# Patient Record
Sex: Male | Born: 1935 | Race: Black or African American | Hispanic: No | Marital: Single | Smoking: Former smoker
Health system: Southern US, Community
[De-identification: ages and names within clinical notes are randomized; demographics above are authoritative.]

## PROBLEM LIST (undated history)

## (undated) DIAGNOSIS — E785 Hyperlipidemia, unspecified: Secondary | ICD-10-CM

## (undated) DIAGNOSIS — H9193 Unspecified hearing loss, bilateral: Secondary | ICD-10-CM

## (undated) DIAGNOSIS — N19 Unspecified kidney failure: Secondary | ICD-10-CM

## (undated) DIAGNOSIS — I10 Essential (primary) hypertension: Secondary | ICD-10-CM

## (undated) DIAGNOSIS — I619 Nontraumatic intracerebral hemorrhage, unspecified: Secondary | ICD-10-CM

## (undated) DIAGNOSIS — R41841 Cognitive communication deficit: Secondary | ICD-10-CM

## (undated) DIAGNOSIS — N289 Disorder of kidney and ureter, unspecified: Secondary | ICD-10-CM

## (undated) DIAGNOSIS — E119 Type 2 diabetes mellitus without complications: Secondary | ICD-10-CM

## (undated) DIAGNOSIS — N390 Urinary tract infection, site not specified: Secondary | ICD-10-CM

## (undated) DIAGNOSIS — M6281 Muscle weakness (generalized): Secondary | ICD-10-CM

## (undated) DIAGNOSIS — D4959 Neoplasm of unspecified behavior of other genitourinary organ: Secondary | ICD-10-CM

## (undated) DIAGNOSIS — H409 Unspecified glaucoma: Secondary | ICD-10-CM

## (undated) DIAGNOSIS — G9340 Encephalopathy, unspecified: Secondary | ICD-10-CM

## (undated) DIAGNOSIS — R1310 Dysphagia, unspecified: Secondary | ICD-10-CM

## (undated) DIAGNOSIS — R600 Localized edema: Secondary | ICD-10-CM

## (undated) DIAGNOSIS — H547 Unspecified visual loss: Secondary | ICD-10-CM

## (undated) DIAGNOSIS — R339 Retention of urine, unspecified: Secondary | ICD-10-CM

## (undated) DIAGNOSIS — R609 Edema, unspecified: Secondary | ICD-10-CM

## (undated) DIAGNOSIS — R269 Unspecified abnormalities of gait and mobility: Secondary | ICD-10-CM

## (undated) HISTORY — DX: Hyperlipidemia, unspecified: E78.5

## (undated) HISTORY — DX: Unspecified hearing loss, bilateral: H91.93

## (undated) HISTORY — DX: Unspecified abnormalities of gait and mobility: R26.9

## (undated) HISTORY — DX: Unspecified visual loss: H54.7

## (undated) HISTORY — DX: Localized edema: R60.0

## (undated) HISTORY — PX: EYE SURGERY: SHX253

## (undated) HISTORY — DX: Unspecified glaucoma: H40.9

## (undated) HISTORY — DX: Edema, unspecified: R60.9

---

## 1998-02-06 ENCOUNTER — Ambulatory Visit (HOSPITAL_COMMUNITY): Admission: RE | Admit: 1998-02-06 | Discharge: 1998-02-06 | Payer: Self-pay | Admitting: Family Medicine

## 1998-11-25 ENCOUNTER — Encounter: Admission: RE | Admit: 1998-11-25 | Discharge: 1999-02-23 | Payer: Self-pay | Admitting: *Deleted

## 2000-05-12 ENCOUNTER — Encounter: Payer: Self-pay | Admitting: *Deleted

## 2000-05-12 ENCOUNTER — Encounter: Admission: RE | Admit: 2000-05-12 | Discharge: 2000-05-12 | Payer: Self-pay | Admitting: *Deleted

## 2000-06-04 ENCOUNTER — Ambulatory Visit (HOSPITAL_COMMUNITY): Admission: RE | Admit: 2000-06-04 | Discharge: 2000-06-04 | Payer: Self-pay | Admitting: Gastroenterology

## 2002-05-16 ENCOUNTER — Encounter: Admission: RE | Admit: 2002-05-16 | Discharge: 2002-05-16 | Payer: Self-pay | Admitting: Internal Medicine

## 2002-05-16 ENCOUNTER — Encounter: Payer: Self-pay | Admitting: Internal Medicine

## 2002-06-09 ENCOUNTER — Encounter: Admission: RE | Admit: 2002-06-09 | Discharge: 2002-06-09 | Payer: Self-pay | Admitting: Internal Medicine

## 2002-06-09 ENCOUNTER — Encounter: Payer: Self-pay | Admitting: Internal Medicine

## 2003-02-22 ENCOUNTER — Encounter: Admission: RE | Admit: 2003-02-22 | Discharge: 2003-02-22 | Payer: Self-pay | Admitting: Internal Medicine

## 2003-02-22 ENCOUNTER — Encounter: Payer: Self-pay | Admitting: Internal Medicine

## 2007-09-07 ENCOUNTER — Encounter: Admission: RE | Admit: 2007-09-07 | Discharge: 2007-09-07 | Payer: Self-pay | Admitting: Internal Medicine

## 2010-12-19 NOTE — Procedures (Signed)
[facility]  Patient:    Kent Mayo, Kent Mayo                    MRN: [id number] Proc. Date: 06/04/00 Adm. Date:  [id number] Attending:  Deneen Harts CC:         Georg Ruddle. Viviann Spare, M.D.   Procedure Report  PROCEDURE:  Colonoscopy.  INDICATIONS:  A 77 year old African-American male undergoing colonoscopy for neoplasia surveillance.  Prior history of sigmoidoscopy with findings of diverticulosis.  Patient with progressive constipation.  Undergoing colonoscopy for neoplasia surveillance.  Family history of colon.  Brother recently deceased with this diagnosis.  DESCRIPTION OF PROCEDURE:  After reviewing the nature of the procedure with the patient, including potential risks and complications, including hemorrhage and perforation, informed consent was obtained was signed.  The patient as premedicated, receiving IV sedation administered IV in divided doses prior to and during the course of the procedure, totalling Versed 5 mg and fentanyl 50 mcg IV.  Using an Olympus standard video colonoscope, the rectum was intubated after digital examination revealed decreased anorectal sphincter tone, but without evidence of other abnormality.  The scope was inserted and easily advanced around the entire length of the colon to the cecum, identified by the appendiceal orifice and ileocecal valve.  Preparation was good throughout. The scope was slowly withdrawn with careful inspection of the entire colon in a retrograde manner, including retroflexed view in the rectal vault.  No neoplasia was identified.  Extensive sigmoid diverticulosis was present. Melanosis coli was present limited to the rectum and mild in extent.  The retroflexed view in the rectal vault was normal.  The colon was decompressed. The scope was withdrawn.  The patient tolerated the procedure without difficulty, being maintained on Datascope monitor and low-flow oxygen throughout.  TIME:   One.  TECHNICAL:  One.  PREPARATION:  Two.  TOTAL SCORE:  Four.  ASSESSMENT: 1. Diverticulosis, extensive sigmoid involvement. 2. Melanosis coli 3. No colorectal neoplasia.  RECOMMENDATIONS: 1. Repeat colonoscopy in five years for ongoing neoplasia surveillance. 2. Annual Hemoccult. 3. MiraLax one capsule in 8-10 fluid ounces of water or juice nightly.    Titrate to achieve daily bowel action. DD:  06/04/00 TD:  06/06/00 Job: [id number]

## 2012-07-22 ENCOUNTER — Other Ambulatory Visit (HOSPITAL_COMMUNITY): Payer: Self-pay | Admitting: Cardiovascular Disease

## 2012-07-22 DIAGNOSIS — R609 Edema, unspecified: Secondary | ICD-10-CM

## 2012-07-25 ENCOUNTER — Ambulatory Visit (HOSPITAL_COMMUNITY)
Admission: RE | Admit: 2012-07-25 | Discharge: 2012-07-25 | Disposition: A | Discharge status: Home / Self Care | Payer: Medicare Other | Source: Ambulatory Visit | Attending: Cardiovascular Disease | Admitting: Cardiovascular Disease

## 2012-07-25 DIAGNOSIS — R609 Edema, unspecified: Secondary | ICD-10-CM | POA: Insufficient documentation

## 2012-07-25 NOTE — Progress Notes (Signed)
BLE venous duplex completed. Kent Mayo  

## 2013-02-22 ENCOUNTER — Observation Stay (HOSPITAL_COMMUNITY): Payer: Medicare Other

## 2013-02-22 ENCOUNTER — Inpatient Hospital Stay (HOSPITAL_COMMUNITY)
Admission: EM | Admit: 2013-02-22 | Discharge: 2013-02-28 | DRG: 066 | Disposition: A | Discharge status: Skilled Nursing Facility | Payer: Medicare Other | Attending: Internal Medicine | Admitting: Internal Medicine

## 2013-02-22 ENCOUNTER — Encounter (HOSPITAL_COMMUNITY): Payer: Self-pay | Admitting: *Deleted

## 2013-02-22 DIAGNOSIS — E119 Type 2 diabetes mellitus without complications: Secondary | ICD-10-CM

## 2013-02-22 DIAGNOSIS — I613 Nontraumatic intracerebral hemorrhage in brain stem: Secondary | ICD-10-CM | POA: Diagnosis present

## 2013-02-22 DIAGNOSIS — E785 Hyperlipidemia, unspecified: Secondary | ICD-10-CM

## 2013-02-22 DIAGNOSIS — I619 Nontraumatic intracerebral hemorrhage, unspecified: Principal | ICD-10-CM | POA: Diagnosis present

## 2013-02-22 DIAGNOSIS — R42 Dizziness and giddiness: Secondary | ICD-10-CM

## 2013-02-22 DIAGNOSIS — H548 Legal blindness, as defined in USA: Secondary | ICD-10-CM | POA: Diagnosis present

## 2013-02-22 DIAGNOSIS — E7800 Pure hypercholesterolemia, unspecified: Secondary | ICD-10-CM | POA: Diagnosis present

## 2013-02-22 DIAGNOSIS — Z8673 Personal history of transient ischemic attack (TIA), and cerebral infarction without residual deficits: Secondary | ICD-10-CM

## 2013-02-22 DIAGNOSIS — I10 Essential (primary) hypertension: Secondary | ICD-10-CM

## 2013-02-22 DIAGNOSIS — H9190 Unspecified hearing loss, unspecified ear: Secondary | ICD-10-CM | POA: Diagnosis present

## 2013-02-22 HISTORY — DX: Essential (primary) hypertension: I10

## 2013-02-22 HISTORY — DX: Type 2 diabetes mellitus without complications: E11.9

## 2013-02-22 LAB — CBC
HCT: 42 % (ref 39.0–52.0)
Platelets: 240 10*3/uL (ref 150–400)
RDW: 13.2 % (ref 11.5–15.5)
WBC: 12.6 10*3/uL — ABNORMAL HIGH (ref 4.0–10.5)

## 2013-02-22 LAB — URINALYSIS, ROUTINE W REFLEX MICROSCOPIC
Hgb urine dipstick: NEGATIVE
Nitrite: NEGATIVE
Specific Gravity, Urine: 1.017 (ref 1.005–1.030)
Urobilinogen, UA: 1 mg/dL (ref 0.0–1.0)

## 2013-02-22 LAB — BASIC METABOLIC PANEL
Chloride: 105 mEq/L (ref 96–112)
Creatinine, Ser: 1.05 mg/dL (ref 0.50–1.35)
GFR calc Af Amer: 73 mL/min — ABNORMAL LOW (ref >90)

## 2013-02-22 LAB — POCT I-STAT TROPONIN I: Troponin i, poc: 0.01 ng/mL (ref 0.00–0.08)

## 2013-02-22 LAB — HEPATIC FUNCTION PANEL
Albumin: 3.8 g/dL (ref 3.5–5.2)
Total Protein: 7.6 g/dL (ref 6.0–8.3)

## 2013-02-22 MED ORDER — ONDANSETRON HCL 4 MG/2ML IJ SOLN: 4.0000 mg | Freq: Three times a day (TID) | INTRAMUSCULAR | Status: DC | PRN

## 2013-02-22 MED ORDER — ALBUTEROL SULFATE (5 MG/ML) 0.5% IN NEBU
2.5000 mg | INHALATION_SOLUTION | RESPIRATORY_TRACT | Status: AC
  Administered 2013-02-23: 2.5 mg via RESPIRATORY_TRACT
  Filled 2013-02-22 (×3): qty 0.5

## 2013-02-22 NOTE — ED Provider Notes (Signed)
History    CSN: [id number] Arrival date & time 02/22/13  1652  None    Chief Complaint  Patient presents with  . Dizziness   (Consider location/radiation/quality/duration/timing/severity/associated sxs/prior Treatment) HPI  Kent Mayo is a 79 y.o. male with past medical history significant for non-insulin-dependent diabetes, hypertension and high cholesterol complaining of onset of dizziness (patient denies vertigo, endorses a lightheaded sensation) onset this a.m. awaking. Lightheaded sensation has been constant, he cannot identify any exacerbating or alleviating factors. He has had no prior similar episodes. Patient had single episode of nonbloody, nonbilious, non coffee ground emesis while in the ED. Patient denies chest pain, palpitations, shortness of breath, change in bowel or bladder habits, pain, fever, rash, dysarthria, ataxia, dysphagia.. Patient does endorse a lower extremity leg swelling this typical for his baseline. Patient denies any prior cardiac history.   PCP Rudean Hitt  Past Medical History  Diagnosis Date  . Diabetes mellitus without complication   . Hypertension    History reviewed. No pertinent past surgical history. No family history on file. History  Substance Use Topics  . Smoking status: Never Smoker   . Smokeless tobacco: Not on file  . Alcohol Use: No    Review of Systems  Constitutional:       Negative except as described in HPI  HENT:       Negative except as described in HPI  Respiratory:       Negative except as described in HPI  Cardiovascular:       Negative except as described in HPI  Gastrointestinal:       Negative except as described in HPI  Genitourinary:       Negative except as described in HPI  Musculoskeletal:       Negative except as described in HPI  Skin:       Negative except as described in HPI  Neurological:       Negative except as described in HPI  All other systems reviewed and are negative.    Allergies   Review of patient's allergies indicates no known allergies.  Home Medications   Current Outpatient Rx  Name  Route  Sig  Dispense  Refill  . Aliskiren-Hydrochlorothiazide (TEKTURNA HCT) 300-25 MG TABS   Oral   Take 1 tablet by mouth daily.         Marland Kitchen linagliptin (TRADJENTA) 5 MG TABS tablet   Oral   Take 5 mg by mouth daily.         . nebivolol (BYSTOLIC) 5 MG tablet   Oral   Take 5 mg by mouth daily.         . rosuvastatin (CRESTOR) 10 MG tablet   Oral   Take 10 mg by mouth daily.         . verapamil (CALAN-SR) 240 MG CR tablet   Oral   Take 240 mg by mouth at bedtime.          BP 148/63  Pulse 60  Temp(Src) 97.8 F (36.6 C) (Oral)  Resp 18  SpO2 95% Physical Exam  Nursing note and vitals reviewed. Constitutional: He is oriented to person, place, and time. He appears well-developed and well-nourished. No distress.  HENT:  Head: Normocephalic.  Mouth/Throat: Oropharynx is clear and moist.  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  Right eye has asymmetric pupils, history of cataract surgery.  Eyes: Conjunctivae and EOM are normal. Pupils are equal, round,  and reactive to light.  Neck: Normal range of motion. No JVD present.  Cardiovascular: Normal rate, regular rhythm and intact distal pulses.   Pulmonary/Chest: Effort normal and breath sounds normal. No stridor. No respiratory distress. He has no wheezes. He has no rales. He exhibits no tenderness.  Abdominal: Soft. Bowel sounds are normal. He exhibits no distension and no mass. There is no tenderness. There is no rebound and no guarding.  Musculoskeletal: Normal range of motion. He exhibits edema.  4+ pitting edema to proximal shin. Equal bilaterally, no warmth or induration. There are chronic skin changes.  Neurological: He is alert and oriented to person, place, and time.  Follows commands, Goal oriented speech, Strength is 5 out of 5x4 extremities,  cranial nerves are grossly intact. Sensation is grossly intact.   Psychiatric: He has a normal mood and affect.    ED Course  Procedures (including critical care time) Labs Reviewed  BASIC METABOLIC PANEL - Abnormal; Notable for the following:    Glucose, Bld 184 (*)    GFR calc non Af Amer 63 (*)    GFR calc Af Amer 73 (*)    All other components within normal limits  CBC - Abnormal; Notable for the following:    WBC 12.6 (*)    All other components within normal limits   No results found.   Date: 02/22/2013 20:32  Rate: 56  Rhythm: sinus bradycardia  QRS Axis: left  Intervals: PR prolonged  ST/T Wave abnormalities: nonspecific ST changes  Conduction Disutrbances:first-degree A-V block   Narrative Interpretation: Upsloping ST elevation in the inferior leads, T-wave inversion in aVL  Old EKG Reviewed: none available   Date: 02/22/2013 21:37  Rate: 57  Rhythm: sinus bradycardia  QRS Axis: left  Intervals: PR prolonged  ST/T Wave abnormalities: nonspecific ST changes  Conduction Disutrbances:first-degree A-V block   Narrative Interpretation: Upsloping ST elevation in the inferior leads, T-wave inversion in aVL  Old EKG Reviewed: none available     1. Light headed     MDM   Filed Vitals:   02/22/13 1658 02/22/13 1930  BP: 148/63 141/58  Pulse: 60 51  Temp: 97.8 F (36.6 C)   TempSrc: Oral   Resp: 18   SpO2: 95% 96%     Kent Mayo is a 79 y.o. male with lightheaded sensation acute onset this a.m. This does not appear to be vertiginous. Patient has no cerebellar signs on neuro exam. EKG shows upsloping ST elevation in the inferior leads, there are no is no prior EKG to compare this to. Troponin is negative. Repeat EKG showed similar pattern. EKG reviewed and discussed with Dr. Jeraldine Loots.  Blood work is unremarkable. Patient has a hyperglycemia with no ketosis or anion gap. He has a mild leukocytosis of 12.6.  This is a shared visit with attending Dr.  Jeraldine Loots who agrees that patient is appropriate for observation admission.  Pt will be admitted to Tele bed by Dr. Toniann Fail who requests  CT head and CXR   Wynetta Emery, PA-C 02/23/13 [phone]

## 2013-02-22 NOTE — ED Notes (Signed)
Pt states went to sleep and woke up feeling dizzy about one hour ago and just feels dizzy.  No other neuro deficits

## 2013-02-23 ENCOUNTER — Encounter (HOSPITAL_COMMUNITY): Payer: Self-pay | Admitting: Internal Medicine

## 2013-02-23 ENCOUNTER — Observation Stay (HOSPITAL_COMMUNITY): Payer: Medicare Other

## 2013-02-23 DIAGNOSIS — I369 Nonrheumatic tricuspid valve disorder, unspecified: Secondary | ICD-10-CM

## 2013-02-23 DIAGNOSIS — E119 Type 2 diabetes mellitus without complications: Secondary | ICD-10-CM | POA: Diagnosis present

## 2013-02-23 DIAGNOSIS — E785 Hyperlipidemia, unspecified: Secondary | ICD-10-CM | POA: Diagnosis present

## 2013-02-23 DIAGNOSIS — R42 Dizziness and giddiness: Secondary | ICD-10-CM

## 2013-02-23 DIAGNOSIS — I10 Essential (primary) hypertension: Secondary | ICD-10-CM | POA: Diagnosis present

## 2013-02-23 DIAGNOSIS — R609 Edema, unspecified: Secondary | ICD-10-CM

## 2013-02-23 LAB — GLUCOSE, CAPILLARY: Glucose-Capillary: 107 mg/dL — ABNORMAL HIGH (ref 70–99)

## 2013-02-23 LAB — CBC
MCH: 31.1 pg (ref 26.0–34.0)
MCV: 89.2 fL (ref 78.0–100.0)
Platelets: 243 10*3/uL (ref 150–400)
RDW: 13.2 % (ref 11.5–15.5)
WBC: 11.1 10*3/uL — ABNORMAL HIGH (ref 4.0–10.5)

## 2013-02-23 LAB — D-DIMER, QUANTITATIVE: D-Dimer, Quant: 0.56 ug/mL-FEU — ABNORMAL HIGH (ref 0.00–0.48)

## 2013-02-23 LAB — TROPONIN I
Troponin I: <0.3 ng/mL (ref <0.30)
Troponin I: <0.3 ng/mL (ref <0.30)

## 2013-02-23 LAB — CREATININE, SERUM: Creatinine, Ser: 0.88 mg/dL (ref 0.50–1.35)

## 2013-02-23 MED ORDER — NEBIVOLOL HCL 5 MG PO TABS
5.0000 mg | ORAL_TABLET | Freq: Every day | ORAL | Status: DC
  Administered 2013-02-25 – 2013-02-28 (×4): 5 mg via ORAL
  Filled 2013-02-23 (×6): qty 1

## 2013-02-23 MED ORDER — ONDANSETRON HCL 4 MG/2ML IJ SOLN: 4.0000 mg | Freq: Four times a day (QID) | INTRAMUSCULAR | Status: DC | PRN

## 2013-02-23 MED ORDER — ALISKIREN FUMARATE 150 MG PO TABS
300.0000 mg | ORAL_TABLET | Freq: Every day | ORAL | Status: DC
  Administered 2013-02-23 – 2013-02-28 (×6): 300 mg via ORAL
  Filled 2013-02-23 (×6): qty 2

## 2013-02-23 MED ORDER — HYDROCHLOROTHIAZIDE 25 MG PO TABS
25.0000 mg | ORAL_TABLET | Freq: Every day | ORAL | Status: DC
  Administered 2013-02-23 – 2013-02-28 (×6): 25 mg via ORAL
  Filled 2013-02-23 (×6): qty 1

## 2013-02-23 MED ORDER — SODIUM CHLORIDE 0.9 % IJ SOLN
3.0000 mL | Freq: Two times a day (BID) | INTRAMUSCULAR | Status: DC
  Administered 2013-02-25 – 2013-02-27 (×4): 3 mL via INTRAVENOUS

## 2013-02-23 MED ORDER — INSULIN ASPART 100 UNIT/ML ~~LOC~~ SOLN
0.0000 [IU] | Freq: Three times a day (TID) | SUBCUTANEOUS | Status: DC
  Administered 2013-02-23 – 2013-02-24 (×4): 2 [IU] via SUBCUTANEOUS
  Administered 2013-02-24: 1 [IU] via SUBCUTANEOUS
  Administered 2013-02-25: 3 [IU] via SUBCUTANEOUS
  Administered 2013-02-25 – 2013-02-27 (×6): 2 [IU] via SUBCUTANEOUS
  Administered 2013-02-28 (×2): 1 [IU] via SUBCUTANEOUS

## 2013-02-23 MED ORDER — SODIUM CHLORIDE 0.9 % IJ SOLN
3.0000 mL | Freq: Two times a day (BID) | INTRAMUSCULAR | Status: DC
  Administered 2013-02-23 – 2013-02-28 (×11): 3 mL via INTRAVENOUS

## 2013-02-23 MED ORDER — ATORVASTATIN CALCIUM 20 MG PO TABS
20.0000 mg | ORAL_TABLET | Freq: Every day | ORAL | Status: DC
  Administered 2013-02-23 – 2013-02-27 (×5): 20 mg via ORAL
  Filled 2013-02-23 (×6): qty 1

## 2013-02-23 MED ORDER — ACETAMINOPHEN 325 MG PO TABS: 650.0000 mg | ORAL_TABLET | Freq: Four times a day (QID) | ORAL | Status: DC | PRN

## 2013-02-23 MED ORDER — ALISKIREN-HYDROCHLOROTHIAZIDE 300-25 MG PO TABS: 1.0000 | ORAL_TABLET | Freq: Every day | ORAL | Status: DC

## 2013-02-23 MED ORDER — VERAPAMIL HCL ER 240 MG PO TBCR
240.0000 mg | EXTENDED_RELEASE_TABLET | Freq: Every day | ORAL | Status: DC
  Administered 2013-02-24 – 2013-02-27 (×4): 240 mg via ORAL
  Filled 2013-02-23 (×7): qty 1

## 2013-02-23 MED ORDER — ONDANSETRON HCL 4 MG PO TABS: 4.0000 mg | ORAL_TABLET | Freq: Four times a day (QID) | ORAL | Status: DC | PRN

## 2013-02-23 MED ORDER — ACETAMINOPHEN 650 MG RE SUPP: 650.0000 mg | Freq: Four times a day (QID) | RECTAL | Status: DC | PRN

## 2013-02-23 MED ORDER — ENOXAPARIN SODIUM 60 MG/0.6ML ~~LOC~~ SOLN
60.0000 mg | SUBCUTANEOUS | Status: DC
  Administered 2013-02-23 – 2013-02-24 (×2): 60 mg via SUBCUTANEOUS
  Filled 2013-02-23 (×2): qty 0.6

## 2013-02-23 MED ORDER — LINAGLIPTIN 5 MG PO TABS
5.0000 mg | ORAL_TABLET | Freq: Every day | ORAL | Status: DC
  Administered 2013-02-23 – 2013-02-28 (×6): 5 mg via ORAL
  Filled 2013-02-23 (×6): qty 1

## 2013-02-23 NOTE — ED Provider Notes (Signed)
  This was a shared visit with a mid-level provided (NP or PA).  Throughout the patient's course I was available for consultation/collaboration.  I saw the ECG (if appropriate), relevant labs and studies - I agree with the interpretation.  On my exam the patient was in no distress.  However, with his description of pneumonia or syncope he was admitted for further evaluation and management      Gerhard Munch, MD 02/23/13 1439

## 2013-02-23 NOTE — Progress Notes (Signed)
UR Completed.  Karon Heckendorn Jane [phone] 02/23/2013  

## 2013-02-23 NOTE — Care Management Note (Signed)
    Page 1 of 2   02/28/2013     3:35:47 PM   CARE MANAGEMENT NOTE 02/28/2013  Patient:  Kent Mayo, Kent Mayo   Account Number:  [id number]  Date Initiated:  02/23/2013  Documentation initiated by:  Sedra Morfin  Subjective/Objective Assessment:   PT ADM ON 02/22/13 WITH DIZZINESS, WEAKNESS.  PTA, PT LIVES ALONE, AND HAS BEEN INDEPENDENT.     Action/Plan:   P.T.  CONSULT TODAY; RECOMMENDATION IS FOR SNF PLACEMENT. WILL CONSULT CSW TO FACILITATE POSSIBLE DC TO SNF WHEN MEDICALLY STABLE.   Anticipated DC Date:  02/28/2013   Anticipated DC Plan:  SKILLED NURSING FACILITY  In-house referral  Clinical Social Worker      DC Planning Services  CM consult      Choice offered to / List presented to:             Status of service:  Completed, signed off Medicare Important Message given?   (If response is "NO", the following Medicare IM given date fields will be blank) Date Medicare IM given:   Date Additional Medicare IM given:    Discharge Disposition:  SKILLED NURSING FACILITY  Per UR Regulation:  Reviewed for med. necessity/level of care/duration of stay  If discussed at Long Length of Stay Meetings, dates discussed:    Comments:  02/28/13 Royce Stegman,RN,BSN [phone] PT DISCHARGED TODAY TO [facility], PER CSW ARRANGEMENTS.  02/27/13 Marcelle Bebout,RN,BSN [phone] PT MET IP CRITERIA SINCE 7/25 AND HAS 3 DAY IP STAY.  WILL BE ABLE TO GO TO SNF.  SPOKE WITH BROTHER JULIUS, AND HE STATES HE WILL SPEAK WITH PT ABOUT GOING TO SNF.  HE IS AGREEABLE TO THE PLAN, AND THINKS PT WILL BE AS WELL.  CSW TO FOLLOW UP WITH BROTHER THIS AM.  02/24/13 Ebony Rickel,RN,BSN [phone] SPOKE WITH PT'S BROTHER, JULIUS, AT BEDSIDE TO DISCUSS DC PLANS.  PT STATES HE TOO, IS CONCERNED ABOUT PT FALLING AT HOME ALONE.  HE HAS TRIED IN THE PAST TO GET PT TO AGREE TO ALF, WITHOUT SUCCESS.  HE CHECKS ON PT FREQUENTLY THROUGHOUT THE DAY.  PT HAS MEDICARE, AND AT THIS POINT IS OBSERVATION STATUS, SO IS NOT  ELIGIBLE FOR SNF PLACEMENT. BROTHER GIVEN LIST OF HOME CARE PROVIDERS AND PRIVATE DUTY SITTERS.  NOTED PT BEING WORKED UP FOR POSSIBLE STROKE. WILL FOLLOW UP WITH MED MGMT ON POSS CHANGE TO INPT STATUS DUE TO CHANGE IN MEDICAL CONDITION.

## 2013-02-23 NOTE — Evaluation (Signed)
Physical Therapy Evaluation Patient Details Name: Kent Mayo MRN: [id number] DOB: 1926/09/12 Today's Date: 02/23/2013 Time: 6578-4696 PT Time Calculation (min): 23 min  PT Assessment / Plan / Recommendation History of Present Illness  79 y.o. male admitted with dizziness, chronic LE edema. Orthostatics negative, CT head negative, Dopplers negative.  Clinical Impression  *Pt ambulated 150' with min A to negotiate obstacles 2* very poor vision. Pt reports living alone. May need higher level of care if family unable to assist. SNF vs. HHPT depending on progress and assistance available from family. Pt would benefit from acute PT to maximize safety and independence with mobility.  **    PT Assessment  Patient needs continued PT services    Follow Up Recommendations  SNF;Supervision for mobility/OOB    Does the patient have the potential to tolerate intense rehabilitation      Barriers to Discharge Decreased caregiver support      Equipment Recommendations  Rolling walker with 5" wheels    Recommendations for Other Services OT consult   Frequency Min 3X/week    Precautions / Restrictions Precautions Precautions: Fall Precaution Comments: very poor vision Restrictions Weight Bearing Restrictions: No   Pertinent Vitals/Pain *pt denies pain**      Mobility  Bed Mobility Bed Mobility: Not assessed Transfers Transfers: Sit to Stand;Stand to Sit Sit to Stand: 3: Mod assist;From bed Stand to Sit: 4: Min assist;To chair/3-in-1;With armrests Details for Transfer Assistance: VCs hand placement Ambulation/Gait Ambulation/Gait Assistance: 4: Min assist Ambulation Distance (Feet): 150 Feet Assistive device: Rolling walker Ambulation/Gait Assistance Details: Verbal and visual cues to negotiate obstacles 2* poor vision Gait Pattern: Step-through pattern    Exercises     PT Diagnosis: Generalized weakness;Difficulty walking  PT Problem List: Decreased mobility PT Treatment  Interventions: DME instruction;Gait training;Stair training;Therapeutic activities;Therapeutic exercise;Patient/family education     PT Goals(Current goals can be found in the care plan section) Acute Rehab PT Goals Patient Stated Goal: none stated PT Goal Formulation: With patient Time For Goal Achievement: 03/09/13 Potential to Achieve Goals: Good  Visit Information  Last PT Received On: 02/23/13 Assistance Needed: +1 History of Present Illness: 79 y.o. male admitted with dizziness, chronic LE edema. Orthostatics negative, CT head negative, Dopplers negative.       Prior Functioning  Home Living Family/patient expects to be discharged to:: Private residence Living Arrangements: Alone Type of Home: Apartment Home Access: Stairs to enter Entrance Stairs-Number of Steps: flight Home Equipment: Cane - single point Prior Function Level of Independence: Independent with assistive device(s) Communication Communication: HOH    Cognition  Cognition Arousal/Alertness: Awake/alert Behavior During Therapy: WFL for tasks assessed/performed Overall Cognitive Status: Within Functional Limits for tasks assessed    Extremity/Trunk Assessment Upper Extremity Assessment Upper Extremity Assessment: Overall WFL for tasks assessed Lower Extremity Assessment Lower Extremity Assessment: Overall WFL for tasks assessed Cervical / Trunk Assessment Cervical / Trunk Assessment: Normal   Balance Balance Balance Assessed: Yes Static Sitting Balance Static Sitting - Balance Support: Feet supported;Bilateral upper extremity supported Static Sitting - Level of Assistance: 7: Independent Static Sitting - Comment/# of Minutes: 2  End of Session PT - End of Session Equipment Utilized During Treatment: Gait belt Activity Tolerance: Patient tolerated treatment well Patient left: in chair;with chair alarm set;with call bell/phone within reach Nurse Communication: Mobility status  GP Functional  Assessment Tool Used: clinical judgement Functional Limitation: Mobility: Walking and moving around Mobility: Walking and Moving Around Current Status [phone]): At least 20 percent but less than 40  percent impaired, limited or restricted Mobility: Walking and Moving Around Goal Status [phone]): At least 1 percent but less than 20 percent impaired, limited or restricted   Tamala Ser 02/23/2013, 2:28 PM [phone]

## 2013-02-23 NOTE — Progress Notes (Signed)
[facility] PROGRESS NOTE  Kent Mayo ZOX:[id number] DOB: 1927-04-22 DOA: 02/22/2013 PCP: No primary provider on file.  Assessment/Plan: Dizziness - orthostatic changes negative. MRI brain could not be done because of metal in the eye.  and given the abnormal EKG cycle cardiac markers and check 2-D echo and also d-dimer. Closely observe in telemetry. Abnormal EKG (No old EKG to compare) - denies any chest pain.Cycle cardiac markers and check 2-D echo. Patient does not have any chest pain.  Diabetes mellitus type 2 - continue present medications. Chronic lower extremity edema - check Dopplers to rule out DVT. Hypertension - continue present medications. Hyperlipidemia - continue present medications.   Code Status: full code Family Communication: none at bedside Disposition Plan: pending.    Consultants:  none  Procedures:  MRI brain.  Antibiotics:  none  HPI/Subjective: No dizzines. No chest pain.   Objective: Filed Vitals:   02/23/13 0247 02/23/13 0256 02/23/13 0426 02/23/13 0455  BP: 189/83 214/94  153/68  Pulse: 71   74  Temp:    97.8 F (36.6 C)  TempSrc:    Oral  Resp: 18   18  Height:      Weight:      SpO2: 98% 95% 96% 97%    Intake/Output Summary (Last 24 hours) at 02/23/13 1256 Last data filed at 02/23/13 1100  Gross per 24 hour  Intake    240 ml  Output    900 ml  Net   -660 ml   Filed Weights   02/23/13 0055 02/23/13 0128  Weight: 121.2 kg (267 lb 3.2 oz) 121.2 kg (267 lb 3.2 oz)    Exam:     Data Reviewed: Basic Metabolic Panel:  Recent Labs Lab 02/22/13 1713 02/23/13 0138  NA 138  --   K 3.8  --   CL 105  --   CO2 20  --   GLUCOSE 184*  --   BUN 20  --   CREATININE 1.05 0.88  CALCIUM 9.7  --    Liver Function Tests:  Recent Labs Lab 02/22/13 2040  AST 24  ALT 16  ALKPHOS 66  BILITOT 0.5  PROT 7.6  ALBUMIN 3.8   No results found for this basename: LIPASE, AMYLASE,  in the last 168 hours No results found  for this basename: AMMONIA,  in the last 168 hours CBC:  Recent Labs Lab 02/22/13 1713 02/23/13 0138  WBC 12.6* 11.1*  HGB 14.4 14.7  HCT 42.0 42.2  MCV 89.2 89.2  PLT 240 243   Cardiac Enzymes:  Recent Labs Lab 02/23/13 0138 02/23/13 0827  TROPONINI <0.30 <0.30   BNP (last 3 results)  Recent Labs  02/23/13 0138  PROBNP 212.4   CBG:  Recent Labs Lab 02/23/13 0717  GLUCAP 115*    No results found for this or any previous visit (from the past 240 hour(s)).   Studies: Dg Eye Foreign Body  02/23/2013   *RADIOLOGY REPORT*  Clinical Data: Question foreign body in the region orbits.  ORBITS FOR FOREIGN BODY - 2 VIEW  Comparison: None.  Findings: Prior right orbital surgery with prostatic structure in place.  Radiopaque structure left orbit.  Etiology indeterminate.  The patient is not safe for MR scanning until these objects have been cleared by surgical notes detailing what was placed and then clearing placed object as MR safe.  IMPRESSION: The patient is NOT cleared for MR imaging.  Please see above.   Original Report Authenticated By: Viviann Spare  Constance Goltz, M.D.   Dg Chest 2 View  02/22/2013   *RADIOLOGY REPORT*  Clinical Data: Shortness of breath.  CHEST - 2 VIEW  Comparison: Report of two-view chest x-ray 02/22/2003.  Findings: The heart is mildly enlarged.  The appearance is exaggerated by low lung volumes.  No focal airspace disease is present.  The visualized soft tissues and bony thorax are unremarkable.  IMPRESSION:  1.  Borderline cardiomegaly without failure. 2.  No acute cardiopulmonary disease.   Original Report Authenticated By: Marin Roberts, M.D.   Ct Head Wo Contrast  02/23/2013   *RADIOLOGY REPORT*  Clinical Data: Severe headache.  The patient woke up feeling dizzy 1 hour ago.  CT HEAD WITHOUT CONTRAST  Technique:  Contiguous axial images were obtained from the base of the skull through the vertex without contrast.  Comparison: Report of MRI brain without and  with contrast at [facility] 06/09/2002.  Findings: Moderate generalized atrophy and white matter disease is evident.  No acute cortical infarct, hemorrhage, or mass lesion is present.  The ventricles are proportionate to the degree of atrophy.  No significant extra-axial fluid collection is present.  The paranasal sinuses and mastoid air cells are clear.  The osseous skull is intact.  Scleral banding is present on the right.  A focal density along the left globe is small metallic fragment.  IMPRESSION:  1.  Age advanced atrophy and white matter disease likely reflects the sequelae of chronic microvascular ischemia. 2.  No acute intracranial abnormality.   Original Report Authenticated By: Marin Roberts, M.D.    Scheduled Meds: . aliskiren  300 mg Oral Daily   And  . hydrochlorothiazide  25 mg Oral Daily  . atorvastatin  20 mg Oral q1800  . enoxaparin (LOVENOX) injection  60 mg Subcutaneous Q24H  . insulin aspart  0-9 Units Subcutaneous TID WC  . linagliptin  5 mg Oral Daily  . nebivolol  5 mg Oral Daily  . sodium chloride  3 mL Intravenous Q12H  . sodium chloride  3 mL Intravenous Q12H  . verapamil  240 mg Oral QHS   Continuous Infusions:   Principal Problem:   Dizziness and giddiness Active Problems:   HTN (hypertension)   Diabetes mellitus   Hyperlipidemia        Kent Mayo  [facility] Pager [phone]. If 7PM-7AM, please contact night-coverage at [email or web address], password [facility] 02/23/2013, 12:56 PM  LOS: 1 day

## 2013-02-23 NOTE — H&P (Addendum)
[facility] History and Physical  Kent Mayo JYN:[id number] DOB: 06-Nov-1926 DOA: 02/22/2013  Referring physician: ER physician. PCP: No primary provider on file. Dr. Cindee Lame.  Chief Complaint: Dizziness.  HPI: Kent Mayo is a 79 y.o. male history of diabetes mellitus type 2, hypertension and hyperlipidemia started experiencing dizziness from yesterday morning. Patient states that he has been feeling dizzy on lying and standing. Denies any associated hearing loss or tinnitus or any loss of consciousness or palpitations. Denies any nausea vomiting abdominal pain chest pain or shortness of breath. In the ER patient was found to be not orthostatic. CT head was negative for anything acute. EKG shows sinus rhythm with ST-T changes which were concerning but upsloping and patient does not have any chest pain and troponins were negative. Patient has been admitted for further management. Patient also has chronic lower extremity edema.   Review of Systems: As presented in the history of presenting illness, rest negative.  Past Medical History  Diagnosis Date  . Diabetes mellitus without complication   . Hypertension    Past Surgical History  Procedure Laterality Date  . Eye surgery     Social History:  reports that he has never smoked. He does not have any smokeless tobacco history on file. He reports that he does not drink alcohol or use illicit drugs. home.  where does patient live--  can do ADLs.  Can patient participate in ADLs?  No Known Allergies  Family History  Problem Relation Age of Onset  . Hypertension Mother   . Hypertension Father   . Diabetes Mellitus II Brother       Prior to Admission medications   Medication Sig Start Date End Date Taking? Authorizing Provider  Aliskiren-Hydrochlorothiazide (TEKTURNA HCT) 300-25 MG TABS Take 1 tablet by mouth daily.   Yes Historical Provider, MD  linagliptin (TRADJENTA) 5 MG TABS tablet Take 5 mg by mouth daily.   Yes  Historical Provider, MD  nebivolol (BYSTOLIC) 5 MG tablet Take 5 mg by mouth daily.   Yes Historical Provider, MD  rosuvastatin (CRESTOR) 10 MG tablet Take 10 mg by mouth daily.   Yes Historical Provider, MD  verapamil (CALAN-SR) 240 MG CR tablet Take 240 mg by mouth at bedtime.   Yes Historical Provider, MD   Physical Exam: Filed Vitals:   02/22/13 2130 02/22/13 2200 02/23/13 0001 02/23/13 0055  BP: 148/68 140/82 138/83 189/83  Pulse: 54 55 60 64  Temp:    97.3 F (36.3 C)  TempSrc:      Resp: 18 19 18    Height:    6' (1.829 m)  Weight:    121.2 kg (267 lb 3.2 oz)  SpO2: 99% 97% 97% 98%     General:  Well-developed well-nourished.  Eyes: Anicteric no pallor. Left cornea is opaque.   ENT: No discharge from ears eyes nose mouth.  Neck:  no mass felt.   Cardiovascular:  S1-S2 heard.   Respiratory:  no rhonchi or crepitations.  Abdomen: Soft nontender bowel sounds present.  Skin:  chronic skin changes in the lower extremity.   Musculoskeletal:  bilateral lower extremity edema.   Psychiatric:  appears normal.   Neurologic: are e oriented to time place and person. Moves all extremities.   Labs on Admission:  Basic Metabolic Panel:  Recent Labs Lab 02/22/13 1713  NA 138  K 3.8  CL 105  CO2 20  GLUCOSE 184*  BUN 20  CREATININE 1.05  CALCIUM 9.7   Liver Function  Tests:  Recent Labs Lab 02/22/13 2040  AST 24  ALT 16  ALKPHOS 66  BILITOT 0.5  PROT 7.6  ALBUMIN 3.8   No results found for this basename: LIPASE, AMYLASE,  in the last 168 hours No results found for this basename: AMMONIA,  in the last 168 hours CBC:  Recent Labs Lab 02/22/13 1713  WBC 12.6*  HGB 14.4  HCT 42.0  MCV 89.2  PLT 240   Cardiac Enzymes: No results found for this basename: CKTOTAL, CKMB, CKMBINDEX, TROPONINI,  in the last 168 hours  BNP (last 3 results) No results found for this basename: PROBNP,  in the last 8760 hours CBG: No results found for this basename:  GLUCAP,  in the last 168 hours  Radiological Exams on Admission: Dg Chest 2 View  02/22/2013   *RADIOLOGY REPORT*  Clinical Data: Shortness of breath.  CHEST - 2 VIEW  Comparison: Report of two-view chest x-ray 02/22/2003.  Findings: The heart is mildly enlarged.  The appearance is exaggerated by low lung volumes.  No focal airspace disease is present.  The visualized soft tissues and bony thorax are unremarkable.  IMPRESSION:  1.  Borderline cardiomegaly without failure. 2.  No acute cardiopulmonary disease.   Original Report Authenticated By: Marin Roberts, M.D.   Ct Head Wo Contrast  02/23/2013   *RADIOLOGY REPORT*  Clinical Data: Severe headache.  The patient woke up feeling dizzy 1 hour ago.  CT HEAD WITHOUT CONTRAST  Technique:  Contiguous axial images were obtained from the base of the skull through the vertex without contrast.  Comparison: Report of MRI brain without and with contrast at [facility] 06/09/2002.  Findings: Moderate generalized atrophy and white matter disease is evident.  No acute cortical infarct, hemorrhage, or mass lesion is present.  The ventricles are proportionate to the degree of atrophy.  No significant extra-axial fluid collection is present.  The paranasal sinuses and mastoid air cells are clear.  The osseous skull is intact.  Scleral banding is present on the right.  A focal density along the left globe is small metallic fragment.  IMPRESSION:  1.  Age advanced atrophy and white matter disease likely reflects the sequelae of chronic microvascular ischemia. 2.  No acute intracranial abnormality.   Original Report Authenticated By: Marin Roberts, M.D.    EKG: Independently reviewed.  normal sinus rhythm with upgoing ST elevations in anterior leads. No old EKG to compare.  Assessment/Plan Principal Problem:   Dizziness and giddiness Active Problems:   HTN (hypertension)   Diabetes mellitus   Hyperlipidemia   1. Dizziness  - check orthostatic  changes. Check MRI brain and given the abnormal EKG cycle cardiac markers and check 2-D echo and also d-dimer. Closely observe in telemetry.  2. Abnormal EKG (No old EKG to compare)  - denies any chest pain.Cycle cardiac markers and check 2-D echo. Patient does not have any chest pain.  3. Diabetes mellitus type 2  - continue present medications.  4. Chronic lower extremity edema - check Dopplers to rule out DVT. 5. Hypertension - continue present medications. 6. Hyperlipidemia - continue present medications.    Code Status:  full code.   Family Communication:  none.   Disposition Plan:  admit for observation.     Lacye Mccarn N. [facility] Pager [phone].   If 7PM-7AM, please contact night-coverage [email or web address] Password [email or web address] 02/23/2013, 1:28 AM

## 2013-02-23 NOTE — Progress Notes (Signed)
  Echocardiogram 2D Echocardiogram has been performed.  Cathie Beams 02/23/2013, 9:54 AM

## 2013-02-23 NOTE — Progress Notes (Signed)
PT Cancellation Note  Patient Details Name: Kent Mayo MRN: [id number] DOB: Mar 12, 1927   Cancelled Treatment:    Reason Eval/Treat Not Completed: Medical issues which prohibited therapy (BLE Dopplers pending. Will follow. )   Tamala Ser 02/23/2013, 8:47 AM [phone]

## 2013-02-23 NOTE — Progress Notes (Signed)
Pt for MR of brain. Noted to have radiopaque foreign object in left eye. Per pt, had cataract surgery 5-6 yrs ago at [facility]. States since that surgery, has not had any vision in that eye. He is not sure exactly what was done then and does not appear to be aware there is retained foreign object in that eye since surgery. Denies pain in either eye/orbit at this time.  MRI was discontinued. Hx was communicated to reading Radiologist as well as ordering MD.

## 2013-02-23 NOTE — Progress Notes (Signed)
*  PRELIMINARY RESULTS* Vascular Ultrasound Lower extremity venous duplex has been completed.  Preliminary findings: Technically difficult study. No obvious evidence of DVT bilaterally.    Farrel Demark, RDMS, RVT  02/23/2013, 9:21 AM

## 2013-02-24 ENCOUNTER — Observation Stay (HOSPITAL_COMMUNITY): Payer: Medicare Other

## 2013-02-24 DIAGNOSIS — I613 Nontraumatic intracerebral hemorrhage in brain stem: Secondary | ICD-10-CM | POA: Diagnosis present

## 2013-02-24 DIAGNOSIS — I619 Nontraumatic intracerebral hemorrhage, unspecified: Principal | ICD-10-CM

## 2013-02-24 LAB — GLUCOSE, CAPILLARY
Glucose-Capillary: 154 mg/dL — ABNORMAL HIGH (ref 70–99)
Glucose-Capillary: 131 mg/dL — ABNORMAL HIGH (ref 70–99)
Glucose-Capillary: 179 mg/dL — ABNORMAL HIGH (ref 70–99)
Glucose-Capillary: 157 mg/dL — ABNORMAL HIGH (ref 70–99)

## 2013-02-24 NOTE — Progress Notes (Signed)
[facility] PROGRESS NOTE  Kent CLAUSEN ION:[id number] DOB: Nov 21, 1926 DOA: 02/22/2013 PCP: No primary provider on file.  Assessment/Plan: Dizziness - orthostatic changes negative. MRI brain could not be done because of metal in the eye, repeat CT done and shows small hemorrahge in the pons. Neurology consulted for further recommendations.   given the abnormal EKG, cardiac markers done and were negative.   2 d echo showed severe LVH.  Closely observe in telemetry. denies any chest paiN Diabetes mellitus type 2 - continue present medications.  CBG (last 3)   Recent Labs  02/23/13 2108 02/24/13 0659 02/24/13 1115  GLUCAP 107* 154* 131*     Chronic lower extremity edema -  Dopplers to rule out DVT DONE and negative. .  Hypertension - continue present medications.  Hyperlipidemia - continue present medications.   Code Status: full code Family Communication: none at bedside Disposition Plan: SNF placement.    Consultants:  neurology  Procedures:  CT head. Marland Kitchen  Antibiotics:  none  HPI/Subjective: No dizzines. No chest pain.   Objective: Filed Vitals:   02/23/13 2042 02/24/13 0404 02/24/13 0802 02/24/13 1100  BP: 148/69 168/71 162/61 158/78  Pulse: 56 55 68 61  Temp: 98.4 F (36.9 C) 98 F (36.7 C) 97.8 F (36.6 C) 97.9 F (36.6 C)  TempSrc: Oral Oral Oral Oral  Resp: 18 18 18 18   Height:      Weight:      SpO2: 100% 96% 99% 99%    Intake/Output Summary (Last 24 hours) at 02/24/13 1239 Last data filed at 02/24/13 0300  Gross per 24 hour  Intake    240 ml  Output    400 ml  Net   -160 ml   Filed Weights   02/23/13 0055 02/23/13 0128  Weight: 121.2 kg (267 lb 3.2 oz) 121.2 kg (267 lb 3.2 oz)    Exam: gen alert afebrile comfortable CVS s1s2  Lungs clear Abdomen soft NT ND BS+ Extremities : no pedal edema.     Data Reviewed: Basic Metabolic Panel:  Recent Labs Lab 02/22/13 1713 02/23/13 0138  NA 138  --   K 3.8  --   CL 105  --    CO2 20  --   GLUCOSE 184*  --   BUN 20  --   CREATININE 1.05 0.88  CALCIUM 9.7  --    Liver Function Tests:  Recent Labs Lab 02/22/13 2040  AST 24  ALT 16  ALKPHOS 66  BILITOT 0.5  PROT 7.6  ALBUMIN 3.8   No results found for this basename: LIPASE, AMYLASE,  in the last 168 hours No results found for this basename: AMMONIA,  in the last 168 hours CBC:  Recent Labs Lab 02/22/13 1713 02/23/13 0138  WBC 12.6* 11.1*  HGB 14.4 14.7  HCT 42.0 42.2  MCV 89.2 89.2  PLT 240 243   Cardiac Enzymes:  Recent Labs Lab 02/23/13 0138 02/23/13 0827 02/23/13 1350  TROPONINI <0.30 <0.30 <0.30   BNP (last 3 results)  Recent Labs  02/23/13 0138  PROBNP 212.4   CBG:  Recent Labs Lab 02/23/13 1440 02/23/13 1633 02/23/13 2108 02/24/13 0659 02/24/13 1115  GLUCAP 200* 180* 107* 154* 131*    No results found for this or any previous visit (from the past 240 hour(s)).   Studies: Dg Eye Foreign Body  02/23/2013   *RADIOLOGY REPORT*  Clinical Data: Question foreign body in the region orbits.  ORBITS FOR FOREIGN BODY - 2 VIEW  Comparison: None.  Findings: Prior right orbital surgery with prostatic structure in place.  Radiopaque structure left orbit.  Etiology indeterminate.  The patient is not safe for MR scanning until these objects have been cleared by surgical notes detailing what was placed and then clearing placed object as MR safe.  IMPRESSION: The patient is NOT cleared for MR imaging.  Please see above.   Original Report Authenticated By: [name], M.D.   Dg Chest 2 View  02/22/2013   *RADIOLOGY REPORT*  Clinical Data: Shortness of breath.  CHEST - 2 VIEW  Comparison: Report of two-view chest x-ray 02/22/2003.  Findings: The heart is mildly enlarged.  The appearance is exaggerated by low lung volumes.  No focal airspace disease is present.  The visualized soft tissues and bony thorax are unremarkable.  IMPRESSION:  1.  Borderline cardiomegaly without failure. 2.  No  acute cardiopulmonary disease.   Original Report Authenticated By: Marin Roberts, M.D.   Ct Head Wo Contrast  02/24/2013   *RADIOLOGY REPORT*  Clinical Data: Dizziness when lying down.  CT HEAD WITHOUT CONTRAST  Technique:  Contiguous axial images were obtained from the base of the skull through the vertex without contrast.  Comparison: 02/22/2013 CT.  Findings: Radiopaque structures in the region of the orbits consistent with prior surgical implant on the right and possible metallic structure on the left.  Mild exophthalmos.  Question artifact versus tiny 5mm hemorrhage superior left paracentral pons (series 2 image 11).  No other findings of intracranial hemorrhage or acute thrombotic infarct.  Global atrophy and small vessel disease type changes.  Vascular calcifications.  IMPRESSION: Question artifact versus tiny 5mm hemorrhage superior left paracentral pons (series 2 image 11).  This has been made a PRA call report utilizing dashboard call feature.   Original Report Authenticated By: [name], M.D.   Ct Head Wo Contrast  02/23/2013   *RADIOLOGY REPORT*  Clinical Data: Severe headache.  The patient woke up feeling dizzy 1 hour ago.  CT HEAD WITHOUT CONTRAST  Technique:  Contiguous axial images were obtained from the base of the skull through the vertex without contrast.  Comparison: Report of MRI brain without and with contrast at [facility] 06/09/2002.  Findings: Moderate generalized atrophy and white matter disease is evident.  No acute cortical infarct, hemorrhage, or mass lesion is present.  The ventricles are proportionate to the degree of atrophy.  No significant extra-axial fluid collection is present.  The paranasal sinuses and mastoid air cells are clear.  The osseous skull is intact.  Scleral banding is present on the right.  A focal density along the left globe is small metallic fragment.  IMPRESSION:  1.  Age advanced atrophy and white matter disease likely reflects the  sequelae of chronic microvascular ischemia. 2.  No acute intracranial abnormality.   Original Report Authenticated By: Marin Roberts, M.D.    Scheduled Meds: . aliskiren  300 mg Oral Daily   And  . hydrochlorothiazide  25 mg Oral Daily  . atorvastatin  20 mg Oral q1800  . enoxaparin (LOVENOX) injection  60 mg Subcutaneous Q24H  . insulin aspart  0-9 Units Subcutaneous TID WC  . linagliptin  5 mg Oral Daily  . nebivolol  5 mg Oral Daily  . sodium chloride  3 mL Intravenous Q12H  . sodium chloride  3 mL Intravenous Q12H  . verapamil  240 mg Oral QHS   Continuous Infusions:   Principal Problem:   Dizziness and giddiness Active Problems:  HTN (hypertension)   Diabetes mellitus   Hyperlipidemia        Tysheem Accardo  [facility] Pager [phone]. If 7PM-7AM, please contact night-coverage at [email or web address], password [facility] 02/24/2013, 12:39 PM  LOS: 2 days

## 2013-02-24 NOTE — Consult Note (Signed)
Referring Physician: Dr. Blake Divine    Chief Complaint:  Acute onset of vertigo.  HPI: Kent Mayo is an 79 y.o. male history of hypertension, hyperlipidemia and diabetes mellitus who experienced acute onset of severe vertigo on the afternoon of 02/22/2013. Patient subsequently had nausea and vomiting after arriving in the emergency room. No focal deficits were noted regarding his extremities. He had no changes in speech. CT scan of his head today showed small area of increased density involving the left pons indicative of possible acute hemorrhage. His NIH stroke score is 0. She's been taking aspirin 81 mg per day. He indicates he had a small stroke he thinks maybe 20 years ago.  LSN: 02/22/2013, afternoon. tPA Given: No: Beyond time under for treatment consideration mRankin:  Past Medical History  Diagnosis Date  . Diabetes mellitus without complication   . Hypertension     Family History  Problem Relation Age of Onset  . Hypertension Mother   . Hypertension Father   . Diabetes Mellitus II Brother      Medications:  I have reviewed the patient's current medications. Scheduled: . aliskiren  300 mg Oral Daily   And  . hydrochlorothiazide  25 mg Oral Daily  . atorvastatin  20 mg Oral q1800  . insulin aspart  0-9 Units Subcutaneous TID WC  . linagliptin  5 mg Oral Daily  . nebivolol  5 mg Oral Daily  . sodium chloride  3 mL Intravenous Q12H  . sodium chloride  3 mL Intravenous Q12H  . 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 ENT ROS: negative for - epistaxis, nasal discharge, oral lesions,  sore throat Allergy and Immunology ROS: negative for - hives or itchy/watery eyes Hematological and Lymphatic ROS: Positive for chronic lymphedema involving lower extremities Endocrine ROS: negative for - galactorrhea, hair pattern changes, polydipsia/polyuria or temperature intolerance Respiratory ROS: negative for - cough, hemoptysis, shortness of breath or wheezing Cardiovascular ROS: negative for - chest pain, dyspnea on exertion, edema or irregular heartbeat Gastrointestinal ROS: negative for - abdominal pain, diarrhea, hematemesis, nausea/vomiting or stool incontinence Genito-Urinary ROS: negative for - dysuria, hematuria, incontinence or urinary frequency/urgency Musculoskeletal ROS: negative for - joint swelling or muscular weakness Neurological ROS: as noted in HPI Dermatological ROS: Positive for chronic changes associated with chronic lymphedema  Physical Examination: Blood pressure 158/78, pulse 61, temperature 97.9 F (36.6 C), temperature source Oral, resp. rate 18, height 6' (1.829 m), weight 121.2 kg (267 lb 3.2 oz), SpO2 99.00%.  Neurologic Examination: Mental Status: Alert, oriented, thought content appropriate.  Speech fluent without evidence of aphasia. Able to follow commands without difficulty. Cranial Nerves: II-Visual fields were normal. III/IV/VI-left cornea was opacified; right pupil was slightly enlarged and nonreactive to light. Extraocular movements were full and conjugate.    V/VII-no facial numbness and no facial weakness. VIII-moderately hard of hearing X-normal speech and symmetrical palatal movement. Motor: 5/5 bilaterally with normal tone and bulk Sensory: Reduced laboratory sensation distally in both lower extremities. Deep Tendon Reflexes: Trace to 1+ and symmetric. Plantars: Mute bilaterally Cerebellar: Normal finger-to-nose testing. Carotid auscultation: Normal   Assessment: 79 y.o. 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  Stroke Risk Factors - diabetes mellitus, family history, hyperlipidemia and hypertension  Plan: 1. HgbA1c, fasting lipid panel 2. MRI, MRA  of the brain without contrast 3. PT consult, OT  4. Echocardiogram 5. Carotid dopplers 6. Prophylactic therapy-None 7. Risk factor modification 8. Telemetry monitoring   C.R. Roseanne Reno, MD [facility] [phone]  02/24/2013, 1:47 PM

## 2013-02-25 ENCOUNTER — Inpatient Hospital Stay (HOSPITAL_COMMUNITY): Payer: Medicare Other

## 2013-02-25 LAB — HEMOGLOBIN A1C: Mean Plasma Glucose: 143 mg/dL — ABNORMAL HIGH (ref <117)

## 2013-02-25 LAB — LIPID PANEL: Cholesterol: 147 mg/dL (ref 0–200)

## 2013-02-25 LAB — GLUCOSE, CAPILLARY
Glucose-Capillary: 222 mg/dL — ABNORMAL HIGH (ref 70–99)
Glucose-Capillary: 241 mg/dL — ABNORMAL HIGH (ref 70–99)

## 2013-02-25 MED ORDER — HALOPERIDOL LACTATE 5 MG/ML IJ SOLN
2.0000 mg | Freq: Once | INTRAMUSCULAR | Status: AC
  Administered 2013-02-25: 2 mg via INTRAVENOUS
  Filled 2013-02-25: qty 0.4

## 2013-02-25 NOTE — Progress Notes (Signed)
Stroke Team Progress Note  HISTORY Kent Mayo is an 79 y.o. male history of hypertension, hyperlipidemia and diabetes mellitus who experienced acute onset of severe dizziness on the afternoon of 02/22/2013. Patient subsequently had nausea and vomiting after arriving in the emergency room. No focal deficits were noted regarding his extremities. He had no changes in speech. CT scan of his head on 02/23/13 showed only mild atrophy and MRI could not be done due tometal in left eye. Follow up Ct 02/24/13 showed   small area of increased density involving the left pons indicative of possible acute hemorrhage versus artefact. His NIH stroke score was 0. He's been taking aspirin 81 mg per day. He indicates he had a small stroke he thinks maybe 25 years ago.  LSN: 02/22/2013, afternoon.  tPA Given: No: Beyond time under for treatment consideration   He has improved since admission and is no longer dizzy and wants to go home. He is legally blind and lives alone with help from his brother..  SUBJECTIVE No family is at the bedside.  Overall he feels his condition is completely resolved. He has no complaints today  OBJECTIVE Most recent Vital Signs: Filed Vitals:   02/24/13 1400 02/24/13 2210 02/25/13 0505 02/25/13 0917  BP: 128/69 171/69 183/66 164/67  Pulse: 68 71 70 84  Temp: 97.9 F (36.6 C) 97.9 F (36.6 C) 97.4 F (36.3 C)   TempSrc: Oral Oral Oral   Resp: 18 18 18    Height:      Weight:      SpO2: 98% 100% 93% 98%   CBG (last 3)   Recent Labs  02/24/13 1632 02/24/13 2136 02/25/13 0618  GLUCAP 179* 157* 161*    IV Fluid Intake:     MEDICATIONS  . aliskiren  300 mg Oral Daily   And  . hydrochlorothiazide  25 mg Oral Daily  . atorvastatin  20 mg Oral q1800  . insulin aspart  0-9 Units Subcutaneous TID WC  . linagliptin  5 mg Oral Daily  . nebivolol  5 mg Oral Daily  . sodium chloride  3 mL Intravenous Q12H  . sodium chloride  3 mL Intravenous Q12H  . verapamil  240 mg  Oral QHS   PRN:  acetaminophen, acetaminophen, ondansetron (ZOFRAN) IV, ondansetron  Diet:  Carb Control   Activity:    Bathroom privileges  DVT Prophylaxis:  SCDs CLINICALLY SIGNIFICANT STUDIES Basic Metabolic Panel:  Recent Labs Lab 02/22/13 1713 02/23/13 0138  NA 138  --   K 3.8  --   CL 105  --   CO2 20  --   GLUCOSE 184*  --   BUN 20  --   CREATININE 1.05 0.88  CALCIUM 9.7  --    Liver Function Tests:  Recent Labs Lab 02/22/13 2040  AST 24  ALT 16  ALKPHOS 66  BILITOT 0.5  PROT 7.6  ALBUMIN 3.8   CBC:  Recent Labs Lab 02/22/13 1713 02/23/13 0138  WBC 12.6* 11.1*  HGB 14.4 14.7  HCT 42.0 42.2  MCV 89.2 89.2  PLT 240 243   Coagulation: No results found for this basename: LABPROT, INR,  in the last 168 hours Cardiac Enzymes:  Recent Labs Lab 02/23/13 0138 02/23/13 0827 02/23/13 1350  TROPONINI <0.30 <0.30 <0.30   Urinalysis:  Recent Labs Lab 02/22/13 2025  COLORURINE YELLOW  LABSPEC 1.017  PHURINE 8.0  GLUCOSEU NEGATIVE  HGBUR NEGATIVE  BILIRUBINUR NEGATIVE  KETONESUR NEGATIVE  PROTEINUR NEGATIVE  UROBILINOGEN  1.0  NITRITE NEGATIVE  LEUKOCYTESUR NEGATIVE   Lipid Panel    Component Value Date/Time   CHOL 147 02/25/2013 0530   TRIG 78 02/25/2013 0530   HDL 48 02/25/2013 0530   CHOLHDL 3.1 02/25/2013 0530   VLDL 16 02/25/2013 0530   LDLCALC 83 02/25/2013 0530   HgbA1C  No results found for this basename: HGBA1C    Urine Drug Screen:   No results found for this basename: labopia, cocainscrnur, labbenz, amphetmu, thcu, labbarb    Alcohol Level: No results found for this basename: ETH,  in the last 168 hours  Dg Eye Foreign Body  02/23/2013   *RADIOLOGY REPORT*  Clinical Data: Question foreign body in the region orbits.  ORBITS FOR FOREIGN BODY - 2 VIEW  Comparison: None.  Findings: Prior right orbital surgery with prostatic structure in place.  Radiopaque structure left orbit.  Etiology indeterminate.  The patient is not safe for MR  scanning until these objects have been cleared by surgical notes detailing what was placed and then clearing placed object as MR safe.  IMPRESSION: The patient is NOT cleared for MR imaging.  Please see above.   Original Report Authenticated By: [name], M.D.   Ct Head Wo Contrast  02/24/2013   *RADIOLOGY REPORT*  Clinical Data: Dizziness when lying down.  CT HEAD WITHOUT CONTRAST  Technique:  Contiguous axial images were obtained from the base of the skull through the vertex without contrast.  Comparison: 02/22/2013 CT.  Findings: Radiopaque structures in the region of the orbits consistent with prior surgical implant on the right and possible metallic structure on the left.  Mild exophthalmos.  Question artifact versus tiny 5mm hemorrhage superior left paracentral pons (series 2 image 11).  No other findings of intracranial hemorrhage or acute thrombotic infarct.  Global atrophy and small vessel disease type changes.  Vascular calcifications.  IMPRESSION: Question artifact versus tiny 5mm hemorrhage superior left paracentral pons (series 2 image 11).  This has been made a PRA call report utilizing dashboard call feature.   Original Report Authenticated By: [name], M.D.       MRI of the brain  Cannot do due left eye metallic foreign body    2D Echocardiogram 02/23/13 Normal EF. No clot  Carotid Doppler  ordered LE Venous dopplers  02/23/13 negative for DVT CXR    EKG Sinus rhythm with 1st degree A-V block Left anterior fascicular block Left ventricular hypertrophy  Therapy Recommendations  SNF  Physical Exam   Awake alert. Afebrile. Head is nontraumatic. Neck is supple without bruit. Hearing is normal. Cardiac exam no murmur or gallop. Lungs are clear to auscultation. Distal pulses are well felt.venous stasis bilateral LE Neurological Exam :  ;  Awake  Alert oriented x 3. Normal speech and language.eye movements full without nystagmus.fundi were not visualized. Legally blind. Left  eye corneal opacity and right eye cab barely detect movement.   Hearing is normal. Palatal movements are normal. Face symmetric. Tongue midline. Normal strength, tone, reflexes and coordination. Normal sensation. Gait deferred. ASSESSMENT Mr. Kent Mayo is a 79 y.o. male presenting with  Transient dizziness and gait ataxia. Initial Ct head showed no infarct or hemorrhage but repeat Ct shows small 5 mm left ventral Pontine hyperintensity-hemorrhage versus artefact. MRI cannot be done due to metallic foreign body in left eye. If this is true hemorrhage etiology cavernoma versus hypertensive microvascular disease.On aspirin 81 mg orally every day prior to admission. Now on no antiplatelet drugs for secondary  stroke prevention. Patient with no residual deficits  Work up underway.      Hospital day # 3  TREATMENT/PLAN Repeat Ct head to distinguish artefact versus hemorrhage as cannot do MRI. It is unusual for patient to present with dizziness on 7/24 and not show hemorrhage on admission Ct but for it to show up 24 hours later.Check carotid dopplers SNF placement per therapies Strict BP control with goal 120-180    Delia Heady, MD 02/25/2013 9:50 AM

## 2013-02-25 NOTE — Progress Notes (Signed)
Chair alarm alarming. Found patient standing and trying to climb in closet to use bathroom. Assisted patient to bathroom. Returned to chair with alarm to eat dinner. Patient returned to bed with assistance after eating. Bed alarm in place. Patient continues to talk with someone who is not in the room. Patient reoriented to place, time, and situation. Call bell near. Will continue to monitor.Mamie Levers

## 2013-02-25 NOTE — Progress Notes (Signed)
Patient is increasing confused as day goes on. Patient continues to climb out of bed; h/o dementia. Dr. Blake Divine notified; new orders received. Will monitor.Kent Mayo

## 2013-02-25 NOTE — Progress Notes (Signed)
[facility] PROGRESS NOTE  Kent Mayo UJW:[id number] DOB: 1927/04/15 DOA: 02/22/2013 PCP: No primary provider on file.  Assessment/Plan: Dizziness - orthostatic changes negative. MRI brain could not be done because of metal in the eye, repeat CT done and shows small hemorrahge in the pons. Neurology consulted for further recommendations. Neurology recommended full stroke work up and carotid duplex is till pending.   given the abnormal EKG, cardiac markers done and were negative.   2 d echo showed severe LVH.  Closely observe in telemetry. denies any chest paiN. Diabetes mellitus type 2 - continue present medications.  CBG (last 3)   Recent Labs  02/24/13 2136 02/25/13 0618 02/25/13 1202  GLUCAP 157* 161* 194*     Chronic lower extremity edema -  Dopplers to rule out DVT DONE and negative. .  Hypertension - continue present medications.  Hyperlipidemia - continue present medications.   Code Status: full code Family Communication: none at bedside Disposition Plan: SNF placement.    Consultants:  neurology  Procedures:  CT head. Marland Kitchen  Antibiotics:  none  HPI/Subjective: No dizzines. No chest pain.   Objective: Filed Vitals:   02/24/13 1400 02/24/13 2210 02/25/13 0505 02/25/13 0917  BP: 128/69 171/69 183/66 164/67  Pulse: 68 71 70 84  Temp: 97.9 F (36.6 C) 97.9 F (36.6 C) 97.4 F (36.3 C)   TempSrc: Oral Oral Oral   Resp: 18 18 18    Height:      Weight:      SpO2: 98% 100% 93% 98%    Intake/Output Summary (Last 24 hours) at 02/25/13 1633 Last data filed at 02/25/13 0921  Gross per 24 hour  Intake    480 ml  Output    830 ml  Net   -350 ml   Filed Weights   02/23/13 0055 02/23/13 0128  Weight: 121.2 kg (267 lb 3.2 oz) 121.2 kg (267 lb 3.2 oz)    Exam: gen alert afebrile comfortable CVS s1s2  Lungs clear Abdomen soft NT ND BS+ Extremities : no pedal edema.     Data Reviewed: Basic Metabolic Panel:  Recent Labs Lab  02/22/13 1713 02/23/13 0138  NA 138  --   K 3.8  --   CL 105  --   CO2 20  --   GLUCOSE 184*  --   BUN 20  --   CREATININE 1.05 0.88  CALCIUM 9.7  --    Liver Function Tests:  Recent Labs Lab 02/22/13 2040  AST 24  ALT 16  ALKPHOS 66  BILITOT 0.5  PROT 7.6  ALBUMIN 3.8   No results found for this basename: LIPASE, AMYLASE,  in the last 168 hours No results found for this basename: AMMONIA,  in the last 168 hours CBC:  Recent Labs Lab 02/22/13 1713 02/23/13 0138  WBC 12.6* 11.1*  HGB 14.4 14.7  HCT 42.0 42.2  MCV 89.2 89.2  PLT 240 243   Cardiac Enzymes:  Recent Labs Lab 02/23/13 0138 02/23/13 0827 02/23/13 1350  TROPONINI <0.30 <0.30 <0.30   BNP (last 3 results)  Recent Labs  02/23/13 0138  PROBNP 212.4   CBG:  Recent Labs Lab 02/24/13 1115 02/24/13 1632 02/24/13 2136 02/25/13 0618 02/25/13 1202  GLUCAP 131* 179* 157* 161* 194*    No results found for this or any previous visit (from the past 240 hour(s)).   Studies: Ct Head Wo Contrast  02/25/2013   *RADIOLOGY REPORT*  Clinical Data: Dizziness today.  Follow-up questioned pontine  abnormality.  CT HEAD WITHOUT CONTRAST  Technique:  Contiguous axial images were obtained from the base of the skull through the vertex without contrast.  Comparison: Head CT 02/24/2013 and 02/22/2013.  Findings: The pons appears normal, suggesting that the finding questioned on the most recent study was artifactual.  There is no evidence of acute intracranial hemorrhage, mass lesion, brain edema or extra-axial fluid collection.  Minimal periventricular white matter disease is unchanged.  Postsurgical changes in both orbits are again noted with probable previous scleral banding on the right.  There is a small metallic foreign body anteriorly in the left lobe.  The visualized paranasal sinuses are clear.  IMPRESSION: No acute intracranial findings.  The questioned pontine abnormality on the recent study appears  artifactual.   Original Report Authenticated By: Kent Mayo, M.D.   Ct Head Wo Contrast  02/24/2013   *RADIOLOGY REPORT*  Clinical Data: Dizziness when lying down.  CT HEAD WITHOUT CONTRAST  Technique:  Contiguous axial images were obtained from the base of the skull through the vertex without contrast.  Comparison: 02/22/2013 CT.  Findings: Radiopaque structures in the region of the orbits consistent with prior surgical implant on the right and possible metallic structure on the left.  Mild exophthalmos.  Question artifact versus tiny 5mm hemorrhage superior left paracentral pons (series 2 image 11).  No other findings of intracranial hemorrhage or acute thrombotic infarct.  Global atrophy and small vessel disease type changes.  Vascular calcifications.  IMPRESSION: Question artifact versus tiny 5mm hemorrhage superior left paracentral pons (series 2 image 11).  This has been made a PRA call report utilizing dashboard call feature.   Original Report Authenticated By: Kent Mayo, M.D.    Scheduled Meds: . aliskiren  300 mg Oral Daily   And  . hydrochlorothiazide  25 mg Oral Daily  . atorvastatin  20 mg Oral q1800  . insulin aspart  0-9 Units Subcutaneous TID WC  . linagliptin  5 mg Oral Daily  . nebivolol  5 mg Oral Daily  . sodium chloride  3 mL Intravenous Q12H  . sodium chloride  3 mL Intravenous Q12H  . verapamil  240 mg Oral QHS   Continuous Infusions:   Principal Problem:   Dizziness and giddiness Active Problems:   HTN (hypertension)   Diabetes mellitus   Hyperlipidemia   Pontine hemorrhage        Kent Mayo  [facility] Pager [phone]. If 7PM-7AM, please contact night-coverage at [email or web address], password [facility] 02/25/2013, 4:33 PM  LOS: 3 days

## 2013-02-25 NOTE — Progress Notes (Signed)
Patient sitting on side of bedside with bed alarm in place. Patient is hallucinating and talking to "the man in the corner." Patient reoriented to date, time, and situation. Will continue to monitor.Kent Mayo

## 2013-02-25 NOTE — Progress Notes (Signed)
VASCULAR LAB PRELIMINARY  PRELIMINARY  PRELIMINARY  PRELIMINARY  Carotid Dopplers completed.    Preliminary report:  There is 0-39% ICA stenosis.  Vertebral artery flow not insonated secondary to body habitus and constant movement and mumbling.  Tyrese Ficek, RVT 02/25/2013, 11:21 AM

## 2013-02-25 NOTE — Evaluation (Signed)
Occupational Therapy Evaluation Patient Details Name: Kent Mayo MRN: [id number] DOB: June 20, 1927 Today's Date: 02/25/2013 Time: 4098-1191 OT Time Calculation (min): 27 min  OT Assessment / Plan / Recommendation History of present illness 79 y.o. male admitted with dizziness, chronic LE edema. Orthostatics negative, CT head negative, Dopplers negative.   Clinical Impression   Pt is legally blind and is limited with tasks in a new environment due to the vision impairment. He was talking as if someone else was in the room when OT arrived but no one was in the room. Nursing aware of this per chart. He will benefit from skilled OT services to improve safety and strength with ADL for discharge to next venue.    OT Assessment  Patient needs continued OT Services    Follow Up Recommendations  SNF;Supervision/Assistance - 24 hour    Barriers to Discharge      Equipment Recommendations  3 in 1 bedside comode    Recommendations for Other Services    Frequency  Min 2X/week    Precautions / Restrictions Precautions Precautions: Fall Precaution Comments: very poor vision Restrictions Weight Bearing Restrictions: No   Pertinent Vitals/Pain No complaint of    ADL  Eating/Feeding: Minimal assistance (due to decreased vision) Where Assessed - Eating/Feeding: Bed level Grooming: Simulated;Wash/dry hands;Minimal assistance Where Assessed - Grooming: Unsupported sitting Upper Body Bathing: Simulated;Chest;Right arm;Left arm;Abdomen;Moderate assistance (mostly to locate items due to vision issues and pt cognition) Where Assessed - Upper Body Bathing: Unsupported sitting Lower Body Bathing: Simulated;Moderate assistance Where Assessed - Lower Body Bathing: Supported sit to stand Upper Body Dressing: Simulated;Minimal assistance Where Assessed - Upper Body Dressing: Unsupported sitting Lower Body Dressing: Simulated;Moderate assistance (without AE) Where Assessed - Lower Body Dressing:  Supported sit to Pharmacist, hospital: Simulated;Moderate assistance (difficult to direct pt to recliner even with cues) Toilet Transfer Method: Stand pivot Toileting - Clothing Manipulation and Hygiene: Simulated;Minimal assistance Where Assessed - Toileting Clothing Manipulation and Hygiene: Standing ADL Comments: Pt stood from bed with min guard assist before OT was ready for him to stand. He needed min assist to maintain balance in standing. Pt with much difficulty following verbal instructions for pivoting to the chair. Note vision issues but pt despite max cueing to turn around so he could back up to the chair would still try to step forward and would run into the chair. Pt talking as if someone was present in the room when OT arrived but no one else was present besides OT. Pt states he uses a sock aid at home for socks. He answered questions appropriately about his home setup and PLOF but then had much more difficulty with following directions for mobility direction change/turns etc so the assist was more for manually turning him than for balance/support. Nursing tech placed gauze on call button for tactile recognition.     OT Diagnosis: Generalized weakness;Cognitive deficits  OT Problem List: Decreased strength;Impaired balance (sitting and/or standing);Decreased knowledge of use of DME or AE;Decreased cognition OT Treatment Interventions: Self-care/ADL training;DME and/or AE instruction;Therapeutic activities;Patient/family education   OT Goals(Current goals can be found in the care plan section) Acute Rehab OT Goals Patient Stated Goal: none stated OT Goal Formulation: Patient unable to participate in goal setting Time For Goal Achievement: 03/11/13 Potential to Achieve Goals: Good  Visit Information  Last OT Received On: 02/25/13 Assistance Needed: +1 History of Present Illness: 79 y.o. male admitted with dizziness, chronic LE edema. Orthostatics negative, CT head negative, Dopplers  negative.  Prior Functioning     Home Living Family/patient expects to be discharged to:: Private residence Living Arrangements: Alone Type of Home: Apartment Home Access: Stairs to enter Entrance Stairs-Number of Steps: flight Home Equipment: Cane - single point;Shower seat Prior Function Level of Independence: Independent with assistive device(s) Communication Communication: HOH         Vision/Perception Vision - History Baseline Vision: Legally blind   Cognition  Cognition Arousal/Alertness: Awake/alert Behavior During Therapy: Restless Overall Cognitive Status: Impaired/Different from baseline Area of Impairment: Following commands Following Commands: Follows one step commands inconsistently General Comments: pt talking out loud when OT arrived--appeared to be having conversation with someone though no one present in room. Pt able to state that he is in [facility] and could describe his bathroom setup, PLOF. Note pt is blind but even when given tactile and verbal cues about which way to turn and sit in the chair, pt was trying to repeatedly step forward and run into the chair and wall. Required max tactile and verbal cues to turn and sit in the chair.    Extremity/Trunk Assessment Upper Extremity Assessment Upper Extremity Assessment: Overall WFL for tasks assessed     Mobility Bed Mobility Bed Mobility: Supine to Sit Supine to Sit: 4: Min guard;HOB flat Details for Bed Mobility Assistance: used momentum to sit up on side of bed. Transfers Transfers: Sit to Stand;Stand to Sit Sit to Stand: 4: Min guard;With upper extremity assist;From bed Stand to Sit: 4: Min assist;To chair/3-in-1 Details for Transfer Assistance: multi modal cues for hand placement to sit. pt stood from bed before OT ready.     Exercise     Balance Balance Balance Assessed: Yes Dynamic Standing Balance Dynamic Standing - Level of Assistance: 4: Min assist   End of Session  OT - End of Session Activity Tolerance: Patient tolerated treatment well Patient left: in chair;with call bell/phone within reach;with chair alarm set  GO     Lennox Laity [phone] 02/25/2013, 4:15 PM

## 2013-02-26 LAB — GLUCOSE, CAPILLARY

## 2013-02-26 NOTE — Progress Notes (Signed)
Stroke Team Progress Note  HISTORY Kent Mayo is an 79 y.o. male history of hypertension, hyperlipidemia and diabetes mellitus who experienced acute onset of severe dizziness on the afternoon of 02/22/2013. Patient subsequently had nausea and vomiting after arriving in the emergency room. No focal deficits were noted regarding his extremities. He had no changes in speech. CT scan of his head on 02/23/13 showed only mild atrophy and MRI could not be done due tometal in left eye. Follow up Ct 02/24/13 showed   small area of increased density involving the left pons indicative of possible acute hemorrhage versus artefact. His NIH stroke score was 0. He's been taking aspirin 81 mg per day. He indicates he had a small stroke he thinks maybe 25 years ago.  LSN: 02/22/2013, afternoon.  tPA Given: No: Beyond time under for treatment consideration   He has improved since admission and is no longer dizzy and wants to go home. He is legally blind and lives alone with help from his brother..  SUBJECTIVE No family is at the bedside. He has no complaints today. The patient is very hard of hearing. Dr. Pearlean Brownie discussed the results of the most recent CT scan with the patient. The patient's nurse reports that he was confused and agitated last night; although, he responded well to Haldol.  OBJECTIVE Most recent Vital Signs: Filed Vitals:   02/25/13 2052 02/25/13 2107 02/26/13 0333 02/26/13 1157  BP: 208/89 167/74 153/65 140/59  Pulse: 80  55 60  Temp: 98.4 F (36.9 C)  97.9 F (36.6 C)   TempSrc: Oral  Oral   Resp: 18  18   Height:      Weight:      SpO2: 99%  100%    CBG (last 3)   Recent Labs  02/25/13 1636 02/25/13 2115 02/26/13 0627  GLUCAP 222* 241* 159*    IV Fluid Intake:     MEDICATIONS  . aliskiren  300 mg Oral Daily   And  . hydrochlorothiazide  25 mg Oral Daily  . atorvastatin  20 mg Oral q1800  . insulin aspart  0-9 Units Subcutaneous TID WC  . linagliptin  5 mg Oral Daily   . nebivolol  5 mg Oral Daily  . sodium chloride  3 mL Intravenous Q12H  . sodium chloride  3 mL Intravenous Q12H  . verapamil  240 mg Oral QHS   PRN:  acetaminophen, acetaminophen, ondansetron (ZOFRAN) IV, ondansetron  Diet:  Carb Control   Activity:    Bathroom privileges  DVT Prophylaxis:  SCDs CLINICALLY SIGNIFICANT STUDIES Basic Metabolic Panel:   Recent Labs Lab 02/22/13 1713 02/23/13 0138  NA 138  --   K 3.8  --   CL 105  --   CO2 20  --   GLUCOSE 184*  --   BUN 20  --   CREATININE 1.05 0.88  CALCIUM 9.7  --    Liver Function Tests:   Recent Labs Lab 02/22/13 2040  AST 24  ALT 16  ALKPHOS 66  BILITOT 0.5  PROT 7.6  ALBUMIN 3.8   CBC:   Recent Labs Lab 02/22/13 1713 02/23/13 0138  WBC 12.6* 11.1*  HGB 14.4 14.7  HCT 42.0 42.2  MCV 89.2 89.2  PLT 240 243   Coagulation: No results found for this basename: LABPROT, INR,  in the last 168 hours Cardiac Enzymes:   Recent Labs Lab 02/23/13 0138 02/23/13 0827 02/23/13 1350  TROPONINI <0.30 <0.30 <0.30   Urinalysis:  Recent Labs Lab 02/22/13 2025  COLORURINE YELLOW  LABSPEC 1.017  PHURINE 8.0  GLUCOSEU NEGATIVE  HGBUR NEGATIVE  BILIRUBINUR NEGATIVE  KETONESUR NEGATIVE  PROTEINUR NEGATIVE  UROBILINOGEN 1.0  NITRITE NEGATIVE  LEUKOCYTESUR NEGATIVE   Lipid Panel    Component Value Date/Time   CHOL 147 02/25/2013 0530   TRIG 78 02/25/2013 0530   HDL 48 02/25/2013 0530   CHOLHDL 3.1 02/25/2013 0530   VLDL 16 02/25/2013 0530   LDLCALC 83 02/25/2013 0530   HgbA1C  Lab Results  Component Value Date   HGBA1C 6.6* 02/25/2013    Urine Drug Screen:   No results found for this basename: labopia,  cocainscrnur,  labbenz,  amphetmu,  thcu,  labbarb    Alcohol Level: No results found for this basename: ETH,  in the last 168 hours  Ct Head Wo Contrast 02/25/2013   No acute intracranial findings.  The questioned pontine abnormality on the recent study appears artifactual.   .    MRI of the  brain  Cannot do due left eye metallic foreign body    2D Echocardiogram 02/23/13 Normal EF. No clot  Carotid Doppler  Bilateral: mild soft plaque origin ICA. 0-39% ICA stenosis. Vertebral artery flow not insonated secondary to body habitus, movement, and mumbling.  LE Venous dopplers  02/23/13 negative for DVT  CXR    EKG Sinus rhythm with 1st degree A-V block Left anterior fascicular block Left ventricular hypertrophy  Therapy Recommendations  SNF  Physical Exam   Awake alert. Afebrile. Head is nontraumatic. Neck is supple without bruit. Hearing is normal. Cardiac exam no murmur or gallop. Lungs are clear to auscultation. Distal pulses are well felt.venous stasis bilateral LE Neurological Exam :  ;  Awake  Alert oriented x 3. Normal speech and language.eye movements full without nystagmus.fundi were not visualized. Legally blind. Left eye corneal opacity and right eye cab barely detect movement.   Hearing is normal. Palatal movements are normal. Face symmetric. Tongue midline. Normal strength, tone, reflexes and coordination. Normal sensation. Gait deferred. ASSESSMENT Mr. Kent Mayo is a 79 y.o. male presenting with  Transient dizziness and gait ataxia. Initial Ct head showed no infarct or hemorrhage but repeat Ct shows small 5 mm left ventral Pontine hyperintensity-hemorrhage versus artefact. MRI cannot be done due to metallic foreign body in left eye. If this is true hemorrhage etiology cavernoma versus hypertensive microvascular disease.On aspirin 81 mg orally every day prior to admission. Now on no antiplatelet drugs for secondary stroke prevention. Patient with no residual deficits  Work up underway.   Diabetes mellitus - hemoglobin A1c 6.6  Hypertension  Hyperlipidemia - on Lipitor prior to admission and currently - cholesterol 147 LDL 83   Hospital day # 4  TREATMENT/PLAN Repeat Ct head shows artifact and not hemorrhage. Carotid Dopplers revealed no high-grade  stenosis. SNF placement or assisted inpatient rehabilitation Strict BP control with goal 120-180  The stroke team was signed off at this time. Please contact the [facility] team if assistance is required for treatment of confusion and agitation.    Delton See PA-C [facility] Pager [phone] 02/26/2013, 12:21 PM

## 2013-02-26 NOTE — Progress Notes (Signed)
[facility] PROGRESS NOTE  ALEKSA Mayo AVW:[id number] DOB: 12-01-1926 DOA: 02/22/2013 PCP: No primary provider on file.  Assessment/Plan: Dizziness - orthostatic changes negative. MRI brain could not be done because of metal in the eye, repeat CT done and shows small hemorrahge in the pons. Neurology consulted for further recommendations. Neurology recommended full stroke work up and carotid duplex does not show significant stenosis. Repeat CT done yesterday shows artifact.   given the abnormal EKG, cardiac markers done and were negative.   2 d echo showed severe LVH.  Closely observe in telemetry. denies any chest paiN. Diabetes mellitus type 2 - continue present medications.  CBG (last 3)   Recent Labs  02/25/13 1636 02/25/13 2115 02/26/13 0627  GLUCAP 222* 241* 159*     Chronic lower extremity edema -  Dopplers to rule out DVT DONE and negative. .  Hypertension - continue present medications.  Hyperlipidemia - continue present medications.   Code Status: full code Family Communication: none at bedside Disposition Plan: SNF placement.    Consultants:  neurology  Procedures:  CT head. Marland Kitchen  Antibiotics:  none  HPI/Subjective: No dizzines. No chest pain.  Very confused last night, getting out of bed, and bumping in to doors and wall. Ordered a small dose of haldol, after which he was more calmer. Denies any headache, nausea or chest pain.   Objective: Filed Vitals:   02/25/13 0917 02/25/13 2052 02/25/13 2107 02/26/13 0333  BP: 164/67 208/89 167/74 153/65  Pulse: 84 80  55  Temp:  98.4 F (36.9 C)  97.9 F (36.6 C)  TempSrc:  Oral  Oral  Resp:  18  18  Height:      Weight:      SpO2: 98% 99%  100%    Intake/Output Summary (Last 24 hours) at 02/26/13 1127 Last data filed at 02/26/13 0139  Gross per 24 hour  Intake      0 ml  Output    425 ml  Net   -425 ml   Filed Weights   02/23/13 0055 02/23/13 0128  Weight: 121.2 kg (267 lb 3.2 oz) 121.2  kg (267 lb 3.2 oz)    Exam: gen alert afebrile comfortable CVS s1s2  Lungs clear Abdomen soft NT ND BS+ Extremities : no pedal edema.     Data Reviewed: Basic Metabolic Panel:  Recent Labs Lab 02/22/13 1713 02/23/13 0138  NA 138  --   K 3.8  --   CL 105  --   CO2 20  --   GLUCOSE 184*  --   BUN 20  --   CREATININE 1.05 0.88  CALCIUM 9.7  --    Liver Function Tests:  Recent Labs Lab 02/22/13 2040  AST 24  ALT 16  ALKPHOS 66  BILITOT 0.5  PROT 7.6  ALBUMIN 3.8   No results found for this basename: LIPASE, AMYLASE,  in the last 168 hours No results found for this basename: AMMONIA,  in the last 168 hours CBC:  Recent Labs Lab 02/22/13 1713 02/23/13 0138  WBC 12.6* 11.1*  HGB 14.4 14.7  HCT 42.0 42.2  MCV 89.2 89.2  PLT 240 243   Cardiac Enzymes:  Recent Labs Lab 02/23/13 0138 02/23/13 0827 02/23/13 1350  TROPONINI <0.30 <0.30 <0.30   BNP (last 3 results)  Recent Labs  02/23/13 0138  PROBNP 212.4   CBG:  Recent Labs Lab 02/25/13 0618 02/25/13 1202 02/25/13 1636 02/25/13 2115 02/26/13 0627  GLUCAP 161* 194*  222* 241* 159*    No results found for this or any previous visit (from the past 240 hour(s)).   Studies: Ct Head Wo Contrast  02/25/2013   *RADIOLOGY REPORT*  Clinical Data: Dizziness today.  Follow-up questioned pontine abnormality.  CT HEAD WITHOUT CONTRAST  Technique:  Contiguous axial images were obtained from the base of the skull through the vertex without contrast.  Comparison: Head CT 02/24/2013 and 02/22/2013.  Findings: The pons appears normal, suggesting that the finding questioned on the most recent study was artifactual.  There is no evidence of acute intracranial hemorrhage, mass lesion, brain edema or extra-axial fluid collection.  Minimal periventricular white matter disease is unchanged.  Postsurgical changes in both orbits are again noted with probable previous scleral banding on the right.  There is a small  metallic foreign body anteriorly in the left lobe.  The visualized paranasal sinuses are clear.  IMPRESSION: No acute intracranial findings.  The questioned pontine abnormality on the recent study appears artifactual.   Original Report Authenticated By: [name], M.D.    Scheduled Meds: . aliskiren  300 mg Oral Daily   And  . hydrochlorothiazide  25 mg Oral Daily  . atorvastatin  20 mg Oral q1800  . insulin aspart  0-9 Units Subcutaneous TID WC  . linagliptin  5 mg Oral Daily  . nebivolol  5 mg Oral Daily  . sodium chloride  3 mL Intravenous Q12H  . sodium chloride  3 mL Intravenous Q12H  . verapamil  240 mg Oral QHS   Continuous Infusions:   Principal Problem:   Dizziness and giddiness Active Problems:   HTN (hypertension)   Diabetes mellitus   Hyperlipidemia   Pontine hemorrhage        Suhail Peloquin  [facility] Pager [phone]. If 7PM-7AM, please contact night-coverage at [email or web address], password [facility] 02/26/2013, 11:27 AM  LOS: 4 days

## 2013-02-27 LAB — GLUCOSE, CAPILLARY
Glucose-Capillary: 168 mg/dL — ABNORMAL HIGH (ref 70–99)
Glucose-Capillary: 196 mg/dL — ABNORMAL HIGH (ref 70–99)

## 2013-02-27 MED ORDER — INSULIN ASPART 100 UNIT/ML ~~LOC~~ SOLN: 0.0000 [IU] | Freq: Three times a day (TID) | SUBCUTANEOUS | Status: DC

## 2013-02-27 NOTE — Progress Notes (Signed)
Clinical Social Work Department CLINICAL SOCIAL WORK PLACEMENT NOTE 02/27/2013  Patient:  Kent Mayo, Kent Mayo  Account Number:  [id number] Admit date:  02/22/2013  Clinical Social Worker:  Carren Rang  Date/time:  02/27/2013 12:11 PM  Clinical Social Work is seeking post-discharge placement for this patient at the following level of care:   SKILLED NURSING   (*CSW will update this form in Epic as items are completed)   02/27/2013  Patient/family provided with [facility] Department of Clinical Social Work's list of facilities offering this level of care within the geographic area requested by the patient (or if unable, by the patient's family).  02/27/2013  Patient/family informed of their freedom to choose among providers that offer the needed level of care, that participate in Medicare, Medicaid or managed care program needed by the patient, have an available bed and are willing to accept the patient.  02/27/2013  Patient/family informed of [facility]' ownership interest in [facility], as well as of the fact that they are under no obligation to receive care at this facility.  PASARR submitted to EDS on 02/27/2013 PASARR number received from E[employer] on 02/27/2013  FL2 transmitted to all facilities in geographic area requested by pt/family on  02/27/2013 FL2 transmitted to all facilities within larger geographic area on   Patient informed that his/her managed care company has contracts with or will negotiate with  certain facilities, including the following:     Patient/family informed of bed offers received:   Patient chooses bed at  Physician recommends and patient chooses bed at    Patient to be transferred to  on   Patient to be transferred to facility by   The following physician request were entered in Epic:   Additional Comments:  Maree Krabbe, MSW, [employer] [phone]

## 2013-02-27 NOTE — Discharge Summary (Signed)
Physician Discharge Summary  Kent Mayo ZOX:[id number] DOB: July 09, 1927 DOA: 02/22/2013  PCP: No primary provider on file.  Admit date: 02/22/2013 Discharge date: 02/27/2013  Time spent: 25 minutes  Recommendations for Outpatient Follow-up:  1. Follow up with neurology as outpatient as needed 2. Follow up with PCP in one week 3. Follow up with cardiology in 1 to 2 weeks for possible outpatient stress test.   Discharge Diagnoses:  Principal Problem:   Dizziness and giddiness Active Problems:   HTN (hypertension)   Diabetes mellitus   Hyperlipidemia   Pontine hemorrhage   Discharge Condition: improved.   Diet recommendation: low sodium diet  Filed Weights   02/23/13 0055 02/23/13 0128  Weight: 121.2 kg (267 lb 3.2 oz) 121.2 kg (267 lb 3.2 oz)    History of present illness:  BAYDEN GIL is a 79 y.o. male history of diabetes mellitus type 2, hypertension and hyperlipidemia started experiencing dizziness from yesterday morning. Patient states that he has been feeling dizzy on lying and standing. Denies any associated hearing loss or tinnitus or any loss of consciousness or palpitations. Denies any nausea vomiting abdominal pain chest pain or shortness of breath. In the ER patient was found to be not orthostatic. CT head was negative for anything acute. EKG shows sinus rhythm with ST-T changes which were concerning but upsloping and patient does not have any chest pain and troponins were negative. Patient has been admitted for further management. Patient also has chronic lower extremity edema.    Hospital Course:   Dizziness - orthostatic changes negative. MRI brain could not be done because of metal in the eye, repeat CT done and shows small hemorrahge in the pons. Neurology consulted for further recommendations. Neurology recommended full stroke work up and carotid duplex does not show significant stenosis. Repeat CT done yesterday shows artifact.  given the abnormal EKG,  cardiac markers done and were negative. 2 d echo showed severe LVH. Closely observe in telemetry. denies any chest paiN.  Diabetes mellitus type 2 - continue present medications.  CBG (last 3)   Recent Labs   02/25/13 1636  02/25/13 2115  02/26/13 0627   GLUCAP  222*  241*  159*    Chronic lower extremity edema - Dopplers to rule out DVT DONE and negative. .  Hypertension - continue present medications.  Hyperlipidemia - continue present medications.  Procedures: CT head  Echocardiogram Carotid duplex Consultations:  Neurology.  Discharge Exam: Filed Vitals:   02/26/13 1157 02/26/13 1351 02/26/13 2108 02/27/13 0509  BP: 140/59 142/75 145/69 143/74  Pulse: 60 55 59 52  Temp:  98.1 F (36.7 C) 98.4 F (36.9 C) 98.2 F (36.8 C)  TempSrc:  Oral Oral Oral  Resp:   20 18  Height:      Weight:      SpO2:  97% 97% 96%    gen alert afebrile comfortable  CVS s1s2  Lungs clear  Abdomen soft NT ND BS+  Extremities : no pedal edema.   Discharge Instructions     Medication List    ASK your doctor about these medications       dorzolamide-timolol 22.3-6.8 MG/ML ophthalmic solution  Commonly known as:  COSOPT  Place 1 drop into the right eye 2 (two) times daily.     linagliptin 5 MG Tabs tablet  Commonly known as:  TRADJENTA  Take 5 mg by mouth daily.     nebivolol 5 MG tablet  Commonly known as:  BYSTOLIC  Take 5 mg by mouth daily.     rosuvastatin 10 MG tablet  Commonly known as:  CRESTOR  Take 10 mg by mouth daily.     TEKTURNA HCT 300-25 MG Tabs  Generic drug:  Aliskiren-Hydrochlorothiazide  Take 1 tablet by mouth daily.     verapamil 240 MG CR tablet  Commonly known as:  CALAN-SR  Take 240 mg by mouth at bedtime.       No Known Allergies    The results of significant diagnostics from this hospitalization (including imaging, microbiology, ancillary and laboratory) are listed below for reference.    Significant Diagnostic Studies: Dg Eye  Foreign Body  02/23/2013   *RADIOLOGY REPORT*  Clinical Data: Question foreign body in the region orbits.  ORBITS FOR FOREIGN BODY - 2 VIEW  Comparison: None.  Findings: Prior right orbital surgery with prostatic structure in place.  Radiopaque structure left orbit.  Etiology indeterminate.  The patient is not safe for MR scanning until these objects have been cleared by surgical notes detailing what was placed and then clearing placed object as MR safe.  IMPRESSION: The patient is NOT cleared for MR imaging.  Please see above.   Original Report Authenticated By: [name], M.D.   Dg Chest 2 View  02/22/2013   *RADIOLOGY REPORT*  Clinical Data: Shortness of breath.  CHEST - 2 VIEW  Comparison: Report of two-view chest x-ray 02/22/2003.  Findings: The heart is mildly enlarged.  The appearance is exaggerated by low lung volumes.  No focal airspace disease is present.  The visualized soft tissues and bony thorax are unremarkable.  IMPRESSION:  1.  Borderline cardiomegaly without failure. 2.  No acute cardiopulmonary disease.   Original Report Authenticated By: Marin Roberts, M.D.   Ct Head Wo Contrast  02/25/2013   *RADIOLOGY REPORT*  Clinical Data: Dizziness today.  Follow-up questioned pontine abnormality.  CT HEAD WITHOUT CONTRAST  Technique:  Contiguous axial images were obtained from the base of the skull through the vertex without contrast.  Comparison: Head CT 02/24/2013 and 02/22/2013.  Findings: The pons appears normal, suggesting that the finding questioned on the most recent study was artifactual.  There is no evidence of acute intracranial hemorrhage, mass lesion, brain edema or extra-axial fluid collection.  Minimal periventricular white matter disease is unchanged.  Postsurgical changes in both orbits are again noted with probable previous scleral banding on the right.  There is a small metallic foreign body anteriorly in the left lobe.  The visualized paranasal sinuses are clear.   IMPRESSION: No acute intracranial findings.  The questioned pontine abnormality on the recent study appears artifactual.   Original Report Authenticated By: [name], M.D.   Ct Head Wo Contrast  02/24/2013   *RADIOLOGY REPORT*  Clinical Data: Dizziness when lying down.  CT HEAD WITHOUT CONTRAST  Technique:  Contiguous axial images were obtained from the base of the skull through the vertex without contrast.  Comparison: 02/22/2013 CT.  Findings: Radiopaque structures in the region of the orbits consistent with prior surgical implant on the right and possible metallic structure on the left.  Mild exophthalmos.  Question artifact versus tiny 5mm hemorrhage superior left paracentral pons (series 2 image 11).  No other findings of intracranial hemorrhage or acute thrombotic infarct.  Global atrophy and small vessel disease type changes.  Vascular calcifications.  IMPRESSION: Question artifact versus tiny 5mm hemorrhage superior left paracentral pons (series 2 image 11).  This has been made a PRA call report utilizing dashboard  call feature.   Original Report Authenticated By: [name], M.D.   Ct Head Wo Contrast  02/23/2013   *RADIOLOGY REPORT*  Clinical Data: Severe headache.  The patient woke up feeling dizzy 1 hour ago.  CT HEAD WITHOUT CONTRAST  Technique:  Contiguous axial images were obtained from the base of the skull through the vertex without contrast.  Comparison: Report of MRI brain without and with contrast at [facility] 06/09/2002.  Findings: Moderate generalized atrophy and white matter disease is evident.  No acute cortical infarct, hemorrhage, or mass lesion is present.  The ventricles are proportionate to the degree of atrophy.  No significant extra-axial fluid collection is present.  The paranasal sinuses and mastoid air cells are clear.  The osseous skull is intact.  Scleral banding is present on the right.  A focal density along the left globe is small metallic fragment.   IMPRESSION:  1.  Age advanced atrophy and white matter disease likely reflects the sequelae of chronic microvascular ischemia. 2.  No acute intracranial abnormality.   Original Report Authenticated By: Marin Roberts, M.D.    Microbiology: No results found for this or any previous visit (from the past 240 hour(s)).   Labs: Basic Metabolic Panel:  Recent Labs Lab 02/22/13 1713 02/23/13 0138  NA 138  --   K 3.8  --   CL 105  --   CO2 20  --   GLUCOSE 184*  --   BUN 20  --   CREATININE 1.05 0.88  CALCIUM 9.7  --    Liver Function Tests:  Recent Labs Lab 02/22/13 2040  AST 24  ALT 16  ALKPHOS 66  BILITOT 0.5  PROT 7.6  ALBUMIN 3.8   No results found for this basename: LIPASE, AMYLASE,  in the last 168 hours No results found for this basename: AMMONIA,  in the last 168 hours CBC:  Recent Labs Lab 02/22/13 1713 02/23/13 0138  WBC 12.6* 11.1*  HGB 14.4 14.7  HCT 42.0 42.2  MCV 89.2 89.2  PLT 240 243   Cardiac Enzymes:  Recent Labs Lab 02/23/13 0138 02/23/13 0827 02/23/13 1350  TROPONINI <0.30 <0.30 <0.30   BNP: BNP (last 3 results)  Recent Labs  02/23/13 0138  PROBNP 212.4   CBG:  Recent Labs Lab 02/26/13 0627 02/26/13 1124 02/26/13 1612 02/26/13 2159 02/27/13 0635  GLUCAP 159* 169* 174* 162* 177*       Signed:  Ersel Enslin  [facility] 02/27/2013, 9:28 AM

## 2013-02-27 NOTE — Progress Notes (Addendum)
Clinical Social Worker provided brother with skilled nursing facility bed offers. Patient appears open to SNF option. Brother chose [facility]. CSW confirmed with [facility] that they have a bed tomorrow. Plan is d/c tomorrow morning. Will update further when new information arises.  Maree Krabbe, MSW, [facility] [phone]

## 2013-02-27 NOTE — Progress Notes (Signed)
Physical Therapy Treatment Patient Details Name: Kent Mayo MRN: [id number] DOB: 10-Aug-1926 Today's Date: 02/27/2013 Time: 5621-3086 PT Time Calculation (min): 20 min  PT Assessment / Plan / Recommendation  History of Present Illness 79 y.o. male admitted with dizziness, chronic LE edema. Orthostatics negative, CT head negative, Dopplers negative.   Clinical Impression Pt demonstrates improvements in functional mobility at this time. Able to ambulate better with SPC. Patient states that he feels much better. Rec supervision for OOB if patient is returning home. Will continue to see as indicated.      Follow Up Recommendations  SNF;Supervision for mobility/OOB (If dc home, will need supervision for OOB initially)           Equipment Recommendations  Rolling walker with 5" wheels    Recommendations for Other Services OT consult  Frequency Min 3X/week   Progress towards PT Goals Progress towards PT goals: Progressing toward goals  Plan Current plan remains appropriate    Precautions / Restrictions Precautions Precautions: Fall Precaution Comments: very poor vision Restrictions Weight Bearing Restrictions: No   Pertinent Vitals/Pain Pt reports no pain    Mobility  Bed Mobility Bed Mobility: Not assessed Transfers Transfers: Sit to Stand;Stand to Sit Sit to Stand: 4: Min guard;With upper extremity assist;From bed Stand to Sit: 4: Min guard Details for Transfer Assistance: Multi modal cues for hand placement secondary to poor vision Ambulation/Gait Ambulation/Gait Assistance: 4: Min guard;4: Min Environmental consultant (Feet): 320 Feet Assistive device: Rolling walker;Straight cane (160 with each device, pt better with [facility]) Ambulation/Gait Assistance Details: Verbal and visual cues secondary to blindness Gait Pattern: Step-through pattern Gait velocity: decreased General Gait Details: some instability noted       PT Goals (current goals can now be found in the  care plan section) Acute Rehab PT Goals Patient Stated Goal: none stated PT Goal Formulation: With patient Time For Goal Achievement: 03/09/13 Potential to Achieve Goals: Good  Visit Information  Last PT Received On: 02/27/13 Assistance Needed: +1 History of Present Illness: 79 y.o. male admitted with dizziness, chronic LE edema. Orthostatics negative, CT head negative, Dopplers negative.    Subjective Data  Subjective: I feel like I am about 85% not quite 100% yet Patient Stated Goal: none stated   Cognition  Cognition Arousal/Alertness: Awake/alert Behavior During Therapy: WFL for tasks assessed/performed Overall Cognitive Status: Impaired/Different from baseline Area of Impairment: Following commands Following Commands: Follows one step commands inconsistently    Balance  Balance Balance Assessed: Yes Static Sitting Balance Static Sitting - Balance Support: Feet supported;Bilateral upper extremity supported Static Sitting - Level of Assistance: 7: Independent Dynamic Standing Balance Dynamic Standing - Level of Assistance: 5: Stand by assistance  End of Session PT - End of Session Equipment Utilized During Treatment: Gait belt Activity Tolerance: Patient tolerated treatment well Patient left: in chair;with chair alarm set;with call bell/phone within reach Nurse Communication: Mobility status   GP     Fabio Asa 02/27/2013, 10:50 AM Charlotte Crumb, PT DPT  [phone]

## 2013-02-27 NOTE — Progress Notes (Signed)
Clinical Social Work Department BRIEF PSYCHOSOCIAL ASSESSMENT 02/27/2013  Patient:  ERMINE, SPOFFORD     Account Number:  [id number]     Admit date:  02/22/2013  Clinical Social Worker:  Carren Rang  Date/Time:  02/27/2013 12:07 PM  Referred by:  Care Management  Date Referred:  02/27/2013 Referred for  SNF Placement   Other Referral:   Interview type:  Other - See comment Other interview type:   brother julius at bedside    PSYCHOSOCIAL DATA Living Status:  ALONE Admitted from facility:   Level of care:   Primary support name:  Lisbeth Ply [phone] Primary support relationship to patient:  SIBLING Degree of support available:    CURRENT CONCERNS Current Concerns  Post-Acute Placement   Other Concerns:    SOCIAL WORK ASSESSMENT / PLAN Clinical Social Worker received referral for SNF placement at d/c. CSW introduced self and explained reason for visit. Patient had visitor by bedside, brother Lisbeth Ply. CSW explained SNF process and provided SNF packet to patient and family. Patient reported he is agreeable for SNF placement. CSW will complete FL2 for MD's signature and will update patient and family when bed offers are received.   Assessment/plan status:  Psychosocial Support/Ongoing Assessment of Needs Other assessment/ plan:   Information/referral to community resources:   SNF packet    PATIENT'S/FAMILY'S RESPONSE TO PLAN OF CARE: Patient and brother, Lisbeth Ply were agreeable to SNF placement. They agreed to be faxed out in [facility]. Brother states to contact him for further questions.       Maree Krabbe, MSW, Theresia Majors [phone]

## 2013-02-28 LAB — GLUCOSE, CAPILLARY

## 2013-02-28 NOTE — Discharge Summary (Signed)
Physician Discharge Summary  Kent Mayo ZOX:[id number] DOB: August 05, 1926 DOA: 02/22/2013  PCP: No primary provider on file.  Admit date: 02/22/2013 Discharge date: 02/28/2013  Time spent: 25 minutes  Recommendations for Outpatient Follow-up:  1. Follow up with neurology as outpatient as needed 2. Follow up with PCP in one week 3. Follow up with cardiology in 1 to 2 weeks for possible outpatient stress test.   Discharge Diagnoses:  Principal Problem:   Dizziness and giddiness Active Problems:   HTN (hypertension)   Diabetes mellitus   Hyperlipidemia   Pontine hemorrhage   Discharge Condition: improved.   Diet recommendation: low sodium diet  Filed Weights   02/23/13 0055 02/23/13 0128  Weight: 121.2 kg (267 lb 3.2 oz) 121.2 kg (267 lb 3.2 oz)    History of present illness:  Kent Mayo is a 79 y.o. male history of diabetes mellitus type 2, hypertension and hyperlipidemia started experiencing dizziness from yesterday morning. Patient states that he has been feeling dizzy on lying and standing. Denies any associated hearing loss or tinnitus or any loss of consciousness or palpitations. Denies any nausea vomiting abdominal pain chest pain or shortness of breath. In the ER patient was found to be not orthostatic. CT head was negative for anything acute. EKG shows sinus rhythm with ST-T changes which were concerning but upsloping and patient does not have any chest pain and troponins were negative. Patient has been admitted for further management. Patient also has chronic lower extremity edema.    Hospital Course:   Dizziness - orthostatic changes negative. MRI brain could not be done because of metal in the eye, repeat CT done and shows small hemorrahge in the pons. Neurology consulted for further recommendations. Neurology recommended full stroke work up and carotid duplex does not show significant stenosis. Repeat CT done yesterday shows artifact.  given the abnormal EKG,  cardiac markers done and were negative. 2 d echo showed severe LVH. Closely observeD in telemetry. denies any chest paiN or sob.  Diabetes mellitus type 2 - continue present medications.  CBG (last 3)   Recent Labs  02/27/13 1612 02/27/13 2049 02/28/13 0604  GLUCAP 168* 196* 148*    His cbg's are better and less than 200.    Chronic lower extremity edema - Dopplers to rule out DVT DONE and negative. .  Hypertension - continue present medications.  Hyperlipidemia - continue present medications.  Procedures: CT head  Echocardiogram Carotid duplex Consultations:  Neurology.  Discharge Exam: Filed Vitals:   02/27/13 1346 02/27/13 2208 02/28/13 0451 02/28/13 1004  BP: 142/71 145/62 164/65 123/58  Pulse: 57 73 54   Temp: 98 F (36.7 C) 97.3 F (36.3 C) 97.7 F (36.5 C)   TempSrc: Oral Oral Oral   Resp: 18 18 18    Height:      Weight:      SpO2: 96% 97% 97%     gen alert afebrile comfortable  CVS s1s2  Lungs clear  Abdomen soft NT ND BS+  Extremities : no pedal edema.   Discharge Instructions     Medication List    STOP taking these medications       nebivolol 5 MG tablet  Commonly known as:  BYSTOLIC      TAKE these medications       dorzolamide-timolol 22.3-6.8 MG/ML ophthalmic solution  Commonly known as:  COSOPT  Place 1 drop into the right eye 2 (two) times daily.     insulin aspart 100 UNIT/ML injection  Commonly known as:  novoLOG  Inject 0-9 Units into the skin 3 (three) times daily with meals.     linagliptin 5 MG Tabs tablet  Commonly known as:  TRADJENTA  Take 5 mg by mouth daily.     rosuvastatin 10 MG tablet  Commonly known as:  CRESTOR  Take 10 mg by mouth daily.     TEKTURNA HCT 300-25 MG Tabs  Generic drug:  Aliskiren-Hydrochlorothiazide  Take 1 tablet by mouth daily.     verapamil 240 MG CR tablet  Commonly known as:  CALAN-SR  Take 240 mg by mouth at bedtime.       No Known Allergies Follow-up Information   Follow up  with [facility]. Schedule an appointment as soon as possible for a visit in 2 weeks.   Contact information:   [address] [place] [address] [phone]       The results of significant diagnostics from this hospitalization (including imaging, microbiology, ancillary and laboratory) are listed below for reference.    Significant Diagnostic Studies: Dg Eye Foreign Body  02/23/2013   *RADIOLOGY REPORT*  Clinical Data: Question foreign body in the region orbits.  ORBITS FOR FOREIGN BODY - 2 VIEW  Comparison: None.  Findings: Prior right orbital surgery with prostatic structure in place.  Radiopaque structure left orbit.  Etiology indeterminate.  The patient is not safe for MR scanning until these objects have been cleared by surgical notes detailing what was placed and then clearing placed object as MR safe.  IMPRESSION: The patient is NOT cleared for MR imaging.  Please see above.   Original Report Authenticated By: [name], M.D.   Dg Chest 2 View  02/22/2013   *RADIOLOGY REPORT*  Clinical Data: Shortness of breath.  CHEST - 2 VIEW  Comparison: Report of two-view chest x-ray 02/22/2003.  Findings: The heart is mildly enlarged.  The appearance is exaggerated by low lung volumes.  No focal airspace disease is present.  The visualized soft tissues and bony thorax are unremarkable.  IMPRESSION:  1.  Borderline cardiomegaly without failure. 2.  No acute cardiopulmonary disease.   Original Report Authenticated By: Marin Roberts, M.D.   Ct Head Wo Contrast  02/25/2013   *RADIOLOGY REPORT*  Clinical Data: Dizziness today.  Follow-up questioned pontine abnormality.  CT HEAD WITHOUT CONTRAST  Technique:  Contiguous axial images were obtained from the base of the skull through the vertex without contrast.  Comparison: Head CT 02/24/2013 and 02/22/2013.  Findings: The pons appears normal, suggesting that the finding questioned on the most recent study was  artifactual.  There is no evidence of acute intracranial hemorrhage, mass lesion, brain edema or extra-axial fluid collection.  Minimal periventricular white matter disease is unchanged.  Postsurgical changes in both orbits are again noted with probable previous scleral banding on the right.  There is a small metallic foreign body anteriorly in the left lobe.  The visualized paranasal sinuses are clear.  IMPRESSION: No acute intracranial findings.  The questioned pontine abnormality on the recent study appears artifactual.   Original Report Authenticated By: [name], M.D.   Ct Head Wo Contrast  02/24/2013   *RADIOLOGY REPORT*  Clinical Data: Dizziness when lying down.  CT HEAD WITHOUT CONTRAST  Technique:  Contiguous axial images were obtained from the base of the skull through the vertex without contrast.  Comparison: 02/22/2013 CT.  Findings: Radiopaque structures in the region of the orbits consistent with prior surgical implant on the right  and possible metallic structure on the left.  Mild exophthalmos.  Question artifact versus tiny 5mm hemorrhage superior left paracentral pons (series 2 image 11).  No other findings of intracranial hemorrhage or acute thrombotic infarct.  Global atrophy and small vessel disease type changes.  Vascular calcifications.  IMPRESSION: Question artifact versus tiny 5mm hemorrhage superior left paracentral pons (series 2 image 11).  This has been made a PRA call report utilizing dashboard call feature.   Original Report Authenticated By: [name], M.D.   Ct Head Wo Contrast  02/23/2013   *RADIOLOGY REPORT*  Clinical Data: Severe headache.  The patient woke up feeling dizzy 1 hour ago.  CT HEAD WITHOUT CONTRAST  Technique:  Contiguous axial images were obtained from the base of the skull through the vertex without contrast.  Comparison: Report of MRI brain without and with contrast at [facility] 06/09/2002.  Findings: Moderate generalized atrophy and white  matter disease is evident.  No acute cortical infarct, hemorrhage, or mass lesion is present.  The ventricles are proportionate to the degree of atrophy.  No significant extra-axial fluid collection is present.  The paranasal sinuses and mastoid air cells are clear.  The osseous skull is intact.  Scleral banding is present on the right.  A focal density along the left globe is small metallic fragment.  IMPRESSION:  1.  Age advanced atrophy and white matter disease likely reflects the sequelae of chronic microvascular ischemia. 2.  No acute intracranial abnormality.   Original Report Authenticated By: Marin Roberts, M.D.    Microbiology: No results found for this or any previous visit (from the past 240 hour(s)).   Labs: Basic Metabolic Panel:  Recent Labs Lab 02/22/13 1713 02/23/13 0138  NA 138  --   K 3.8  --   CL 105  --   CO2 20  --   GLUCOSE 184*  --   BUN 20  --   CREATININE 1.05 0.88  CALCIUM 9.7  --    Liver Function Tests:  Recent Labs Lab 02/22/13 2040  AST 24  ALT 16  ALKPHOS 66  BILITOT 0.5  PROT 7.6  ALBUMIN 3.8   No results found for this basename: LIPASE, AMYLASE,  in the last 168 hours No results found for this basename: AMMONIA,  in the last 168 hours CBC:  Recent Labs Lab 02/22/13 1713 02/23/13 0138  WBC 12.6* 11.1*  HGB 14.4 14.7  HCT 42.0 42.2  MCV 89.2 89.2  PLT 240 243   Cardiac Enzymes:  Recent Labs Lab 02/23/13 0138 02/23/13 0827 02/23/13 1350  TROPONINI <0.30 <0.30 <0.30   BNP: BNP (last 3 results)  Recent Labs  02/23/13 0138  PROBNP 212.4   CBG:  Recent Labs Lab 02/27/13 0635 02/27/13 1127 02/27/13 1612 02/27/13 2049 02/28/13 0604  GLUCAP 177* 141* 168* 196* 148*       Signed:  Carrye Goller  [facility] 02/28/2013, 11:09 AM Addendum:  No new changes in the last 24 hours. He is looking forward to being discharged.    Kathlen Mody, MD.

## 2013-02-28 NOTE — Progress Notes (Signed)
Clinical Social Worker facilitated patient discharge by contacting the patient, family and facility, [facility]. Patient agreeable to this plan and arranging transport via EMS.. CSW will sign off, as social work intervention is no longer needed.  Slayton Lubitz, MSW, LCSWA [phone]  

## 2013-02-28 NOTE — Progress Notes (Signed)
Assessment unchanged. IV and tele removed. Pt's belongings returned and placed with pt on stretcher. Packet given to EMS. Pt left via EMS to [facility]. Report called to [facility].

## 2013-03-24 ENCOUNTER — Ambulatory Visit (INDEPENDENT_AMBULATORY_CARE_PROVIDER_SITE_OTHER): Payer: Medicare Other | Admitting: Neurology

## 2013-03-24 ENCOUNTER — Encounter: Payer: Self-pay | Admitting: Neurology

## 2013-03-24 VITALS — BP 135/67 | HR 54 | Ht 69.5 in | Wt 264.0 lb

## 2013-03-24 DIAGNOSIS — R42 Dizziness and giddiness: Secondary | ICD-10-CM

## 2013-03-24 NOTE — Progress Notes (Signed)
Reason for visit: Dizziness  Kent Mayo is a 79 y.o. male  History of present illness:  Kent Mayo is an 79 year old black male with a history of hypertension, diabetes, and dyslipidemia. The patient was admitted to the hospital on 02/22/2013 with onset of dizziness. The patient had taken a nap, and when he woke up, he was noted to have true vertigo. The patient felt somewhat queasy, and later on he had vomiting. The patient denied any headache, and he denied any focal numbness, or weakness of the arms, legs, or face. The patient denied slurred speech, confusion, or difficulty swallowing. The patient was unable to walk because of the severe dizziness. The patient was brought to the hospital, and evaluation for possible stroke was done. The patient had a questionable lesion in the pons that was later felt to be an artifact. The CT scan of the brain otherwise was unremarkable. The patient underwent a carotid Doppler study that was unremarkable, and a 2-D echocardiogram showed an ejection fraction of 55-60%, with left ventricular wall thickening consistent with LVH, and mild left atrial enlargement was noted. No source of cardiac emboli were seen. The patient is residing in an extended care facility. The patient feels that he is back to baseline. The patient uses a cane for ambulation on a chronic basis. The patient has not had any further falls. The patient has not had any further episodes of dizziness.  Past Medical History  Diagnosis Date  . Diabetes mellitus without complication   . Hypertension   . Hearing difficulty of both ears     Bilateral hearing aids  . Blindness     Severe visual impairment OD, Blind OS  . Abnormality of gait   . Peripheral edema   . Glaucoma   . Dyslipidemia     Past Surgical History  Procedure Laterality Date  . Eye surgery      Family History  Problem Relation Age of Onset  . Hypertension Mother   . Hypertension Father   . Diabetes Mellitus  II Brother     Social history:  reports that he has quit smoking. He does not have any smokeless tobacco history on file. He reports that he does not drink alcohol or use illicit drugs.  Medications:  Current Outpatient Prescriptions on File Prior to Visit  Medication Sig Dispense Refill  . Aliskiren-Hydrochlorothiazide (TEKTURNA HCT) 300-25 MG TABS Take 1 tablet by mouth daily.      . dorzolamide-timolol (COSOPT) 22.3-6.8 MG/ML ophthalmic solution Place 1 drop into the right eye 2 (two) times daily.      . insulin aspart (NOVOLOG) 100 UNIT/ML injection Inject 0-9 Units into the skin 3 (three) times daily with meals.  1 vial  12  . linagliptin (TRADJENTA) 5 MG TABS tablet Take 5 mg by mouth daily.      . rosuvastatin (CRESTOR) 10 MG tablet Take 10 mg by mouth daily.      . verapamil (CALAN-SR) 240 MG CR tablet Take 240 mg by mouth at bedtime.       No current facility-administered medications on file prior to visit.     No Known Allergies  ROS:  Out of a complete 14 system review of symptoms, the patient complains only of the following symptoms, and all other reviewed systems are negative.  Swelling in the legs Hearing loss Moles Loss of vision Allergies, runny nose Memory loss, confusion Decreased energy, and disinterest in activities, hallucinations Snoring  Blood pressure 135/67, pulse 54,  height 5' 9.5" (1.765 m), weight 264 lb (119.75 kg).  Physical Exam  General: The patient is alert and cooperative at the time of the examination. The patient is moderately obese.  Head: Pupils are equal, round, and reactive to light on the right, corneal opacity on the left. Disc is flat on the right.  Neck: The neck is supple, no carotid bruits are noted.  Respiratory: The respiratory examination is clear.  Cardiovascular: The cardiovascular examination reveals a regular rate and rhythm, no obvious murmurs or rubs are noted.  Skin: Extremities are with 3+ woody edema below the  knees.  Neurologic Exam  Mental status:  Cranial nerves: Facial symmetry is present. There is good sensation of the face to pinprick and soft touch bilaterally. The strength of the facial muscles and the muscles to head turning and shoulder shrug are normal bilaterally. Speech is well enunciated, no aphasia or dysarthria is noted. Extraocular movements are full. Visual fields are restricted with the right eye. The patient is blind in the left eye.  Motor: The motor testing reveals 5 over 5 strength of all 4 extremities. Good symmetric motor tone is noted throughout.  Sensory: Sensory testing is intact to pinprick, soft touch, vibration sensation, and position sense on all 4 extremities, with the exception that there may be a stocking pattern pinprick sensory deficit two thirds the way up the legs below the knees. No evidence of extinction is noted.  Coordination: Cerebellar testing reveals good finger-nose-finger and heel-to-shin bilaterally.  Gait and station: Gait is wide-based, the patient walks with a cane. Tandem gait was not attempted. Romberg is negative. No drift is seen.  Reflexes: Deep tendon reflexes are symmetric, but are depressed bilaterally. Toes are downgoing bilaterally.   Assessment/Plan:  1. Vertigo, questionable small vessel infarct  2. Diabetes  3. Gait disorder  The patient is at his usual baseline. The patient had been taken off of aspirin initially because one of the CT scan evaluation questions a small pontine hemorrhage. This appeared to be artifact on a repeat study. The patient is to go back on aspirin at this time. No further stroke workup is indicated. It is possible that the vertigo represented a vestibulitis rather than a stroke. The symptoms lasted less than 24 hours. The patient followup through this office if needed.  Marlan Palau MD 03/25/2013 8:33 AM  [facility] [address] [place], [place] [address]  Phone  [phone] Fax [phone]

## 2013-04-25 ENCOUNTER — Encounter: Payer: Self-pay | Admitting: Cardiovascular Disease

## 2013-04-26 ENCOUNTER — Ambulatory Visit (INDEPENDENT_AMBULATORY_CARE_PROVIDER_SITE_OTHER): Payer: Medicare Other | Admitting: Cardiovascular Disease

## 2013-04-26 ENCOUNTER — Encounter: Payer: Self-pay | Admitting: Cardiovascular Disease

## 2013-04-26 VITALS — BP 162/70 | HR 67 | Ht 72.0 in | Wt 262.0 lb

## 2013-04-26 DIAGNOSIS — E785 Hyperlipidemia, unspecified: Secondary | ICD-10-CM

## 2013-04-26 DIAGNOSIS — E119 Type 2 diabetes mellitus without complications: Secondary | ICD-10-CM

## 2013-04-26 DIAGNOSIS — I639 Cerebral infarction, unspecified: Secondary | ICD-10-CM | POA: Insufficient documentation

## 2013-04-26 DIAGNOSIS — I6350 Cerebral infarction due to unspecified occlusion or stenosis of unspecified cerebral artery: Secondary | ICD-10-CM

## 2013-04-26 DIAGNOSIS — R42 Dizziness and giddiness: Secondary | ICD-10-CM

## 2013-04-26 DIAGNOSIS — I10 Essential (primary) hypertension: Secondary | ICD-10-CM

## 2013-04-26 NOTE — Patient Instructions (Addendum)
Your physician recommends that you continue on your current medications as directed. Please refer to the Current Medication list given to you today.  Your physician recommends that you follow-up as needed.  

## 2013-04-26 NOTE — Assessment & Plan Note (Signed)
Cholesterol is at goal.  Continue current dose of statin and diet Rx.  No myalgias or side effects.  F/U  LFT's in 6 months. Lab Results  Component Value Date   LDLCALC 83 02/25/2013

## 2013-04-26 NOTE — Assessment & Plan Note (Signed)
Well controlled.  Continue current medications and low sodium Dash type diet.    

## 2013-04-26 NOTE — Progress Notes (Signed)
Patient ID: Kent Mayo, male   DOB: 09/17/1926, 79 y.o.   MRN: [id number] Kent Mayo is a 79 y.o. male history of diabetes mellitus type 2, hypertension and hyperlipidemia  D/C from hospital at end of July for dizzyness. Felt  dizzy on lying and standing. Denies any associated hearing loss or tinnitus or any loss of consciousness or palpitations. Denies any nausea vomiting abdominal pain chest pain or shortness of breath. In the ER patient was found to be not orthostatic. CT head was negative for anything acute. EKG shows sinus rhythm with ST-T changes which were concerning but upsloping and patient does not have any chest pain and troponins were negative.   Patient was admitted for further management. Patient also has chronic lower extremity edema.   orthostatic changes negative. MRI brain could not be done because of metal in the eye, repeat CT done and shows small hemorrahge in the pons. Neurology consulted for further recommendations. Neurology recommended full stroke work up and carotid duplex does not show significant stenosis. Repeat CT showed artifact.  given the abnormal EKG, cardiac markers done and were negative. 2 d echo showed severe LVH.   Since d/c no issues dizzyness improved. No syncope.  Compliant with meds    Echo 7/24   Study Conclusions  - Left ventricle: The cavity size was normal. Wall thickness was increased in a pattern of severe LVH. Systolic function was normal. The estimated ejection fraction was in the range of 55% to 60%. - Left atrium: The atrium was mildly dilated. - Atrial septum: No defect or patent foramen ovale was identified. Transthoracic echocardiography  ROS: Denies fever, malais, weight loss, blurry vision, decreased visual acuity, cough, sputum, SOB, hemoptysis, pleuritic pain, palpitaitons, heartburn, abdominal pain, melena, lower extremity edema, claudication, or rash.  All other systems reviewed and negative   General: Affect  appropriate Elderly black male  HEENT: normal Neck supple with no adenopathy JVP normal no bruits no thyromegaly Lungs clear with no wheezing and good diaphragmatic motion Heart:  S1/S2 no murmur,rub, gallop or click PMI normal Abdomen: benighn, BS positve, no tenderness, no AAA no bruit.  No HSM or HJR Distal pulses intact with no bruits Plus 2 edema Neuro non-focal Skin warm and dry No muscular weakness  Medications Current Outpatient Prescriptions  Medication Sig Dispense Refill  . Aliskiren-Hydrochlorothiazide (TEKTURNA HCT) 300-25 MG TABS Take 1 tablet by mouth daily.      Marland Kitchen aspirin 81 MG tablet Take 81 mg by mouth daily.      . dorzolamide-timolol (COSOPT) 22.3-6.8 MG/ML ophthalmic solution Place 1 drop into the right eye 2 (two) times daily.      Marland Kitchen guaiFENesin (MUCINEX) 600 MG 12 hr tablet Take 1,200 mg by mouth 2 (two) times daily.      . insulin aspart (NOVOLOG) 100 UNIT/ML injection Inject 0-9 Units into the skin 3 (three) times daily with meals.  1 vial  12  . linagliptin (TRADJENTA) 5 MG TABS tablet Take 5 mg by mouth daily.      . nebivolol (BYSTOLIC) 5 MG tablet Take 5 mg by mouth daily.      . rosuvastatin (CRESTOR) 10 MG tablet Take 10 mg by mouth daily.      . verapamil (CALAN-SR) 240 MG CR tablet Take 240 mg by mouth at bedtime.       No current facility-administered medications for this visit.    Allergies Review of patient's allergies indicates no known allergies.  Family History: Family History  Problem Relation Age of Onset  . Hypertension Mother   . Hypertension Father   . Diabetes Mellitus II Brother     Social History: History   Social History  . Marital Status: Single    Spouse Name: N/A    Number of Children: N/A  . Years of Education: N/A   Occupational History  . Not on file.   Social History Main Topics  . Smoking status: Former Games developer  . Smokeless tobacco: Not on file  . Alcohol Use: No  . Drug Use: No  . Sexual Activity: Not  on file   Other Topics Concern  . Not on file   Social History Narrative  . No narrative on file    Electrocardiogram:  SR rate 61 PR 222  LAD LVH     Assessment and Plan

## 2013-04-26 NOTE — Assessment & Plan Note (Signed)
Discussed low carb diet.  Target hemoglobin A1c is 6.5 or less.  Continue current medications.  

## 2013-04-26 NOTE — Assessment & Plan Note (Signed)
Related to deep matter CVA  Vertebral flow normal EF normal Normal telemetry No need for further cardiac w/u

## 2013-04-26 NOTE — Assessment & Plan Note (Signed)
Non cortical No cardiac source F/U neurology Continue anti platelet Rx

## 2014-10-19 ENCOUNTER — Encounter (HOSPITAL_BASED_OUTPATIENT_CLINIC_OR_DEPARTMENT_OTHER): Payer: Medicare Other | Attending: Internal Medicine

## 2014-10-19 DIAGNOSIS — I872 Venous insufficiency (chronic) (peripheral): Secondary | ICD-10-CM | POA: Insufficient documentation

## 2014-10-19 DIAGNOSIS — Z794 Long term (current) use of insulin: Secondary | ICD-10-CM | POA: Insufficient documentation

## 2014-10-19 DIAGNOSIS — F0390 Unspecified dementia without behavioral disturbance: Secondary | ICD-10-CM | POA: Diagnosis not present

## 2014-10-19 DIAGNOSIS — E119 Type 2 diabetes mellitus without complications: Secondary | ICD-10-CM | POA: Diagnosis not present

## 2014-10-19 DIAGNOSIS — I10 Essential (primary) hypertension: Secondary | ICD-10-CM | POA: Diagnosis not present

## 2014-10-19 DIAGNOSIS — I890 Lymphedema, not elsewhere classified: Secondary | ICD-10-CM | POA: Insufficient documentation

## 2014-10-19 LAB — GLUCOSE, CAPILLARY: Glucose-Capillary: 121 mg/dL — ABNORMAL HIGH (ref 70–99)

## 2014-10-25 ENCOUNTER — Emergency Department (HOSPITAL_COMMUNITY)
Admission: EM | Admit: 2014-10-25 | Discharge: 2014-10-25 | Disposition: A | Discharge status: Home / Self Care | Payer: Medicare Other | Attending: Emergency Medicine | Admitting: Emergency Medicine

## 2014-10-25 ENCOUNTER — Encounter (HOSPITAL_COMMUNITY): Payer: Self-pay | Admitting: *Deleted

## 2014-10-25 DIAGNOSIS — R531 Weakness: Secondary | ICD-10-CM | POA: Insufficient documentation

## 2014-10-25 DIAGNOSIS — Z794 Long term (current) use of insulin: Secondary | ICD-10-CM | POA: Insufficient documentation

## 2014-10-25 DIAGNOSIS — Z7982 Long term (current) use of aspirin: Secondary | ICD-10-CM | POA: Diagnosis not present

## 2014-10-25 DIAGNOSIS — I10 Essential (primary) hypertension: Secondary | ICD-10-CM | POA: Diagnosis not present

## 2014-10-25 DIAGNOSIS — Z79899 Other long term (current) drug therapy: Secondary | ICD-10-CM | POA: Insufficient documentation

## 2014-10-25 DIAGNOSIS — E119 Type 2 diabetes mellitus without complications: Secondary | ICD-10-CM | POA: Diagnosis not present

## 2014-10-25 DIAGNOSIS — Z87891 Personal history of nicotine dependence: Secondary | ICD-10-CM | POA: Insufficient documentation

## 2014-10-25 DIAGNOSIS — H9193 Unspecified hearing loss, bilateral: Secondary | ICD-10-CM | POA: Diagnosis not present

## 2014-10-25 DIAGNOSIS — E785 Hyperlipidemia, unspecified: Secondary | ICD-10-CM | POA: Insufficient documentation

## 2014-10-25 DIAGNOSIS — R42 Dizziness and giddiness: Secondary | ICD-10-CM | POA: Diagnosis present

## 2014-10-25 DIAGNOSIS — H540 Blindness, both eyes: Secondary | ICD-10-CM | POA: Diagnosis not present

## 2014-10-25 DIAGNOSIS — R1110 Vomiting, unspecified: Secondary | ICD-10-CM | POA: Insufficient documentation

## 2014-10-25 DIAGNOSIS — H409 Unspecified glaucoma: Secondary | ICD-10-CM | POA: Diagnosis not present

## 2014-10-25 LAB — COMPREHENSIVE METABOLIC PANEL
ALBUMIN: 3.5 g/dL (ref 3.5–5.2)
ALT: 14 U/L (ref 0–53)
ANION GAP: 6 (ref 5–15)
AST: 27 U/L (ref 0–37)
Alkaline Phosphatase: 52 U/L (ref 39–117)
BUN: 21 mg/dL (ref 6–23)
CHLORIDE: 109 mmol/L (ref 96–112)
CO2: 26 mmol/L (ref 19–32)
CREATININE: 1.02 mg/dL (ref 0.50–1.35)
Calcium: 9.3 mg/dL (ref 8.4–10.5)
GFR calc non Af Amer: 64 mL/min — ABNORMAL LOW (ref >90)
GFR, EST AFRICAN AMERICAN: 75 mL/min — AB (ref >90)
Glucose, Bld: 158 mg/dL — ABNORMAL HIGH (ref 70–99)
Potassium: 3.9 mmol/L (ref 3.5–5.1)
SODIUM: 141 mmol/L (ref 135–145)
TOTAL PROTEIN: 7.1 g/dL (ref 6.0–8.3)
Total Bilirubin: 0.6 mg/dL (ref 0.3–1.2)

## 2014-10-25 LAB — CBC
HEMATOCRIT: 40 % (ref 39.0–52.0)
Hemoglobin: 13.4 g/dL (ref 13.0–17.0)
MCH: 30.6 pg (ref 26.0–34.0)
MCHC: 33.5 g/dL (ref 30.0–36.0)
MCV: 91.3 fL (ref 78.0–100.0)
PLATELETS: 220 10*3/uL (ref 150–400)
RBC: 4.38 MIL/uL (ref 4.22–5.81)
RDW: 13.7 % (ref 11.5–15.5)
WBC: 12.4 10*3/uL — AB (ref 4.0–10.5)

## 2014-10-25 LAB — URINALYSIS, ROUTINE W REFLEX MICROSCOPIC
Bilirubin Urine: NEGATIVE
Glucose, UA: NEGATIVE mg/dL
HGB URINE DIPSTICK: NEGATIVE
Ketones, ur: NEGATIVE mg/dL
Leukocytes, UA: NEGATIVE
NITRITE: NEGATIVE
Protein, ur: NEGATIVE mg/dL
SPECIFIC GRAVITY, URINE: 1.019 (ref 1.005–1.030)
UROBILINOGEN UA: 1 mg/dL (ref 0.0–1.0)
pH: 7 (ref 5.0–8.0)

## 2014-10-25 LAB — I-STAT TROPONIN, ED: Troponin i, poc: 0 ng/mL (ref 0.00–0.08)

## 2014-10-25 LAB — CBG MONITORING, ED: GLUCOSE-CAPILLARY: 124 mg/dL — AB (ref 70–99)

## 2014-10-25 NOTE — ED Provider Notes (Signed)
CSN: [id number]     Arrival date & time 10/25/14  1416 History   First MD Initiated Contact with Patient 10/25/14 1505     Chief Complaint  Patient presents with  . Dizziness  . Emesis     (Consider location/radiation/quality/duration/timing/severity/associated sxs/prior Treatment) HPI Comments: In here after having an acute onset of becoming dizzy while sitting down. Denied any associated chest pain, palpitations, shortness of breath. No recent illnesses. No recent medication changes. No recent blood in stool. Symptoms lasted for approximately 30 minutes and have not resolved. No focal neurological deficits noted by bystanders. No medications taken for this. He feels back to his baseline at this time. Dizziness was characterized as room spinning and nothing made it better or worse.  Patient is a 81 y.o. male presenting with dizziness and vomiting. The history is provided by the patient and a relative.  Dizziness Associated symptoms: vomiting   Emesis   Past Medical History  Diagnosis Date  . Diabetes mellitus without complication   . Hypertension   . Hearing difficulty of both ears     Bilateral hearing aids  . Blindness     Severe visual impairment OD, Blind OS  . Abnormality of gait   . Peripheral edema   . Glaucoma   . Dyslipidemia    Past Surgical History  Procedure Laterality Date  . Eye surgery     Family History  Problem Relation Age of Onset  . Hypertension Mother   . Hypertension Father   . Diabetes Mellitus II Brother    History  Substance Use Topics  . Smoking status: Former Research scientist (life sciences)  . Smokeless tobacco: Not on file  . Alcohol Use: No    Review of Systems  Gastrointestinal: Positive for vomiting.  Neurological: Positive for dizziness.  All other systems reviewed and are negative.     Allergies  Review of patient's allergies indicates no known allergies.  Home Medications   Prior to Admission medications   Medication Sig Start Date End Date  Taking? Authorizing Provider  Aliskiren-Hydrochlorothiazide (TEKTURNA HCT) 300-25 MG TABS Take 1 tablet by mouth daily.    Historical Provider, MD  aspirin 81 MG tablet Take 81 mg by mouth daily.    Historical Provider, MD  dorzolamide-timolol (COSOPT) 22.3-6.8 MG/ML ophthalmic solution Place 1 drop into the right eye 2 (two) times daily.    Historical Provider, MD  guaiFENesin (MUCINEX) 600 MG 12 hr tablet Take 1,200 mg by mouth 2 (two) times daily.    Historical Provider, MD  insulin aspart (NOVOLOG) 100 UNIT/ML injection Inject 0-9 Units into the skin 3 (three) times daily with meals. 02/27/13   Hosie Poisson, MD  linagliptin (TRADJENTA) 5 MG TABS tablet Take 5 mg by mouth daily.    Historical Provider, MD  nebivolol (BYSTOLIC) 5 MG tablet Take 5 mg by mouth daily.    Historical Provider, MD  rosuvastatin (CRESTOR) 10 MG tablet Take 10 mg by mouth daily.    Historical Provider, MD  verapamil (CALAN-SR) 240 MG CR tablet Take 240 mg by mouth at bedtime.    Historical Provider, MD   BP 173/76 mmHg  Pulse 68  Temp(Src) 97.6 F (36.4 C) (Oral)  Resp 14  Ht 6' (1.829 m)  Wt 262 lb (118.842 kg)  BMI 35.53 kg/m2  SpO2 100% Physical Exam  Constitutional: He is oriented to person, place, and time. He appears well-developed and well-nourished.  Non-toxic appearance. No distress.  HENT:  Head: Normocephalic and atraumatic.  Eyes: Conjunctivae,  EOM and lids are normal. Pupils are equal, round, and reactive to light.  Neck: Normal range of motion. Neck supple. No tracheal deviation present. No thyroid mass present.  Cardiovascular: Normal rate, regular rhythm and normal heart sounds.  Exam reveals no gallop.   No murmur heard. Pulmonary/Chest: Effort normal and breath sounds normal. No stridor. No respiratory distress. He has no decreased breath sounds. He has no wheezes. He has no rhonchi. He has no rales.  Abdominal: Soft. Normal appearance and bowel sounds are normal. He exhibits no distension.  There is no tenderness. There is no rebound and no CVA tenderness.  Musculoskeletal: Normal range of motion. He exhibits no edema or tenderness.  Neurological: He is alert and oriented to person, place, and time. He has normal strength. No cranial nerve deficit or sensory deficit. GCS eye subscore is 4. GCS verbal subscore is 5. GCS motor subscore is 6.  Skin: Skin is warm and dry. No abrasion and no rash noted.  Psychiatric: He has a normal mood and affect. His speech is normal and behavior is normal.  Nursing note and vitals reviewed.   ED Course  Procedures (including critical care time) Labs Review Labs Reviewed  CBG MONITORING, ED - Abnormal; Notable for the following:    Glucose-Capillary 124 (*)    All other components within normal limits  CBC  URINALYSIS, ROUTINE W REFLEX MICROSCOPIC  COMPREHENSIVE METABOLIC PANEL  I-STAT TROPOININ, ED    Imaging Review No results found.   EKG Interpretation   Date/Time:  Thursday October 25 2014 14:41:18 EDT Ventricular Rate:  75 PR Interval:  42 QRS Duration: 98 QT Interval:  388 QTC Calculation: 433 R Axis:   -31 Text Interpretation:  Sinus rhythm Short PR interval Abnormal R-wave  progression, early transition Left ventricular hypertrophy Anterior ST  elevation, probably due to LVH No significant change since last tracing  Confirmed by Narcissus Detwiler  MD, Kareena Arrambide ([id number]) on 10/25/2014 3:15:30 PM      MDM   Final diagnoses:  None    Patient not orthostatic here. Feels as baseline at this time. Labs reviewed and without acute findings. Stable for discharge    Lacretia Leigh, MD 10/25/14 1737

## 2014-10-25 NOTE — Discharge Instructions (Signed)
Fatigue °Fatigue is a feeling of tiredness, lack of energy, lack of motivation, or feeling tired all the time. Having enough rest, good nutrition, and reducing stress will normally reduce fatigue. Consult your caregiver if it persists. The nature of your fatigue will help your caregiver to find out its cause. The treatment is based on the cause.  °CAUSES  °There are many causes for fatigue. Most of the time, fatigue can be traced to one or more of your habits or routines. Most causes fit into one or more of three general areas. They are: °Lifestyle problems °· Sleep disturbances. °· Overwork. °· Physical exertion. °· Unhealthy habits. °· Poor eating habits or eating disorders. °· Alcohol and/or drug use . °· Lack of proper nutrition (malnutrition). °Psychological problems °· Stress and/or anxiety problems. °· Depression. °· Grief. °· Boredom. °Medical Problems or Conditions °· Anemia. °· Pregnancy. °· Thyroid gland problems. °· Recovery from major surgery. °· Continuous pain. °· Emphysema or asthma that is not well controlled °· Allergic conditions. °· Diabetes. °· Infections (such as mononucleosis). °· Obesity. °· Sleep disorders, such as sleep apnea. °· Heart failure or other heart-related problems. °· Cancer. °· Kidney disease. °· Liver disease. °· Effects of certain medicines such as antihistamines, cough and cold remedies, prescription pain medicines, heart and blood pressure medicines, drugs used for treatment of cancer, and some antidepressants. °SYMPTOMS  °The symptoms of fatigue include:  °· Lack of energy. °· Lack of drive (motivation). °· Drowsiness. °· Feeling of indifference to the surroundings. °DIAGNOSIS  °The details of how you feel help guide your caregiver in finding out what is causing the fatigue. You will be asked about your present and past health condition. It is important to review all medicines that you take, including prescription and non-prescription items. A thorough exam will be done.  You will be questioned about your feelings, habits, and normal lifestyle. Your caregiver may suggest blood tests, urine tests, or other tests to look for common medical causes of fatigue.  °TREATMENT  °Fatigue is treated by correcting the underlying cause. For example, if you have continuous pain or depression, treating these causes will improve how you feel. Similarly, adjusting the dose of certain medicines will help in reducing fatigue.  °HOME CARE INSTRUCTIONS  °· Try to get the required amount of good sleep every night. °· Eat a healthy and nutritious diet, and drink enough water throughout the day. °· Practice ways of relaxing (including yoga or meditation). °· Exercise regularly. °· Make plans to change situations that cause stress. Act on those plans so that stresses decrease over time. Keep your work and personal routine reasonable. °· Avoid street drugs and minimize use of alcohol. °· Start taking a daily multivitamin after consulting your caregiver. °SEEK MEDICAL CARE IF:  °· You have persistent tiredness, which cannot be accounted for. °· You have fever. °· You have unintentional weight loss. °· You have headaches. °· You have disturbed sleep throughout the night. °· You are feeling sad. °· You have constipation. °· You have dry skin. °· You have gained weight. °· You are taking any new or different medicines that you suspect are causing fatigue. °· You are unable to sleep at night. °· You develop any unusual swelling of your legs or other parts of your body. °SEEK IMMEDIATE MEDICAL CARE IF:  °· You are feeling confused. °· Your vision is blurred. °· You feel faint or pass out. °· You develop severe headache. °· You develop severe abdominal, pelvic, or   back pain. °· You develop chest pain, shortness of breath, or an irregular or fast heartbeat. °· You are unable to pass a normal amount of urine. °· You develop abnormal bleeding such as bleeding from the rectum or you vomit blood. °· You have thoughts  about harming yourself or committing suicide. °· You are worried that you might harm someone else. °MAKE SURE YOU:  °· Understand these instructions. °· Will watch your condition. °· Will get help right away if you are not doing well or get worse. °Document Released: 05/17/2007 Document Revised: 10/12/2011 Document Reviewed: 11/21/2013 °[employer]® Patient Information ©2015 [employer]. This information is not intended to replace advice given to you by your health care provider. Make sure you discuss any questions you have with your health care provider. °Weakness °Weakness is a lack of strength. It may be felt all over the body (generalized) or in one specific part of the body (focal). Some causes of weakness can be serious. You may need further medical evaluation, especially if you are elderly or you have a history of immunosuppression (such as chemotherapy or HIV), kidney disease, heart disease, or diabetes. °CAUSES  °Weakness can be caused by many different things, including: °· Infection. °· Physical exhaustion. °· Internal bleeding or other blood loss that results in a lack of red blood cells (anemia). °· Dehydration. This cause is more common in elderly people. °· Side effects or electrolyte abnormalities from medicines, such as pain medicines or sedatives. °· Emotional distress, anxiety, or depression. °· Circulation problems, especially severe peripheral arterial disease. °· Heart disease, such as rapid atrial fibrillation, bradycardia, or heart failure. °· Nervous system disorders, such as Guillain-Barré syndrome, multiple sclerosis, or stroke. °DIAGNOSIS  °To find the cause of your weakness, your caregiver will take your history and perform a physical exam. Lab tests or X-rays may also be ordered, if needed. °TREATMENT  °Treatment of weakness depends on the cause of your symptoms and can vary greatly. °HOME CARE INSTRUCTIONS  °· Rest as needed. °· Eat a well-balanced diet. °· Try to get some exercise  every day. °· Only take over-the-counter or prescription medicines as directed by your caregiver. °SEEK MEDICAL CARE IF:  °· Your weakness seems to be getting worse or spreads to other parts of your body. °· You develop new aches or pains. °SEEK IMMEDIATE MEDICAL CARE IF:  °· You cannot perform your normal daily activities, such as getting dressed and feeding yourself. °· You cannot walk up and down stairs, or you feel exhausted when you do so. °· You have shortness of breath or chest pain. °· You have difficulty moving parts of your body. °· You have weakness in only one area of the body or on only one side of the body. °· You have a fever. °· You have trouble speaking or swallowing. °· You cannot control your bladder or bowel movements. °· You have black or bloody vomit or stools. °MAKE SURE YOU: °· Understand these instructions. °· Will watch your condition. °· Will get help right away if you are not doing well or get worse. °Document Released: 07/20/2005 Document Revised: 01/19/2012 Document Reviewed: 09/18/2011 °[employer]® Patient Information ©2015 [employer]. This information is not intended to replace advice given to you by your health care provider. Make sure you discuss any questions you have with your health care provider. ° °

## 2014-10-25 NOTE — ED Notes (Signed)
CBG 124 

## 2014-10-25 NOTE — ED Notes (Addendum)
Pt was waiting in line and became acutely dizzy and began vomiting.  VS 147/95, hr 71 ns, cbg 123.  4 mg zofran and 328m asa given enroute.  Pt denies pain.  AO per his norm, per friend.  Hx cva 3 months prior with no residual.  Neuro intact.

## 2014-11-02 ENCOUNTER — Encounter (HOSPITAL_BASED_OUTPATIENT_CLINIC_OR_DEPARTMENT_OTHER): Payer: Medicare Other | Attending: Internal Medicine

## 2014-11-02 DIAGNOSIS — M7989 Other specified soft tissue disorders: Secondary | ICD-10-CM | POA: Insufficient documentation

## 2014-11-09 DIAGNOSIS — M7989 Other specified soft tissue disorders: Secondary | ICD-10-CM | POA: Diagnosis not present

## 2014-11-23 DIAGNOSIS — M7989 Other specified soft tissue disorders: Secondary | ICD-10-CM | POA: Diagnosis not present

## 2014-11-30 DIAGNOSIS — M7989 Other specified soft tissue disorders: Secondary | ICD-10-CM | POA: Diagnosis not present

## 2015-02-27 ENCOUNTER — Encounter: Payer: Self-pay | Admitting: *Deleted

## 2015-03-18 ENCOUNTER — Encounter (HOSPITAL_COMMUNITY): Payer: Self-pay | Admitting: *Deleted

## 2015-03-18 ENCOUNTER — Inpatient Hospital Stay (HOSPITAL_COMMUNITY)
Admission: EM | Admit: 2015-03-18 | Discharge: 2015-03-22 | DRG: 682 | Disposition: A | Discharge status: Skilled Nursing Facility | Payer: Medicare Other | Attending: Internal Medicine | Admitting: Internal Medicine

## 2015-03-18 DIAGNOSIS — R627 Adult failure to thrive: Secondary | ICD-10-CM | POA: Diagnosis present

## 2015-03-18 DIAGNOSIS — Z87891 Personal history of nicotine dependence: Secondary | ICD-10-CM | POA: Diagnosis not present

## 2015-03-18 DIAGNOSIS — H409 Unspecified glaucoma: Secondary | ICD-10-CM | POA: Diagnosis present

## 2015-03-18 DIAGNOSIS — Z8249 Family history of ischemic heart disease and other diseases of the circulatory system: Secondary | ICD-10-CM

## 2015-03-18 DIAGNOSIS — K760 Fatty (change of) liver, not elsewhere classified: Secondary | ICD-10-CM | POA: Diagnosis present

## 2015-03-18 DIAGNOSIS — Z8673 Personal history of transient ischemic attack (TIA), and cerebral infarction without residual deficits: Secondary | ICD-10-CM

## 2015-03-18 DIAGNOSIS — E785 Hyperlipidemia, unspecified: Secondary | ICD-10-CM | POA: Diagnosis present

## 2015-03-18 DIAGNOSIS — Z79899 Other long term (current) drug therapy: Secondary | ICD-10-CM | POA: Diagnosis not present

## 2015-03-18 DIAGNOSIS — N3289 Other specified disorders of bladder: Secondary | ICD-10-CM | POA: Diagnosis not present

## 2015-03-18 DIAGNOSIS — R339 Retention of urine, unspecified: Secondary | ICD-10-CM | POA: Diagnosis present

## 2015-03-18 DIAGNOSIS — E7220 Disorder of urea cycle metabolism, unspecified: Secondary | ICD-10-CM | POA: Diagnosis not present

## 2015-03-18 DIAGNOSIS — N179 Acute kidney failure, unspecified: Secondary | ICD-10-CM | POA: Diagnosis present

## 2015-03-18 DIAGNOSIS — R600 Localized edema: Secondary | ICD-10-CM | POA: Diagnosis present

## 2015-03-18 DIAGNOSIS — I10 Essential (primary) hypertension: Secondary | ICD-10-CM | POA: Diagnosis present

## 2015-03-18 DIAGNOSIS — K5900 Constipation, unspecified: Secondary | ICD-10-CM | POA: Diagnosis not present

## 2015-03-18 DIAGNOSIS — H9190 Unspecified hearing loss, unspecified ear: Secondary | ICD-10-CM | POA: Diagnosis present

## 2015-03-18 DIAGNOSIS — Z7982 Long term (current) use of aspirin: Secondary | ICD-10-CM

## 2015-03-18 DIAGNOSIS — R451 Restlessness and agitation: Secondary | ICD-10-CM | POA: Diagnosis not present

## 2015-03-18 DIAGNOSIS — Z794 Long term (current) use of insulin: Secondary | ICD-10-CM

## 2015-03-18 DIAGNOSIS — E119 Type 2 diabetes mellitus without complications: Secondary | ICD-10-CM | POA: Diagnosis present

## 2015-03-18 DIAGNOSIS — N401 Enlarged prostate with lower urinary tract symptoms: Secondary | ICD-10-CM | POA: Diagnosis present

## 2015-03-18 DIAGNOSIS — R338 Other retention of urine: Secondary | ICD-10-CM | POA: Diagnosis present

## 2015-03-18 DIAGNOSIS — H548 Legal blindness, as defined in USA: Secondary | ICD-10-CM | POA: Diagnosis present

## 2015-03-18 DIAGNOSIS — D72829 Elevated white blood cell count, unspecified: Secondary | ICD-10-CM | POA: Diagnosis not present

## 2015-03-18 DIAGNOSIS — G9340 Encephalopathy, unspecified: Secondary | ICD-10-CM | POA: Diagnosis present

## 2015-03-18 DIAGNOSIS — E860 Dehydration: Secondary | ICD-10-CM | POA: Diagnosis present

## 2015-03-18 DIAGNOSIS — Z833 Family history of diabetes mellitus: Secondary | ICD-10-CM | POA: Diagnosis not present

## 2015-03-18 DIAGNOSIS — R269 Unspecified abnormalities of gait and mobility: Secondary | ICD-10-CM | POA: Diagnosis present

## 2015-03-18 LAB — CBC
HCT: 40.1 % (ref 39.0–52.0)
Hemoglobin: 13.8 g/dL (ref 13.0–17.0)
MCH: 30.3 pg (ref 26.0–34.0)
MCHC: 34.4 g/dL (ref 30.0–36.0)
MCV: 88.1 fL (ref 78.0–100.0)
PLATELETS: 322 10*3/uL (ref 150–400)
RBC: 4.55 MIL/uL (ref 4.22–5.81)
RDW: 14 % (ref 11.5–15.5)
WBC: 8.4 10*3/uL (ref 4.0–10.5)

## 2015-03-18 LAB — BASIC METABOLIC PANEL
Anion gap: 10 (ref 5–15)
BUN: 23 mg/dL — AB (ref 6–20)
CO2: 20 mmol/L — ABNORMAL LOW (ref 22–32)
CREATININE: 1.77 mg/dL — AB (ref 0.61–1.24)
Calcium: 9.7 mg/dL (ref 8.9–10.3)
Chloride: 106 mmol/L (ref 101–111)
GFR calc Af Amer: 38 mL/min — ABNORMAL LOW (ref >60)
GFR, EST NON AFRICAN AMERICAN: 33 mL/min — AB (ref >60)
Glucose, Bld: 92 mg/dL (ref 65–99)
Potassium: 4.1 mmol/L (ref 3.5–5.1)
SODIUM: 136 mmol/L (ref 135–145)

## 2015-03-18 LAB — GLUCOSE, CAPILLARY: Glucose-Capillary: 90 mg/dL (ref 65–99)

## 2015-03-18 MED ORDER — VITAMIN B-1 100 MG PO TABS
100.0000 mg | ORAL_TABLET | Freq: Every day | ORAL | Status: DC
  Administered 2015-03-18 – 2015-03-22 (×5): 100 mg via ORAL
  Filled 2015-03-18 (×5): qty 1

## 2015-03-18 MED ORDER — ASPIRIN 81 MG PO CHEW
81.0000 mg | CHEWABLE_TABLET | Freq: Every day | ORAL | Status: DC
  Administered 2015-03-19 – 2015-03-22 (×4): 81 mg via ORAL
  Filled 2015-03-18 (×8): qty 1

## 2015-03-18 MED ORDER — SODIUM CHLORIDE 0.9 % IV BOLUS (SEPSIS)
1000.0000 mL | Freq: Once | INTRAVENOUS | Status: AC
  Administered 2015-03-18: 1000 mL via INTRAVENOUS

## 2015-03-18 MED ORDER — ONDANSETRON HCL 4 MG/2ML IJ SOLN: 4.0000 mg | Freq: Four times a day (QID) | INTRAMUSCULAR | Status: DC | PRN

## 2015-03-18 MED ORDER — LINAGLIPTIN 5 MG PO TABS
5.0000 mg | ORAL_TABLET | Freq: Every day | ORAL | Status: DC
  Administered 2015-03-19 – 2015-03-22 (×4): 5 mg via ORAL
  Filled 2015-03-18 (×4): qty 1

## 2015-03-18 MED ORDER — NEBIVOLOL HCL 10 MG PO TABS: 5.0000 mg | ORAL_TABLET | Freq: Every day | ORAL | Status: DC

## 2015-03-18 MED ORDER — LIDOCAINE HCL 2 % EX GEL
1.0000 "application " | Freq: Once | CUTANEOUS | Status: DC
  Filled 2015-03-18: qty 5

## 2015-03-18 MED ORDER — ONDANSETRON HCL 4 MG PO TABS: 4.0000 mg | ORAL_TABLET | Freq: Four times a day (QID) | ORAL | Status: DC | PRN

## 2015-03-18 MED ORDER — SODIUM CHLORIDE 0.9 % IV SOLN
INTRAVENOUS | Status: DC
  Administered 2015-03-18 – 2015-03-22 (×7): via INTRAVENOUS

## 2015-03-18 MED ORDER — CARVEDILOL 6.25 MG PO TABS: 6.2500 mg | ORAL_TABLET | Freq: Two times a day (BID) | ORAL | Status: DC

## 2015-03-18 MED ORDER — ROSUVASTATIN CALCIUM 10 MG PO TABS
10.0000 mg | ORAL_TABLET | Freq: Every day | ORAL | Status: DC
  Administered 2015-03-19 – 2015-03-21 (×4): 10 mg via ORAL
  Filled 2015-03-18 (×5): qty 1

## 2015-03-18 MED ORDER — HEPARIN SODIUM (PORCINE) 5000 UNIT/ML IJ SOLN
5000.0000 [IU] | Freq: Three times a day (TID) | INTRAMUSCULAR | Status: DC
  Administered 2015-03-18: 5000 [IU] via SUBCUTANEOUS
  Filled 2015-03-18: qty 1

## 2015-03-18 MED ORDER — DORZOLAMIDE HCL-TIMOLOL MAL 2-0.5 % OP SOLN
1.0000 [drp] | Freq: Two times a day (BID) | OPHTHALMIC | Status: DC
  Administered 2015-03-19 – 2015-03-22 (×8): 1 [drp] via OPHTHALMIC
  Filled 2015-03-18 (×2): qty 10

## 2015-03-18 MED ORDER — ACETAMINOPHEN 650 MG RE SUPP: 650.0000 mg | Freq: Four times a day (QID) | RECTAL | Status: DC | PRN

## 2015-03-18 MED ORDER — INSULIN ASPART 100 UNIT/ML ~~LOC~~ SOLN
0.0000 [IU] | Freq: Three times a day (TID) | SUBCUTANEOUS | Status: DC
  Administered 2015-03-19: 3 [IU] via SUBCUTANEOUS

## 2015-03-18 MED ORDER — BRIMONIDINE TARTRATE 0.15 % OP SOLN
1.0000 [drp] | Freq: Three times a day (TID) | OPHTHALMIC | Status: DC
  Filled 2015-03-18: qty 5

## 2015-03-18 MED ORDER — VERAPAMIL HCL ER 240 MG PO TBCR
240.0000 mg | EXTENDED_RELEASE_TABLET | Freq: Every day | ORAL | Status: DC
  Administered 2015-03-18 – 2015-03-21 (×4): 240 mg via ORAL
  Filled 2015-03-18 (×4): qty 1

## 2015-03-18 MED ORDER — SENNOSIDES-DOCUSATE SODIUM 8.6-50 MG PO TABS
1.0000 | ORAL_TABLET | Freq: Every evening | ORAL | Status: DC | PRN
  Filled 2015-03-18: qty 1

## 2015-03-18 MED ORDER — FINASTERIDE 5 MG PO TABS
5.0000 mg | ORAL_TABLET | Freq: Every day | ORAL | Status: DC
  Administered 2015-03-19 – 2015-03-22 (×4): 5 mg via ORAL
  Filled 2015-03-18 (×4): qty 1

## 2015-03-18 MED ORDER — ACETAMINOPHEN 325 MG PO TABS
650.0000 mg | ORAL_TABLET | Freq: Four times a day (QID) | ORAL | Status: DC | PRN
  Filled 2015-03-18: qty 2

## 2015-03-18 MED ORDER — ADULT MULTIVITAMIN W/MINERALS CH
1.0000 | ORAL_TABLET | Freq: Every day | ORAL | Status: DC
  Administered 2015-03-18 – 2015-03-22 (×5): 1 via ORAL
  Filled 2015-03-18 (×5): qty 1

## 2015-03-18 MED ORDER — DOCUSATE SODIUM 100 MG PO CAPS
100.0000 mg | ORAL_CAPSULE | Freq: Two times a day (BID) | ORAL | Status: DC
  Administered 2015-03-18 – 2015-03-19 (×2): 100 mg via ORAL
  Filled 2015-03-18 (×3): qty 1

## 2015-03-18 MED ORDER — BRIMONIDINE TARTRATE 0.2 % OP SOLN
1.0000 [drp] | Freq: Three times a day (TID) | OPHTHALMIC | Status: DC
  Administered 2015-03-18 – 2015-03-22 (×12): 1 [drp] via OPHTHALMIC
  Filled 2015-03-18 (×3): qty 5

## 2015-03-18 MED ORDER — GUAIFENESIN ER 600 MG PO TB12
1200.0000 mg | ORAL_TABLET | Freq: Two times a day (BID) | ORAL | Status: DC
  Administered 2015-03-18 – 2015-03-22 (×8): 1200 mg via ORAL
  Filled 2015-03-18 (×8): qty 2

## 2015-03-18 MED ORDER — FOLIC ACID 1 MG PO TABS
1.0000 mg | ORAL_TABLET | Freq: Every day | ORAL | Status: DC
  Administered 2015-03-18 – 2015-03-22 (×5): 1 mg via ORAL
  Filled 2015-03-18 (×5): qty 1

## 2015-03-18 NOTE — ED Notes (Signed)
Tiquan Bouch (brother) - Home [phone] Cell [phone]   ** Call brother if status changes or pt goes upstairs before brother returns

## 2015-03-18 NOTE — H&P (Signed)
[facility] History and Physical  Kent Mayo IEP:[id number] DOB: 1926/11/15 DOA: 03/18/2015  Referring physician: Kirstie Peri, MD PCP: No primary care provider on file.   Chief Complaint: failure to thrive  HPI: Kent Mayo is a 81 y.o. male with history of CVA DM HTN legally blind presents with failure to thrive. Patient is hard of hearing and is a difficult historian. Patient states that he lives home alone and he has a lady that takes care of him. She apparently was admitted to the hospital recently and he has not had anyone to help him. He has been having a poor appetite and has been not eating well. He states that he has been losing weight also. Patient also admits to generalized weakness. Patient states he is a diabetic his sugars have been under control he thinks. Patient states that he has had no fevers no abdominal pain no diarrhea. He has had no chest pain noted. He has no nausea or vomiting. His brother apparently brought him in today. He had been also seen by his PCP and was advised to come to the hospital for admission. Patient is legally blind. He was noted to be a little dehydrated on initial presentation.   Review of Systems:  12 point Systems reviewed and unremarkable other than HPI  Past Medical History  Diagnosis Date  . Diabetes mellitus without complication   . Hypertension   . Hearing difficulty of both ears     Bilateral hearing aids  . Blindness     Severe visual impairment OD, Blind OS  . Abnormality of gait   . Peripheral edema   . Glaucoma   . Dyslipidemia    Past Surgical History  Procedure Laterality Date  . Eye surgery     Social History:  reports that he has quit smoking. He does not have any smokeless tobacco history on file. He reports that he does not drink alcohol or use illicit drugs.  No Known Allergies  Family History  Problem Relation Age of Onset  . Hypertension Mother   . Hypertension Father   . Diabetes Mellitus  II Brother      Prior to Admission medications   Medication Sig Start Date End Date Taking? Authorizing Provider  Aliskiren-Hydrochlorothiazide (TEKTURNA HCT) 300-25 MG TABS Take 1 tablet by mouth daily.    Historical Provider, MD  ALPHAGAN P 0.1 % SOLN Place 1 drop into both eyes every 8 (eight) hours.  10/06/14   Historical Provider, MD  aspirin 81 MG tablet Take 81 mg by mouth daily.    Historical Provider, MD  carvedilol (COREG) 6.25 MG tablet Take 6.25 mg by mouth 2 (two) times daily. With food. 08/23/14   Historical Provider, MD  dorzolamide-timolol (COSOPT) 22.3-6.8 MG/ML ophthalmic solution Place 1 drop into the right eye 2 (two) times daily.    Historical Provider, MD  finasteride (PROSCAR) 5 MG tablet Take 5 mg by mouth daily. 10/11/14   Historical Provider, MD  furosemide (LASIX) 40 MG tablet Take 40 mg by mouth daily. 09/24/14   Historical Provider, MD  guaiFENesin (MUCINEX) 600 MG 12 hr tablet Take 1,200 mg by mouth 2 (two) times daily.    Historical Provider, MD  ibuprofen (ADVIL,MOTRIN) 200 MG tablet Take 400 mg by mouth every 6 (six) hours as needed for moderate pain.    Historical Provider, MD  insulin aspart (NOVOLOG) 100 UNIT/ML injection Inject 0-9 Units into the skin 3 (three) times daily with meals. Patient taking differently: Inject  1 Units into the skin 3 (three) times daily with meals.  02/27/13   Hosie Poisson, MD  linagliptin (TRADJENTA) 5 MG TABS tablet Take 5 mg by mouth daily.    Historical Provider, MD  metFORMIN (GLUCOPHAGE) 500 MG tablet Take 500 mg by mouth 2 (two) times daily with a meal.  09/20/14   Historical Provider, MD  methazolamide (NEPTAZANE) 50 MG tablet Take 50 mg by mouth 2 (two) times daily. 10/06/14   Historical Provider, MD  nebivolol (BYSTOLIC) 5 MG tablet Take 5 mg by mouth daily.    Historical Provider, MD  rosuvastatin (CRESTOR) 10 MG tablet Take 10 mg by mouth at bedtime.     Historical Provider, MD  verapamil (CALAN-SR) 240 MG CR tablet Take 240 mg by  mouth at bedtime.    Historical Provider, MD   Physical Exam: Filed Vitals:   03/18/15 1615 03/18/15 1815 03/18/15 1830  BP: 147/73 140/79 155/91  Pulse: 82 77 81  Temp: 98.3 F (36.8 C)    TempSrc: Oral    Resp: 18 18 15   Height: 6' (1.829 m)    Weight: 120.203 kg (265 lb)    SpO2: 98% 100% 100%    Wt Readings from Last 3 Encounters:  03/18/15 120.203 kg (265 lb)  10/25/14 118.842 kg (262 lb)  04/26/13 118.842 kg (262 lb)    General:  Appears calm and comfortable Eyes: PERRL, normal irises & conjunctiva ENT: hard of  hearing, normal lips & tongue Neck: no LAD, masses or thyromegaly Cardiovascular: RRR, no m/r/g. +LE edema. Respiratory: CTA bilaterally, no w/r/r Abdomen: soft, ntnd Skin: no rash or induration seen on limited exam Musculoskeletal: has braces on bilateral LE for "leg swelling" Psychiatric: grossly normal mood and affect Neurologic: unable to assess          Labs on Admission:  Basic Metabolic Panel:  Recent Labs Lab 03/18/15 1623  NA 136  K 4.1  CL 106  CO2 20*  GLUCOSE 92  BUN 23*  CREATININE 1.77*  CALCIUM 9.7   Liver Function Tests: No results for input(s): AST, ALT, ALKPHOS, BILITOT, PROT, ALBUMIN in the last 168 hours. No results for input(s): LIPASE, AMYLASE in the last 168 hours. No results for input(s): AMMONIA in the last 168 hours. CBC:  Recent Labs Lab 03/18/15 1623  WBC 8.4  HGB 13.8  HCT 40.1  MCV 88.1  PLT 322   Cardiac Enzymes: No results for input(s): CKTOTAL, CKMB, CKMBINDEX, TROPONINI in the last 168 hours.  BNP (last 3 results) No results for input(s): BNP in the last 8760 hours.  ProBNP (last 3 results) No results for input(s): PROBNP in the last 8760 hours.  CBG: No results for input(s): GLUCAP in the last 168 hours.  Radiological Exams on Admission: No results found.    Assessment/Plan Principal Problem:   AKI (acute kidney injury) Active Problems:   HTN (hypertension)   Hyperlipidemia    History of CVA (cerebrovascular accident)   FTT (failure to thrive) in adult   1. AKI -patient has had poor intake and is likely dehydrated -will start on IVF hold lasix methazolamide and hold metformin -monitor labs  2. HTN -will continue with antihypertensives -monitor pressures consider alternative antihypertensives  3. FTT in Adult -will need social services evalaution for possible placement -PT OT ordered  4. Hyperlipidemia -on crestor will continue  5. DM Type 2 -will place on SSI -hold metformin for now -on tredjenta  6. History of CVA -currently stable  Code Status: full code (must indicate code status--if unknown or must be presumed, indicate so) DVT Prophylaxis:heparin Family Communication: none (indicate person spoken with, if applicable, with phone number if by telephone) Disposition Plan: home (indicate anticipated LOS)  Time spent: 8min  Deryl Giroux A [facility] Pager [phone]

## 2015-03-18 NOTE — ED Notes (Signed)
Family reports not eating x 3 weeks, pt lives alone and only drinking Boost drinks.   Having generalized fatigue and weakness and was sent here for possible admission/placement.

## 2015-03-18 NOTE — ED Notes (Signed)
Attempted in and out cath

## 2015-03-18 NOTE — ED Notes (Signed)
Attempted report 

## 2015-03-18 NOTE — ED Provider Notes (Signed)
History   Chief Complaint  Patient presents with  . Fatigue    HPI 81 year old male past medical history as below who presents to ED for fatigue and poor appetite. Per family member, patient has had very poor appetite for the past 3 weeks. Patient is generally weak as well. There is no report of any recent illness, fever, chills, cough, pain. Patient has been losing weight. He is unable to care for himself. Patient is also legally blind and deaf. Patient was seen by his primary care doctor who advised to come to the ED for admission/placement. History is limited at this time secondary to patient's known deafness. No other complaints at this time.  Past medical/surgical history, social history, medications, allergies and FH have been reviewed with patient and/or in documentation. Furthermore, if pt family or friend(s) present, additional historical information was obtained from them.  Past Medical History  Diagnosis Date  . Diabetes mellitus without complication   . Hypertension   . Hearing difficulty of both ears     Bilateral hearing aids  . Blindness     Severe visual impairment OD, Blind OS  . Abnormality of gait   . Peripheral edema   . Glaucoma   . Dyslipidemia    Past Surgical History  Procedure Laterality Date  . Eye surgery     Family History  Problem Relation Age of Onset  . Hypertension Mother   . Hypertension Father   . Diabetes Mellitus II Brother    Social History  Substance Use Topics  . Smoking status: Former Research scientist (life sciences)  . Smokeless tobacco: None  . Alcohol Use: No     Review of Systems Constitutional: - F/C, +fatigue. poor appetite HENT: - congestion, -rhinorrhea, -sore throat.   Eyes: - eye pain, -visual disturbance.  Respiratory: - cough, -SOB, -hemoptysis.   Cardiovascular: - CP, -palps.  Gastrointestinal: - N/V/D, -abd pain  Genitourinary: - flank pain, -dysuria, -frequency.  Musculoskeletal: - myalgia/arthritis, -joint swelling, -gait  abnormality, -back pain, -neck pain/stiffness, -leg pain/swelling.  Skin: - rash/lesion.  Neurological: - focal weakness, -lightheadedness, -dizziness, -numbness, -HA.  All other systems reviewed and are negative.   Physical Exam  Physical Exam  ED Triage Vitals  Enc Vitals Group     BP 03/18/15 1615 147/73 mmHg     Pulse Rate 03/18/15 1615 82     Resp 03/18/15 1615 18     Temp 03/18/15 1615 98.3 F (36.8 C)     Temp Source 03/18/15 1615 Oral     SpO2 03/18/15 1615 98 %     Weight 03/18/15 1615 265 lb (120.203 kg)     Height 03/18/15 1615 6' (1.829 m)     Head Cir --      Peak Flow --      Pain Score --      Pain Loc --      Pain Edu? --      Excl. in [place]? --    Constitutional: Chronically ill-appearing elderly male in no apparent distress. Patient is very poor of hearing and is legally blind. Head: Normocephalic and atraumatic.  Eyes: Extraocular motion intact, no scleral icterus Mouth: MMM, OP clear Neck: Supple without meningismus, mass, or overt JVD Respiratory: No respiratory distress. Normal WOB. No w/r/g.  CV: RRR, no obvious murmurs.  Pulses +2 and symmetric. Euvolemic  Abdomen: Soft, NT, ND, no r/g. No mass.  MSK: Extremities are atraumatic without deformity, ROM intact Skin: Warm, dry, intact without rash Neuro: AAOx4, MAE 5/5  sym, no focal deficit noted   ED Course  Procedures   Labs Reviewed  BASIC METABOLIC PANEL - Abnormal; Notable for the following:    CO2 20 (*)    BUN 23 (*)    Creatinine, Ser 1.77 (*)    GFR calc non Af Amer 33 (*)    GFR calc Af Amer 38 (*)    All other components within normal limits  CBC  GLUCOSE, CAPILLARY  TSH  HEMOGLOBIN A1C  OCCULT BLOOD X 1 CARD TO LAB, STOOL  CBC  COMPREHENSIVE METABOLIC PANEL   I personally reviewed and interpreted all labs.  No results found. I personally viewed above image(s) which were used in my medical decision making. Formal interpretations by Radiology.   EKG  Interpretation  Date/Time:    Ventricular Rate:    PR Interval:    QRS Duration:   QT Interval:    QTC Calculation:   R Axis:     Text Interpretation:         MDM: STERLING MONDO is a 81 y.o. male with H&P as above who p/w CC: Fatigue, failure to thrive  On arrival, patient is hemodynamically stable and in no apparent distress. Screening labs were notable for AKI. Patient was given IV fluids. Patient will be admitted for failure to thrive.  Old records reviewed (if available). Labs and imaging reviewed personally by myself and considered in medical decision making if ordered.  Clinical Impression: 1. AKI (acute kidney injury)   2. FTT (failure to thrive) in adult    Disposition: Admit  Condition: stable  I have discussed the results, Dx and Tx plan with the pt(& family if present). He/she/they expressed understanding and agree(s) with the plan.  Pt seen in conjunction with Dr. Rosendo Gros, [facility] Resident - PGY-3     Kirstie Peri, MD 03/18/15 7867  Davonna Belling, MD 03/19/15 [phone]

## 2015-03-18 NOTE — ED Notes (Signed)
Pt belongings at bedside: brown pants, white shirt, blue pattern shirt, 5 quarters, 5 dimes, 4 nickels, 4 pennies, yellow key, sunglasses, black shoes, brown shiny cane, and brown knife.   Pt belongings sent with brother Alam Guterrez) [place] and [phone].

## 2015-03-19 ENCOUNTER — Inpatient Hospital Stay (HOSPITAL_COMMUNITY): Payer: Medicare Other

## 2015-03-19 DIAGNOSIS — K5900 Constipation, unspecified: Secondary | ICD-10-CM | POA: Insufficient documentation

## 2015-03-19 DIAGNOSIS — D72829 Elevated white blood cell count, unspecified: Secondary | ICD-10-CM | POA: Insufficient documentation

## 2015-03-19 LAB — COMPREHENSIVE METABOLIC PANEL
ALBUMIN: 3.5 g/dL (ref 3.5–5.0)
ALK PHOS: 96 U/L (ref 38–126)
ALT: 8 U/L — AB (ref 17–63)
ANION GAP: 12 (ref 5–15)
AST: 38 U/L (ref 15–41)
BILIRUBIN TOTAL: 1 mg/dL (ref 0.3–1.2)
BUN: 21 mg/dL — AB (ref 6–20)
CALCIUM: 9.3 mg/dL (ref 8.9–10.3)
CO2: 17 mmol/L — AB (ref 22–32)
Chloride: 106 mmol/L (ref 101–111)
Creatinine, Ser: 1.9 mg/dL — ABNORMAL HIGH (ref 0.61–1.24)
GFR calc Af Amer: 35 mL/min — ABNORMAL LOW (ref >60)
GFR calc non Af Amer: 30 mL/min — ABNORMAL LOW (ref >60)
GLUCOSE: 159 mg/dL — AB (ref 65–99)
Potassium: 3.9 mmol/L (ref 3.5–5.1)
SODIUM: 135 mmol/L (ref 135–145)
TOTAL PROTEIN: 8 g/dL (ref 6.5–8.1)

## 2015-03-19 LAB — CBC
HEMATOCRIT: 39.8 % (ref 39.0–52.0)
HEMOGLOBIN: 13.8 g/dL (ref 13.0–17.0)
MCH: 30.4 pg (ref 26.0–34.0)
MCHC: 34.7 g/dL (ref 30.0–36.0)
MCV: 87.7 fL (ref 78.0–100.0)
Platelets: 317 10*3/uL (ref 150–400)
RBC: 4.54 MIL/uL (ref 4.22–5.81)
RDW: 13.9 % (ref 11.5–15.5)
WBC: 13.7 10*3/uL — AB (ref 4.0–10.5)

## 2015-03-19 LAB — URINALYSIS, ROUTINE W REFLEX MICROSCOPIC
Bilirubin Urine: NEGATIVE
Glucose, UA: NEGATIVE mg/dL
Ketones, ur: 15 mg/dL — AB
Nitrite: NEGATIVE
PH: 6.5 (ref 5.0–8.0)
Protein, ur: NEGATIVE mg/dL
SPECIFIC GRAVITY, URINE: 1.015 (ref 1.005–1.030)
UROBILINOGEN UA: 0.2 mg/dL (ref 0.0–1.0)

## 2015-03-19 LAB — GLUCOSE, CAPILLARY
GLUCOSE-CAPILLARY: 62 mg/dL — AB (ref 65–99)
GLUCOSE-CAPILLARY: 107 mg/dL — AB (ref 65–99)
GLUCOSE-CAPILLARY: 138 mg/dL — AB (ref 65–99)
Glucose-Capillary: 111 mg/dL — ABNORMAL HIGH (ref 65–99)
Glucose-Capillary: 167 mg/dL — ABNORMAL HIGH (ref 65–99)

## 2015-03-19 LAB — URINE MICROSCOPIC-ADD ON

## 2015-03-19 LAB — CREATININE, URINE, RANDOM: Creatinine, Urine: 78.81 mg/dL

## 2015-03-19 LAB — TSH: TSH: 2.38 u[IU]/mL (ref 0.350–4.500)

## 2015-03-19 LAB — SODIUM, URINE, RANDOM: Sodium, Ur: 116 mmol/L

## 2015-03-19 MED ORDER — LIDOCAINE HCL 2 % EX GEL: 1.0000 "application " | Freq: Once | CUTANEOUS | Status: DC

## 2015-03-19 MED ORDER — SORBITOL 70 % SOLN
30.0000 mL | Freq: Once | Status: AC
  Administered 2015-03-19: 30 mL via ORAL
  Filled 2015-03-19: qty 30

## 2015-03-19 MED ORDER — POLYETHYLENE GLYCOL 3350 17 G PO PACK
17.0000 g | PACK | Freq: Two times a day (BID) | ORAL | Status: DC
  Administered 2015-03-19 – 2015-03-22 (×6): 17 g via ORAL
  Filled 2015-03-19 (×6): qty 1

## 2015-03-19 MED ORDER — CARVEDILOL 6.25 MG PO TABS
6.2500 mg | ORAL_TABLET | Freq: Two times a day (BID) | ORAL | Status: DC
  Administered 2015-03-19 – 2015-03-22 (×7): 6.25 mg via ORAL
  Filled 2015-03-19 (×7): qty 1

## 2015-03-19 MED ORDER — NEBIVOLOL HCL 10 MG PO TABS
5.0000 mg | ORAL_TABLET | Freq: Every day | ORAL | Status: DC
  Administered 2015-03-20 – 2015-03-21 (×2): 5 mg via ORAL
  Filled 2015-03-19 (×2): qty 1

## 2015-03-19 MED ORDER — LORAZEPAM 2 MG/ML IJ SOLN
0.5000 mg | Freq: Three times a day (TID) | INTRAMUSCULAR | Status: DC | PRN
  Administered 2015-03-22: 0.5 mg via INTRAVENOUS
  Filled 2015-03-19: qty 1

## 2015-03-19 MED ORDER — SENNOSIDES-DOCUSATE SODIUM 8.6-50 MG PO TABS
2.0000 | ORAL_TABLET | Freq: Every day | ORAL | Status: DC
  Administered 2015-03-19 – 2015-03-21 (×3): 2 via ORAL
  Filled 2015-03-19 (×3): qty 2

## 2015-03-19 MED ORDER — LIDOCAINE HCL 2 % EX GEL
1.0000 "application " | Freq: Once | CUTANEOUS | Status: DC
  Filled 2015-03-19: qty 20

## 2015-03-19 MED ORDER — POLYETHYLENE GLYCOL 3350 17 G PO PACK: 17.0000 g | PACK | Freq: Every day | ORAL | Status: DC | PRN

## 2015-03-19 MED ORDER — LIDOCAINE HCL 2 % EX GEL
1.0000 "application " | Freq: Once | CUTANEOUS | Status: AC
  Administered 2015-03-19: 1 via URETHRAL
  Filled 2015-03-19: qty 5

## 2015-03-19 MED ORDER — BELLADONNA ALKALOIDS-OPIUM 16.2-60 MG RE SUPP
1.0000 | Freq: Four times a day (QID) | RECTAL | Status: DC | PRN
  Administered 2015-03-19 – 2015-03-20 (×2): 1 via RECTAL
  Filled 2015-03-19 (×2): qty 1

## 2015-03-19 NOTE — Progress Notes (Signed)
Noted some urine leaking from the penis even with foley on. Irrigated foley, no resistance met. Still with gross hematuria. Urology Md on call notified with some order. Opium-belladona supp.for bladder spasm q6 prn.Said to do bladder scan and call him back if its >216m. Bladder scan done, shows 1218min the bladder. Will continue to monitor pt.

## 2015-03-19 NOTE — Consult Note (Addendum)
Urology Consult   Physician requesting consult: Humphrey Rolls, MD  Reason for consult: acute urinary retention  History of Present Illness: CECILE GUEVARA is a 81 y.o. M with history of CVA, DM, HTN and legal blindness who is admitted to [facility] hospital for failure to thrive. He is a difficult historian but denies any prior urologic history or procedures on his prostate or urethra as well as pelvic or abdominal radiation. He lives alone and was admitted due to failure to thrive and poor PO intake. Per the medical record he denies having fevers or abdominal pain, nausea or emesis. His serum creatinine was noted to be elevated at 1.77 from baseline of <1. He was unable to void throughout the entirety of the day and failed I and O catheterization x 4 and attempted foley catheter placement x 2. >700 cc was noted on bladder scan. Urology was consulted for difficult catheter placement.  He denies a history of voiding or storage urinary symptoms, hematuria, UTIs, STDs, urolithiasis, GU malignancy/trauma/surgery.  Past Medical History  Diagnosis Date  . Diabetes mellitus without complication   . Hypertension   . Hearing difficulty of both ears     Bilateral hearing aids  . Blindness     Severe visual impairment OD, Blind OS  . Abnormality of gait   . Peripheral edema   . Glaucoma   . Dyslipidemia     Past Surgical History  Procedure Laterality Date  . Eye surgery      Current Hospital Medications:  Home Meds:    Medication List    ASK your doctor about these medications        ALPHAGAN P 0.1 % Soln  Generic drug:  brimonidine  Place 1 drop into both eyes every 8 (eight) hours.     aspirin 81 MG tablet  Take 81 mg by mouth daily.     carvedilol 6.25 MG tablet  Commonly known as:  COREG  Take 6.25 mg by mouth 2 (two) times daily. With food.     dorzolamide-timolol 22.3-6.8 MG/ML ophthalmic solution  Commonly known as:  COSOPT  Place 1 drop into the right eye 2 (two) times daily.      finasteride 5 MG tablet  Commonly known as:  PROSCAR  Take 5 mg by mouth daily.     furosemide 40 MG tablet  Commonly known as:  LASIX  Take 40 mg by mouth daily.     guaiFENesin 600 MG 12 hr tablet  Commonly known as:  MUCINEX  Take 1,200 mg by mouth 2 (two) times daily.     ibuprofen 200 MG tablet  Commonly known as:  ADVIL,MOTRIN  Take 400 mg by mouth every 6 (six) hours as needed for moderate pain.     insulin aspart 100 UNIT/ML injection  Commonly known as:  novoLOG  Inject 0-9 Units into the skin 3 (three) times daily with meals.     linagliptin 5 MG Tabs tablet  Commonly known as:  TRADJENTA  Take 5 mg by mouth daily.     metFORMIN 500 MG tablet  Commonly known as:  GLUCOPHAGE  Take 500 mg by mouth 2 (two) times daily with a meal.     methazolamide 50 MG tablet  Commonly known as:  NEPTAZANE  Take 50 mg by mouth 2 (two) times daily.     nebivolol 5 MG tablet  Commonly known as:  BYSTOLIC  Take 5 mg by mouth daily.     rosuvastatin 10 MG tablet  Commonly known as:  CRESTOR  Take 10 mg by mouth at bedtime.     TEKTURNA HCT 300-25 MG Tabs  Generic drug:  Aliskiren-Hydrochlorothiazide  Take 1 tablet by mouth daily.     verapamil 240 MG CR tablet  Commonly known as:  CALAN-SR  Take 240 mg by mouth at bedtime.        Scheduled Meds: . aspirin  81 mg Oral Daily  . brimonidine  1 drop Both Eyes TID  . carvedilol  6.25 mg Oral BID  . docusate sodium  100 mg Oral BID  . dorzolamide-timolol  1 drop Right Eye BID  . finasteride  5 mg Oral Daily  . folic acid  1 mg Oral Daily  . guaiFENesin  1,200 mg Oral BID  . heparin  5,000 Units Subcutaneous 3 times per day  . insulin aspart  0-15 Units Subcutaneous TID WC  . lidocaine  1 application Urethral Once  . linagliptin  5 mg Oral Daily  . multivitamin with minerals  1 tablet Oral Daily  . nebivolol  5 mg Oral Daily  . rosuvastatin  10 mg Oral QHS  . thiamine  100 mg Oral Daily  . verapamil  240 mg Oral  QHS   Continuous Infusions: . sodium chloride 75 mL/hr at 03/18/15 2112   PRN Meds:.acetaminophen **OR** acetaminophen, ondansetron **OR** ondansetron (ZOFRAN) IV, senna-docusate  Allergies: No Known Allergies  Family History  Problem Relation Age of Onset  . Hypertension Mother   . Hypertension Father   . Diabetes Mellitus II Brother     Social History:  reports that he has quit smoking. He does not have any smokeless tobacco history on file. He reports that he does not drink alcohol or use illicit drugs.  ROS: A complete review of systems was performed.  All systems are negative except for pertinent findings as noted.  Physical Exam:  Vital signs in last 24 hours: Temp:  [97.5 F (36.4 C)-98.3 F (36.8 C)] 97.5 F (36.4 C) (08/15 2058) Pulse Rate:  [72-82] 72 (08/15 2058) Resp:  [11-19] 19 (08/15 2058) BP: (140-187)/(73-91) 187/90 mmHg (08/15 2058) SpO2:  [98 %-100 %] 100 % (08/15 2058) Weight:  [120.203 kg (265 lb)] 120.203 kg (265 lb) (08/15 1615) Constitutional:  Alert and oriented, No acute distress Cardiovascular: Regular rate and rhythm, No JVD Respiratory: Normal respiratory effort GI: suprapubic distention and pain on palpation, remainder of abdomen nondistended, no abdominal masses GU: No CVA tenderness, penis is uncircumcised, no lesions or abnormalities noted Lymphatic: No lymphadenopathy Neurologic: Grossly intact, no focal deficits Psychiatric: Normal mood and affect  Laboratory Data:   Recent Labs  03/18/15 1623  WBC 8.4  HGB 13.8  HCT 40.1  PLT 322     Recent Labs  03/18/15 1623  NA 136  K 4.1  CL 106  GLUCOSE 92  BUN 23*  CALCIUM 9.7  CREATININE 1.77*     Results for orders placed or performed during the hospital encounter of 03/18/15 (from the past 24 hour(s))  Basic metabolic panel     Status: Abnormal   Collection Time: 03/18/15  4:23 PM  Result Value Ref Range   Sodium 136 135 - 145 mmol/L   Potassium 4.1 3.5 - 5.1 mmol/L    Chloride 106 101 - 111 mmol/L   CO2 20 (L) 22 - 32 mmol/L   Glucose, Bld 92 65 - 99 mg/dL   BUN 23 (H) 6 - 20 mg/dL   Creatinine, Ser 1.77 (H)  0.61 - 1.24 mg/dL   Calcium 9.7 8.9 - 10.3 mg/dL   GFR calc non Af Amer 33 (L) >60 mL/min   GFR calc Af Amer 38 (L) >60 mL/min   Anion gap 10 5 - 15  CBC     Status: None   Collection Time: 03/18/15  4:23 PM  Result Value Ref Range   WBC 8.4 4.0 - 10.5 K/uL   RBC 4.55 4.22 - 5.81 MIL/uL   Hemoglobin 13.8 13.0 - 17.0 g/dL   HCT 40.1 39.0 - 52.0 %   MCV 88.1 78.0 - 100.0 fL   MCH 30.3 26.0 - 34.0 pg   MCHC 34.4 30.0 - 36.0 g/dL   RDW 14.0 11.5 - 15.5 %   Platelets 322 150 - 400 K/uL  Glucose, capillary     Status: None   Collection Time: 03/18/15 10:05 PM  Result Value Ref Range   Glucose-Capillary 90 65 - 99 mg/dL   No results found for this or any previous visit (from the past 240 hour(s)).  Renal Function:  Recent Labs  03/18/15 1623  CREATININE 1.77*   Estimated Creatinine Clearance: 40.1 mL/min (by C-G formula based on Cr of 1.77).  Radiologic Imaging: No results found.  Procedures: Foley Catheter Placemenet--Complex  Patient was prepped and draped in the usual sterile fashion using betadine solution. 2% viscous lidocaine was inserted per urethra. A 0.035 angled glidewire was advanced per urethra without significant resistance or recoiling. Upon passage through the bladder neck with the wire urine was noted to emanate from the urethral meatus. A 16 Fr council catheter was placed via the Blitz technique and passed easily with immediate return of >700 cc of clear, amber colored urine without clot. 10 cc sterile water was placed in the balloon port. Prepuce was returned to normal anatomic position.  Impression/Recommendation [phone] with acute urinary retention in the setting of AKI and failure to thrive. Foley catheter placed with return of >700 cc of clear urine.  -Please send urine for UA and culture -Foley catheter to remain  in place x 7 days for bladder rest given likely stretch injury -Trial of void can either be performed as inpatient if remains in house or can follow-up at [facility]  -Urology will be available if additional issues persist with respect to catheter drainage or any new urologic issue  Dr. Jeffie Pollock was available  Star Age 03/19/2015, 2:12 AM     Mr. Hoge is well known to me.  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  He is on finasteride, but has failed f/u with me since 2/15.     I have discussed his case with Dr. Ottis Stain and reviewed the pertinent history and results.   I have interviewed and examined the patient.    His urine is bloody at this time but the foley is draining well.   The bleeding is probably related to acute bladder decompression with mucosal cracking.    He will need the foley for at least two weeks and then will need f/u in my office if he is able for cystoscopy and a voiding trial.  I will arrange f/u.   Please consider holding heparin and using SCD's in the interim until the hematuria subsides.

## 2015-03-19 NOTE — Progress Notes (Signed)
Pt found to have nondraining foley with a pool of blood surrounding pt with blood drain around penis. .  Irrigated foley with 60 cc NSS with return of several small clots.  Draining hematuric urine.  Pt cleaned of  Blood, linen changed.  Handoff to [place], [place] to monitor urine drainage.

## 2015-03-19 NOTE — Progress Notes (Signed)
Patient very agitated and attempting to pull foley catheter out of place. Patient has bleeding noticed around foley at tip of penis. Mittens applied to patient. MD notified.

## 2015-03-19 NOTE — Progress Notes (Signed)
Hypoglycemic Event  CBG: 62   Treatment: 15 GM carbohydrate snack  Symptoms: Nervous/irritable  Follow-up CBG: Time:1757 CBG Result:107  Possible Reasons for Event: Inadequate meal intake  Comments/MD notified:   Suzan Nailer  Remember to initiate Hypoglycemia Order Set & complete

## 2015-03-19 NOTE — Evaluation (Signed)
Occupational Therapy Evaluation Patient Details Name: Kent Mayo MRN: [id number] DOB: [birth year] Today's Date: 03/19/2015    History of Present Illness Kent Mayo is a 81 y.o. male with history of CVA DM HTN legally blind presents with failure to thrive. Patient is hard of hearing and is a difficult historian.    Clinical Impression   PT admitted with FTT. Pt currently with functional limitiations due to the deficits listed below (see OT problem list). PTA living at home with caregiver (A) for adls and meals. Pt received meals on wheels. Pt will benefit from skilled OT to increase their independence and safety with adls and balance to allow discharge SNF.     Follow Up Recommendations  SNF    Equipment Recommendations   (defer SNf)    Recommendations for Other Services       Precautions / Restrictions Precautions Precautions: Fall Precaution Comments: pt legally blind and HOH with bilat hearing aides Restrictions Weight Bearing Restrictions: No      Mobility Bed Mobility Overal bed mobility: Needs Assistance Bed Mobility: Supine to Sit     Supine to sit: Mod assist;HOB elevated     General bed mobility comments: cues for sequence  Transfers Overall transfer level: Needs assistance Equipment used: Rolling walker (2 wheeled) Transfers: Sit to/from Stand Sit to Stand: +2 physical assistance;Mod assist         General transfer comment: cues for safety and hand placement. pt with strong posterior lean. pt reports dizziness x2 days upon transfer    Balance Overall balance assessment: Needs assistance Sitting-balance support: Bilateral upper extremity supported;Feet supported Sitting balance-Leahy Scale: Fair     Standing balance support: Single extremity supported Standing balance-Leahy Scale: Poor Standing balance comment: due to vision impairment and weakness pt with increased falls risk                            ADL Overall  ADL's : Needs assistance/impaired Eating/Feeding: Set up;Sitting Eating/Feeding Details (indicate cue type and reason): cues for setup and place arrangement. Pt onces setup able to use fork to eat Grooming: Wash/dry face;Set up;Sitting Grooming Details (indicate cue type and reason): cues to complete task                 Toilet Transfer: +2 for physical assistance;Maximal assistance Toilet Transfer Details (indicate cue type and reason): cues for safety adn posterior lean   Toileting - Clothing Manipulation Details (indicate cue type and reason): noted to have blood in foley. pt noted to have blood at penis where foley inserted     Functional mobility during ADLs: Moderate assistance;+2 for physical assistance General ADL Comments: Pt requires incr cues for transfer and demonstrates posterior lean. pt requesting cane but provided RW due to balance deficits. Pt s brother present and helping with information about PTA     Vision Vision Assessment?: Vision impaired- to be further tested in functional context (brother reports pt is legally blind and sees some shadows)   Perception     Praxis      Pertinent Vitals/Pain Pain Assessment: No/denies pain     Hand Dominance Right   Extremity/Trunk Assessment Upper Extremity Assessment Upper Extremity Assessment: Generalized weakness   Lower Extremity Assessment Lower Extremity Assessment: Defer to PT evaluation   Cervical / Trunk Assessment Cervical / Trunk Assessment: Normal   Communication Communication Communication: HOH   Cognition Arousal/Alertness: Awake/alert Behavior During Therapy: Restless Overall Cognitive Status: Impaired/Different  from baseline Area of Impairment: Orientation;Following commands;Awareness;Problem solving;Safety/judgement;Memory;Attention Orientation Level: Place;Time Current Attention Level: Sustained Memory: Decreased short-term memory Following Commands: Follows one step commands  consistently Safety/Judgement: Decreased awareness of safety;Decreased awareness of deficits Awareness: Intellectual Problem Solving: Slow processing;Difficulty sequencing;Requires verbal cues;Requires tactile cues General Comments: requires therapist to repeat information due to poor recall. Therapist having patient repeat back information to rule out [place] Comments       Exercises       Shoulder Instructions      Home Living Family/patient expects to be discharged to:: Skilled nursing facility                                 Additional Comments: pt lived alone in a 2nd floor apartment and had a personal care attendent to cook, clean and run to the grocery store. Pt has been eating only boost shakes and has meals on wheels delivered to condo      Prior Functioning/Environment Level of Independence: Needs assistance  Gait / Transfers Assistance Needed: uses cane, can only go down the stairs if someone is there to assist ADL's / Homemaking Assistance Needed: hired help does cleaning and cooking, he does bathing and dressing        OT Diagnosis: Generalized weakness;Cognitive deficits   OT Problem List: Decreased strength;Decreased activity tolerance;Impaired balance (sitting and/or standing);Decreased coordination;Decreased cognition;Decreased safety awareness;Decreased knowledge of use of DME or AE   OT Treatment/Interventions: Self-care/ADL training;Therapeutic exercise;DME and/or AE instruction;Therapeutic activities;Cognitive remediation/compensation;Patient/family education;Balance training    OT Goals(Current goals can be found in the care plan section) Acute Rehab OT Goals Patient Stated Goal: didn't state OT Goal Formulation: With patient/family Time For Goal Achievement: 04/02/15 Potential to Achieve Goals: Good  OT Frequency: Min 2X/week   Barriers to D/C: Decreased caregiver support          Co-evaluation              End of  Session Equipment Utilized During Treatment: Gait belt;Rolling walker Nurse Communication: Mobility status;Precautions (restraints not on due to eating breakfast present)  Activity Tolerance: Patient tolerated treatment well Patient left: in chair;with chair alarm set;with call bell/phone within reach;with family/visitor present   Time: 2229-7989 OT Time Calculation (min): 19 min Charges:  OT General Charges $OT Visit: 1 Procedure OT Evaluation $Initial OT Evaluation Tier I: 1 Procedure G-Codes:    Parke Poisson B 04/08/2015, 2:59 PM   Jeri Modena   OTR/L Pager: [phone] Office: [phone] .

## 2015-03-19 NOTE — Progress Notes (Signed)
[facility] PROGRESS NOTE  Kent Mayo SFK:[id number] DOB: January 05, 1927 DOA: 03/18/2015 PCP: No primary care provider on file.  Assessment/Plan: #1 acute renal failure Questionable etiology. Patient with slight worsening renal function. Check a fractional excretion of sodium. Urinalysis is negative for proteinuria. Urinalysis is nitrite negative with trace leukocytes. Patient is status post Foley catheter placement for urinary retention. Continue IV hydration. Monitor renal function. If no improvement in renal function tomorrow will need a renal ultrasound. Follow.  #2 urinary retention Patient with a history of BPH with by mouth and elevated PSA in the past on finasteride being followed by urology as per hasn't seen his urologist since February 2015. Patient is status post Foley catheter placement per urology early this morning with greater than 700 mL of clear urine output. Continue Proscar. Per urology Foley catheter will need to remain for at least 2 weeks with outpatient follow-up with urology for possible cystoscopy and voiding trial.  #3 failure to thrive Questionable etiology. Urinalysis negative for UTI. Chest x-ray negative for any acute pulmonary symptoms. Patient is afebrile. No signs or symptoms of infection. CBC were normal hemoglobin however does have a leukocytosis. PT/OT. Likely need a skilled nursing facility.  4 leukocytosis Likely reactive. Urinalysis is negative. Chest x-ray is negative. Patient afebrile. Will monitor for now.  #5 diabetes mellitus II Hemoglobin A1c from 02/25/2013 was 6.6. CBGs have ranged from 60-267. DC check enter. Sliding scale insulin.  #6 constipation Continue Colace. Placed on MiraLAX twice daily. Senokot-S daily at bedtime. Will give a dose of sorbitol. If no bowel movement may need a soapsuds enema.  #7 hyperlipidemia Continue Crestor.  #8 hypertension Continue current regimen of Coreg, Proscar,bysrolic, verapamil.  #9  prophylaxis SCDs for DVT prophylaxis.  Code Status: Full Family Communication: No family at bedside. Disposition Plan: Likely need skilled nursing facility pending PT evaluation.   Consultants:  Urology: Dr.Wrenn 03/19/2015  Procedures:  Chest x-ray 03/19/2015  Antibiotics:  None  HPI/Subjective: Per nursing patient was agitated earlier on. Patient currently sleeping. Patient with mittens on.  Objective: Filed Vitals:   03/19/15 1511  BP: 156/85  Pulse: 82  Temp: 98 F (36.7 C)  Resp: 18    Intake/Output Summary (Last 24 hours) at 03/19/15 2110 Last data filed at 03/19/15 1500  Gross per 24 hour  Intake   1185 ml  Output   2200 ml  Net  -1015 ml   Filed Weights   03/18/15 1615 03/19/15 0500  Weight: 120.203 kg (265 lb) 121.01 kg (266 lb 12.5 oz)    Exam:   General:  NAD  Cardiovascular: RRR  Respiratory: CTAB  Abdomen: Soft, nontender, nondistended, positive bowel sounds.  Musculoskeletal: No clubbing cyanosis or edema.  Data Reviewed: Basic Metabolic Panel:  Recent Labs Lab 03/18/15 1623 03/19/15 0238  NA 136 135  K 4.1 3.9  CL 106 106  CO2 20* 17*  GLUCOSE 92 159*  BUN 23* 21*  CREATININE 1.77* 1.90*  CALCIUM 9.7 9.3   Liver Function Tests:  Recent Labs Lab 03/19/15 0238  AST 38  ALT 8*  ALKPHOS 96  BILITOT 1.0  PROT 8.0  ALBUMIN 3.5   No results for input(s): LIPASE, AMYLASE in the last 168 hours. No results for input(s): AMMONIA in the last 168 hours. CBC:  Recent Labs Lab 03/18/15 1623 03/19/15 0238  WBC 8.4 13.7*  HGB 13.8 13.8  HCT 40.1 39.8  MCV 88.1 87.7  PLT 322 317   Cardiac Enzymes: No results for input(s):  CKTOTAL, CKMB, CKMBINDEX, TROPONINI in the last 168 hours. BNP (last 3 results) No results for input(s): BNP in the last 8760 hours.  ProBNP (last 3 results) No results for input(s): PROBNP in the last 8760 hours.  CBG:  Recent Labs Lab 03/18/15 2205 03/19/15 0726 03/19/15 1234  03/19/15 1719 03/19/15 1757  GLUCAP 90 111* 167* 62* 107*    No results found for this or any previous visit (from the past 240 hour(s)).   Studies: Dg Chest 2 View  03/19/2015   CLINICAL DATA:  Elevated white blood cell count, history of previous CVA, diabetes, and acute renal insufficiency.  EXAM: CHEST  2 VIEW  COMPARISON:  PA and lateral chest x-ray of February 22, 2013  FINDINGS: The lungs are adequately inflated and clear. There is chronic scarring at the left lung base. The cardiac silhouette remains enlarged. The pulmonary vascularity is normal. There is no pleural effusion. The mediastinum is normal in width. There is mild tortuosity of the ascending and descending thoracic aorta. The bony thorax exhibits no acute abnormality.  IMPRESSION: No active cardiopulmonary disease.  Stable mild cardiomegaly.   Electronically Signed   By: David  Martinique M.D.   On: 03/19/2015 11:53    Scheduled Meds: . aspirin  81 mg Oral Daily  . brimonidine  1 drop Both Eyes TID  . carvedilol  6.25 mg Oral BID  . docusate sodium  100 mg Oral BID  . dorzolamide-timolol  1 drop Right Eye BID  . finasteride  5 mg Oral Daily  . folic acid  1 mg Oral Daily  . guaiFENesin  1,200 mg Oral BID  . insulin aspart  0-15 Units Subcutaneous TID WC  . lidocaine  1 application Urethral Once  . linagliptin  5 mg Oral Daily  . multivitamin with minerals  1 tablet Oral Daily  . nebivolol  5 mg Oral Daily  . rosuvastatin  10 mg Oral QHS  . thiamine  100 mg Oral Daily  . verapamil  240 mg Oral QHS   Continuous Infusions: . sodium chloride 100 mL/hr at 03/19/15 1035    Principal Problem:   ARF (acute renal failure) Active Problems:   HTN (hypertension)   Hyperlipidemia   History of CVA (cerebrovascular accident)   FTT (failure to thrive) in adult    Time spent: 68 minutes    [name] M.D. [facility] Pager [phone]. If 7PM-7AM, please contact night-coverage at [email or web address], password  [facility] 03/19/2015, 9:10 PM  LOS: 1 day

## 2015-03-19 NOTE — Progress Notes (Signed)
Gross hematuria still noted.Irrigated Foley with 54ml NS. Noted few clot coming out. Some urine leaking from his penis. Endorsed to AM RN to monitor urine output and patency of catheter.

## 2015-03-19 NOTE — Progress Notes (Signed)
Utilization review completed. Dreamer Carillo, RN, BSN. 

## 2015-03-19 NOTE — Progress Notes (Signed)
Gross hematuria noted with some clots. Urine noted freely flowing. Dr Ottis Stain made aware, said it might be from catheter insertion and just monitor pt. Said as long as urine draining its okay.

## 2015-03-19 NOTE — Evaluation (Signed)
Physical Therapy Evaluation Patient Details Name: Kent Mayo MRN: [id number] DOB: 1927/05/21 Today's Date: 03/19/2015   History of Present Illness  Kent Mayo is a 81 y.o. male with history of CVA DM HTN legally blind presents with failure to thrive. Patient is hard of hearing and is a difficult historian.   Clinical Impression  Pt demo's both cognitive and functional deficits inhibiting his ability to care for himself at this time. Pt at increased falls risk. Pt to benefit from ST-SNF to achieve safe mod I level of function for safe transition home.    Follow Up Recommendations SNF;Supervision/Assistance - 24 hour    Equipment Recommendations  None recommended by PT    Recommendations for Other Services       Precautions / Restrictions Precautions Precautions: Fall Precaution Comments: pt legally blind and HOH with bilat hearing aides Restrictions Weight Bearing Restrictions: No      Mobility  Bed Mobility               General bed mobility comments: pt up in chair upon PT arrival  Transfers Overall transfer level: Needs assistance Equipment used: Straight cane Transfers: Sit to/from Stand Sit to Stand: Min assist         General transfer comment: minA to steady patient, v/c's to push up from chair, increased time  Ambulation/Gait Ambulation/Gait assistance: Min assist Ambulation Distance (Feet): 100 Feet Assistive device: Straight cane Gait Pattern/deviations: Step-to pattern;Shuffle;Wide base of support Gait velocity: slow Gait velocity interpretation: <1.8 ft/sec, indicative of risk for recurrent falls General Gait Details: pt very unsteady but will only use straight cane because "that's what I've been using, I don't need a walker" pt with lateral sway  Stairs            Wheelchair Mobility    Modified Rankin (Stroke Patients Only)       Balance Overall balance assessment: Needs assistance         Standing balance support:  Single extremity supported Standing balance-Leahy Scale: Poor Standing balance comment: due to vision impairment and weakness pt with increased falls risk                             Pertinent Vitals/Pain Pain Assessment: No/denies pain    Home Living Family/patient expects to be discharged to:: Skilled nursing facility                 Additional Comments: pt lived alone in a 2nd floor apartment and had a personal care attendent to cook, clean and run to the grocery store.    Prior Function Level of Independence: Needs assistance   Gait / Transfers Assistance Needed: uses cane, can only go down the stairs if someone is there to assist  ADL's / Homemaking Assistance Needed: hired help does cleaning and cooking, he does bathing and dressing        Hand Dominance   Dominant Hand: Right    Extremity/Trunk Assessment   Upper Extremity Assessment: Defer to OT evaluation           Lower Extremity Assessment: Generalized weakness      Cervical / Trunk Assessment: Normal  Communication   Communication: HOH  Cognition Arousal/Alertness: Awake/alert Behavior During Therapy: Restless (has bilat mits for hands) Overall Cognitive Status: Impaired/Different from baseline Area of Impairment: Orientation;Following commands;Awareness;Problem solving;Safety/judgement;Memory;Attention Orientation Level: Place;Time (had to orient to [facility]) Current Attention Level: Sustained Memory: Decreased short-term memory Following  Commands: Follows one step commands consistently Safety/Judgement: Decreased awareness of safety;Decreased awareness of deficits Awareness: Intellectual Problem Solving: Slow processing;Difficulty sequencing;Requires verbal cues;Requires tactile cues General Comments: pt agitated regarding the bilat hand mits    General Comments      Exercises        Assessment/Plan    PT Assessment Patient needs continued PT services  PT Diagnosis  Difficulty walking   PT Problem List Decreased strength;Decreased range of motion;Decreased activity tolerance;Decreased balance;Decreased mobility;Decreased coordination  PT Treatment Interventions DME instruction;Gait training;Stair training;Functional mobility training;Therapeutic activities;Therapeutic exercise   PT Goals (Current goals can be found in the Care Plan section) Acute Rehab PT Goals Patient Stated Goal: didn't state PT Goal Formulation: Patient unable to participate in goal setting Time For Goal Achievement: 03/26/15 Potential to Achieve Goals: Fair    Frequency Min 2X/week   Barriers to discharge Decreased caregiver support lives alone    Co-evaluation               End of Session Equipment Utilized During Treatment: Gait belt Activity Tolerance: Patient tolerated treatment well Patient left: in chair;with call bell/phone within reach;with chair alarm set Nurse Communication: Mobility status (possible need for sitter due to restless/agitation)         Time: 3664-4034 PT Time Calculation (min) (ACUTE ONLY): 20 min   Charges:   PT Evaluation $Initial PT Evaluation Tier I: 1 Procedure     PT G CodesKingsley Callander 03/19/2015, 2:45 PM   Kittie Plater, PT, DPT Pager #: [phone] Office #: [phone]

## 2015-03-19 NOTE — Progress Notes (Addendum)
RN, Tracie Harrier, paged this NP secondary to pt not being able to void. Bladder scan over 700cc and RN says pt is uncomfortable. In review of chart, pt was admitted earlier tonight with AKI, dehydration and FTT. Per RN, ED RN attempted I&O cath without success. As of time of this note, 2 I&O caths, a Foley, and a Coude catheter have been attempted without success. Per RN, pt has some blood clots from penis after attempts. Call placed to Verdis Frederickson, MD on call for urology and asked him to come and place catheter. Discussed above info with Dr. Ottis Stain who said he would call and talk with RN first. Number given to Dr. Ottis Stain for Tracie Harrier, [place]. Clance Boll, NP [facility]: [place], RN stated Dr. Ottis Stain called her and instructed her to attempt a large coude placement. If she has difficulty, she will call him back. KJKG, NP

## 2015-03-20 DIAGNOSIS — N179 Acute kidney failure, unspecified: Principal | ICD-10-CM

## 2015-03-20 DIAGNOSIS — G9340 Encephalopathy, unspecified: Secondary | ICD-10-CM

## 2015-03-20 DIAGNOSIS — R627 Adult failure to thrive: Secondary | ICD-10-CM

## 2015-03-20 DIAGNOSIS — R339 Retention of urine, unspecified: Secondary | ICD-10-CM | POA: Diagnosis present

## 2015-03-20 DIAGNOSIS — I10 Essential (primary) hypertension: Secondary | ICD-10-CM

## 2015-03-20 LAB — GLUCOSE, CAPILLARY
GLUCOSE-CAPILLARY: 109 mg/dL — AB (ref 65–99)
Glucose-Capillary: 84 mg/dL (ref 65–99)
Glucose-Capillary: 115 mg/dL — ABNORMAL HIGH (ref 65–99)
Glucose-Capillary: 140 mg/dL — ABNORMAL HIGH (ref 65–99)

## 2015-03-20 LAB — CBC
HCT: 36.4 % — ABNORMAL LOW (ref 39.0–52.0)
HEMOGLOBIN: 12.3 g/dL — AB (ref 13.0–17.0)
MCH: 29.9 pg (ref 26.0–34.0)
MCHC: 33.8 g/dL (ref 30.0–36.0)
MCV: 88.3 fL (ref 78.0–100.0)
PLATELETS: 280 10*3/uL (ref 150–400)
RBC: 4.12 MIL/uL — AB (ref 4.22–5.81)
RDW: 14 % (ref 11.5–15.5)
WBC: 11.5 10*3/uL — ABNORMAL HIGH (ref 4.0–10.5)

## 2015-03-20 LAB — BASIC METABOLIC PANEL
ANION GAP: 7 (ref 5–15)
BUN: 17 mg/dL (ref 6–20)
CALCIUM: 8.7 mg/dL — AB (ref 8.9–10.3)
CO2: 21 mmol/L — ABNORMAL LOW (ref 22–32)
CREATININE: 1.48 mg/dL — AB (ref 0.61–1.24)
Chloride: 106 mmol/L (ref 101–111)
GFR, EST AFRICAN AMERICAN: 48 mL/min — AB (ref >60)
GFR, EST NON AFRICAN AMERICAN: 41 mL/min — AB (ref >60)
Glucose, Bld: 89 mg/dL (ref 65–99)
Potassium: 4.2 mmol/L (ref 3.5–5.1)
Sodium: 134 mmol/L — ABNORMAL LOW (ref 135–145)

## 2015-03-20 LAB — URINE CULTURE: CULTURE: NO GROWTH

## 2015-03-20 LAB — HEMOGLOBIN A1C
HEMOGLOBIN A1C: 5.1 % (ref 4.8–5.6)
MEAN PLASMA GLUCOSE: 100 mg/dL

## 2015-03-20 LAB — MAGNESIUM: Magnesium: 2 mg/dL (ref 1.7–2.4)

## 2015-03-20 MED ORDER — MIRABEGRON ER 25 MG PO TB24
25.0000 mg | ORAL_TABLET | Freq: Every day | ORAL | Status: DC
  Administered 2015-03-20 – 2015-03-22 (×3): 25 mg via ORAL
  Filled 2015-03-20 (×3): qty 1

## 2015-03-20 NOTE — Assessment & Plan Note (Addendum)
Hematuria is improving and he doesn't report further bladder spasms.  He is doing well on Myrbetriq 25mg .  He is scheduled for f/u with me on 8/29 and I will consider a voiding trial and cystoscopy at that time.   He will need to stop the Myrbetriq 2 days prior to his f/u.

## 2015-03-20 NOTE — Progress Notes (Signed)
PROGRESS NOTE  Kent Mayo YTK:[id number] DOB: 1926/09/18 DOA: 03/18/2015 PCP: No primary care provider on file.  Assessment/Plan: acute renal failure -Due to volume depletion -Patient with slight worsening renal function.  -FeNa < 1% -Urinalysis is negative for proteinuria.  -Patient is status post Foley catheter placement for urinary retention.  -Continue IV hydration--renal function improving  urinary retention Patient with a history of BPH and elevated PSA in the past on finasteride being followed by urology as per hasn't seen his urologist since February 2015.  -Patient is status post Foley catheter placement per urology 03/19/15 with greater than 700 mL of clear urine output.  -Continue Proscar. Per urology Foley catheter will need to remain for at least 2 weeks with outpatient follow-up with urology for possible cystoscopy and voiding trial. -appreciate Dr. Jeffie Mayo followup--added Mybetriq  failure to thrive Urinalysis negative for UTI.  -Chest x-ray negative for any acute pulmonary symptoms. Patient is afebrile. No signs or symptoms of infection.  -CBC were normal hemoglobin however does have a leukocytosis. - PT/OT-->skilled nursing facility.  Acute encephalopathy -Brother states pt is more confused than usual x 2-3 week -Likely due to acute kidney injury -TSH 2.30 -Check ammonia -Serum B12  leukocytosis Likely reactive. Urinalysis is negative. Chest x-ray is negative. Patient afebrile. Will monitor for now.  diabetes mellitus II -03/19/2015 hemoglobin A1c 5.1 -Discontinue CBG checks  constipation -Continue Colace. Placed on MiraLAX twice daily. Senokot-S daily at bedtime.   hyperlipidemia Continue Crestor.  hypertension Continue current regimen of Coreg,  Verapamil. -Discontinue bystolic  prophylaxis SCDs for DVT prophylaxis.  Code Status: Full Family Communication: No family at bedside. Disposition Plan: Likely need skilled nursing  facility pending PT evaluation.   Consultants: Urology: Dr.Wrenn 03/19/2015     Family Communication:   Brother updated at beside Disposition Plan:   SNF  In 1-2 days          Procedures/Studies: Dg Chest 2 View  03/19/2015   CLINICAL DATA:  Elevated white blood cell count, history of previous CVA, diabetes, and acute renal insufficiency.  EXAM: CHEST  2 VIEW  COMPARISON:  PA and lateral chest x-ray of February 22, 2013  FINDINGS: The lungs are adequately inflated and clear. There is chronic scarring at the left lung base. The cardiac silhouette remains enlarged. The pulmonary vascularity is normal. There is no pleural effusion. The mediastinum is normal in width. There is mild tortuosity of the ascending and descending thoracic aorta. The bony thorax exhibits no acute abnormality.  IMPRESSION: No active cardiopulmonary disease.  Stable mild cardiomegaly.   Electronically Signed   By: Kent Longmire  Mayo M.D.   On: 03/19/2015 11:53         Subjective: Patient is pleasantly confused but denies any headache, fevers, chills, chest pain, shortness breath, vomiting, diarrhea, abdominal pain.  Objective: Filed Vitals:   03/19/15 0500 03/19/15 1511 03/19/15 2106 03/20/15 0518  BP:  156/85 148/86 130/54  Pulse:  82 89 68  Temp:  98 F (36.7 C) 98.1 F (36.7 C) 98.6 F (37 C)  TempSrc:  Oral Oral   Resp:  18 20 18   Height:      Weight: 121.01 kg (266 lb 12.5 oz)   122.4 kg (269 lb 13.5 oz)  SpO2:  100% 98% 96%    Intake/Output Summary (Last 24 hours) at 03/20/15 1552 Last data filed at 03/20/15 1100  Gross per 24 hour  Intake   1135 ml  Output   1150 ml  Net    -15 ml   Weight change: 2.197 kg (4 lb 13.5 oz) Exam:   General:  Pt is alert, follows commands appropriately, not in acute distress  HEENT: No icterus, No thrush, No neck mass, [place]/AT; no meningismus  Cardiovascular: RRR, S1/S2, no rubs, no gallops  Respiratory: CTA bilaterally, no wheezing, no crackles, no  rhonchi  Abdomen: Soft/+BS, non tender, non distended, no guarding  Extremities: No edema, No lymphangitis, No petechiae, No rashes, no synovitis  Data Reviewed: Basic Metabolic Panel:  Recent Labs Lab 03/18/15 1623 03/19/15 0238 03/20/15 0502  NA 136 135 134*  K 4.1 3.9 4.2  CL 106 106 106  CO2 20* 17* 21*  GLUCOSE 92 159* 89  BUN 23* 21* 17  CREATININE 1.77* 1.90* 1.48*  CALCIUM 9.7 9.3 8.7*  MG  --   --  2.0   Liver Function Tests:  Recent Labs Lab 03/19/15 0238  AST 38  ALT 8*  ALKPHOS 96  BILITOT 1.0  PROT 8.0  ALBUMIN 3.5   No results for input(s): LIPASE, AMYLASE in the last 168 hours. No results for input(s): AMMONIA in the last 168 hours. CBC:  Recent Labs Lab 03/18/15 1623 03/19/15 0238 03/20/15 0502  WBC 8.4 13.7* 11.5*  HGB 13.8 13.8 12.3*  HCT 40.1 39.8 36.4*  MCV 88.1 87.7 88.3  PLT 322 317 280   Cardiac Enzymes: No results for input(s): CKTOTAL, CKMB, CKMBINDEX, TROPONINI in the last 168 hours. BNP: Invalid input(s): POCBNP CBG:  Recent Labs Lab 03/19/15 1719 03/19/15 1757 03/19/15 2225 03/20/15 0724 03/20/15 1240  GLUCAP 62* 107* 138* 84 109*    Recent Results (from the past 240 hour(s))  Culture, Urine     Status: None   Collection Time: 03/19/15  2:29 AM  Result Value Ref Range Status   Specimen Description URINE, CATHETERIZED  Final   Special Requests NONE  Final   Culture NO GROWTH 1 DAY  Final   Report Status 03/20/2015 FINAL  Final     Scheduled Meds: . aspirin  81 mg Oral Daily  . brimonidine  1 drop Both Eyes TID  . carvedilol  6.25 mg Oral BID  . dorzolamide-timolol  1 drop Right Eye BID  . finasteride  5 mg Oral Daily  . folic acid  1 mg Oral Daily  . guaiFENesin  1,200 mg Oral BID  . insulin aspart  0-15 Units Subcutaneous TID WC  . lidocaine  1 application Urethral Once  . linagliptin  5 mg Oral Daily  . mirabegron ER  25 mg Oral Daily  . multivitamin with minerals  1 tablet Oral Daily  . nebivolol   5 mg Oral Daily  . polyethylene glycol  17 g Oral BID  . rosuvastatin  10 mg Oral QHS  . senna-docusate  2 tablet Oral QHS  . thiamine  100 mg Oral Daily  . verapamil  240 mg Oral QHS   Continuous Infusions: . sodium chloride 100 mL/hr at 03/20/15 1021     Kortni Hasten, DO  [facility] Pager [phone]  If 7PM-7AM, please contact night-coverage [email or web address] Password [email or web address] 03/20/2015, 3:52 PM   LOS: 2 days

## 2015-03-20 NOTE — Progress Notes (Signed)
Patient ID: COLA HIGHFILL, male   DOB: 04/22/1927, 81 y.o.   MRN: [id number]    Subjective: Mr. Kent Mayo has been leaking around the foley and has had bladder spasms despite B&O suppositories.   He has pain with the spasms.   His foley is draining well.  A bladder scan showed 149ml.   He has been irrigated with only occasional clot.  ROS:  Review of Systems  Constitutional: Negative for fever.  HENT: Positive for hearing loss.   Eyes:       Blind  Genitourinary: Positive for hematuria.    Anti-infectives: Anti-infectives    None      Current Facility-Administered Medications  Medication Dose Route Frequency Provider Last Rate Last Dose  . 0.9 %  sodium chloride infusion   Intravenous Continuous Eugenie Filler, MD 100 mL/hr at 03/19/15 2254    . acetaminophen (TYLENOL) tablet 650 mg  650 mg Oral Q6H PRN Allyne Gee, MD       Or  . acetaminophen (TYLENOL) suppository 650 mg  650 mg Rectal Q6H PRN Allyne Gee, MD      . aspirin chewable tablet 81 mg  81 mg Oral Daily Allyne Gee, MD   81 mg at 03/19/15 1042  . brimonidine (ALPHAGAN) 0.2 % ophthalmic solution 1 drop  1 drop Both Eyes TID Allyne Gee, MD   1 drop at 03/19/15 2128  . carvedilol (COREG) tablet 6.25 mg  6.25 mg Oral BID Eugenie Filler, MD   6.25 mg at 03/19/15 2129  . dorzolamide-timolol (COSOPT) 22.3-6.8 MG/ML ophthalmic solution 1 drop  1 drop Right Eye BID Allyne Gee, MD   1 drop at 03/19/15 2128  . finasteride (PROSCAR) tablet 5 mg  5 mg Oral Daily Allyne Gee, MD   5 mg at 03/19/15 1036  . folic acid (FOLVITE) tablet 1 mg  1 mg Oral Daily Allyne Gee, MD   1 mg at 03/19/15 1036  . guaiFENesin (MUCINEX) 12 hr tablet 1,200 mg  1,200 mg Oral BID Allyne Gee, MD   1,200 mg at 03/19/15 2129  . insulin aspart (novoLOG) injection 0-15 Units  0-15 Units Subcutaneous TID WC Allyne Gee, MD   3 Units at 03/19/15 1318  . lidocaine (XYLOCAINE) 2 % jelly 1 application  1 application Urethral Once Allyne Gee, MD      . linagliptin (TRADJENTA) tablet 5 mg  5 mg Oral Daily Allyne Gee, MD   5 mg at 03/19/15 1036  . LORazepam (ATIVAN) injection 0.5 mg  0.5 mg Intravenous Q8H PRN Eugenie Filler, MD      . mirabegron ER [facility]) tablet 25 mg  25 mg Oral Daily Irine Seal, MD      . multivitamin with minerals tablet 1 tablet  1 tablet Oral Daily Allyne Gee, MD   1 tablet at 03/19/15 1036  . nebivolol (BYSTOLIC) tablet 5 mg  5 mg Oral Daily Eugenie Filler, MD   Stopped at 03/19/15 1242  . ondansetron (ZOFRAN) tablet 4 mg  4 mg Oral Q6H PRN Allyne Gee, MD       Or  . ondansetron [facility]) injection 4 mg  4 mg Intravenous Q6H PRN Allyne Gee, MD      . opium-belladonna (B&O SUPPRETTES) 16.2-60 MG suppository 1 suppository  1 suppository Rectal Q6H PRN Ardis Hughs, MD   1 suppository at 03/19/15 2133  . polyethylene glycol (  MIRALAX / GLYCOLAX) packet 17 g  17 g Oral BID Eugenie Filler, MD   17 g at 03/19/15 2132  . rosuvastatin (CRESTOR) tablet 10 mg  10 mg Oral QHS Allyne Gee, MD   10 mg at 03/19/15 2129  . senna-docusate (Senokot-S) tablet 1 tablet  1 tablet Oral QHS PRN Allyne Gee, MD      . senna-docusate (Senokot-S) tablet 2 tablet  2 tablet Oral QHS Gardiner Barefoot, NP   2 tablet at 03/19/15 2132  . thiamine (VITAMIN B-1) tablet 100 mg  100 mg Oral Daily Allyne Gee, MD   100 mg at 03/19/15 1036  . verapamil (CALAN-SR) CR tablet 240 mg  240 mg Oral QHS Allyne Gee, MD   240 mg at 03/19/15 2129     Objective: Vital signs in last 24 hours: Temp:  [98 F (36.7 C)-98.6 F (37 C)] 98.6 F (37 C) (08/17 0518) Pulse Rate:  [68-89] 68 (08/17 0518) Resp:  [18-20] 18 (08/17 0518) BP: (130-156)/(54-86) 130/54 mmHg (08/17 0518) SpO2:  [96 %-100 %] 96 % (08/17 0518) Weight:  [122.4 kg (269 lb 13.5 oz)] 122.4 kg (269 lb 13.5 oz) (08/17 0518)  Intake/Output from previous day: 08/16 0701 - 08/17 0700 In: 7026 [P.O.:720; I.V.:825] Out: 1950  [Urine:1950] Intake/Output this shift: Total I/O In: 1065 [P.O.:240; I.V.:825] Out: 1150 [Urine:1150]   Physical Exam  Constitutional: He is well-developed, well-nourished, and in no distress.  Genitourinary: Penis normal.  There is some blood discharge from the penis and the urine remains slightly bloody.   Vitals reviewed.   Lab Results:   Recent Labs  03/19/15 0238 03/20/15 0502  WBC 13.7* 11.5*  HGB 13.8 12.3*  HCT 39.8 36.4*  PLT 317 280   BMET  Recent Labs  03/19/15 0238 03/20/15 0502  NA 135 134*  K 3.9 4.2  CL 106 106  CO2 17* 21*  GLUCOSE 159* 89  BUN 21* 17  CREATININE 1.90* 1.48*  CALCIUM 9.3 8.7*   PT/INR No results for input(s): LABPROT, INR in the last 72 hours. ABG No results for input(s): PHART, HCO3 in the last 72 hours.  Invalid input(s): PCO2, PO2  Studies/Results: Dg Chest 2 View  03/19/2015   CLINICAL DATA:  Elevated white blood cell count, history of previous CVA, diabetes, and acute renal insufficiency.  EXAM: CHEST  2 VIEW  COMPARISON:  PA and lateral chest x-ray of February 22, 2013  FINDINGS: The lungs are adequately inflated and clear. There is chronic scarring at the left lung base. The cardiac silhouette remains enlarged. The pulmonary vascularity is normal. There is no pleural effusion. The mediastinum is normal in width. There is mild tortuosity of the ascending and descending thoracic aorta. The bony thorax exhibits no acute abnormality.  IMPRESSION: No active cardiopulmonary disease.  Stable mild cardiomegaly.   Electronically Signed   By: David  Martinique M.D.   On: 03/19/2015 11:53   Labs and notes reviewed.   Assessment and Plan: Urinary retention He has had continued hematuria but it is better today.  He is having some bladder spasms that haven't resolved with a B&O suppository.  I irrigated his bladder with return of only scant clot and some clearing of the urine.  I will put him on Myrbetriq 25mg  for the spasms as that will be  less likely to cause constipation or cognitive changes.         LOS: 2 days    [name] 03/20/2015

## 2015-03-21 ENCOUNTER — Inpatient Hospital Stay (HOSPITAL_COMMUNITY): Payer: Medicare Other

## 2015-03-21 DIAGNOSIS — E7220 Disorder of urea cycle metabolism, unspecified: Secondary | ICD-10-CM | POA: Insufficient documentation

## 2015-03-21 LAB — GLUCOSE, CAPILLARY
GLUCOSE-CAPILLARY: 107 mg/dL — AB (ref 65–99)
GLUCOSE-CAPILLARY: 98 mg/dL (ref 65–99)
Glucose-Capillary: 83 mg/dL (ref 65–99)
Glucose-Capillary: 93 mg/dL (ref 65–99)

## 2015-03-21 LAB — BASIC METABOLIC PANEL
ANION GAP: 7 (ref 5–15)
BUN: 14 mg/dL (ref 6–20)
CALCIUM: 8.9 mg/dL (ref 8.9–10.3)
CO2: 20 mmol/L — ABNORMAL LOW (ref 22–32)
Chloride: 110 mmol/L (ref 101–111)
Creatinine, Ser: 1.23 mg/dL (ref 0.61–1.24)
GFR calc Af Amer: 59 mL/min — ABNORMAL LOW (ref >60)
GFR, EST NON AFRICAN AMERICAN: 51 mL/min — AB (ref >60)
GLUCOSE: 109 mg/dL — AB (ref 65–99)
Potassium: 4.3 mmol/L (ref 3.5–5.1)
Sodium: 137 mmol/L (ref 135–145)

## 2015-03-21 LAB — AMMONIA
AMMONIA: 60 umol/L — AB (ref 9–35)
AMMONIA: 32 umol/L (ref 9–35)

## 2015-03-21 LAB — CBC
HCT: 36.7 % — ABNORMAL LOW (ref 39.0–52.0)
Hemoglobin: 12.6 g/dL — ABNORMAL LOW (ref 13.0–17.0)
MCH: 30.9 pg (ref 26.0–34.0)
MCHC: 34.3 g/dL (ref 30.0–36.0)
MCV: 90 fL (ref 78.0–100.0)
Platelets: 279 10*3/uL (ref 150–400)
RBC: 4.08 MIL/uL — ABNORMAL LOW (ref 4.22–5.81)
RDW: 14.1 % (ref 11.5–15.5)
WBC: 12.5 10*3/uL — ABNORMAL HIGH (ref 4.0–10.5)

## 2015-03-21 LAB — VITAMIN B12: VITAMIN B 12: 553 pg/mL (ref 180–914)

## 2015-03-21 LAB — HEMOGLOBIN A1C
HEMOGLOBIN A1C: 5.1 % (ref 4.8–5.6)
MEAN PLASMA GLUCOSE: 100 mg/dL

## 2015-03-21 MED ORDER — LACTULOSE 10 GM/15ML PO SOLN
20.0000 g | Freq: Three times a day (TID) | ORAL | Status: DC
  Administered 2015-03-21 (×2): 20 g via ORAL
  Filled 2015-03-21 (×2): qty 30

## 2015-03-21 NOTE — Discharge Instructions (Signed)
Foley Catheter Care A Foley catheter is a soft, flexible tube. This tube is placed into your bladder to drain pee (urine). If you go home with this catheter in place, follow the instructions below. TAKING CARE OF THE CATHETER 1. Wash your hands with soap and water. 2. Put soap and water on a clean washcloth.  Clean the skin where the tube goes into your body.  Clean away from the tube site.  Never wipe toward the tube.  Clean the area using a circular motion.  Remove all the soap. Pat the area dry with a clean towel. For males, reposition the skin that covers the end of the penis (foreskin). 3. Attach the tube to your leg with tape or a leg strap. Do not stretch the tube tight. If you are using tape, remove any stickiness left behind by past tape you used. 4. Keep the drainage bag below your hips. Keep it off the floor. 5. Check your tube during the day. Make sure it is working and draining. Make sure the tube does not curl, twist, or bend. 6. Do not pull on the tube or try to take it out. TAKING CARE OF THE DRAINAGE BAGS You will have a large overnight drainage bag and a small leg bag. You may wear the overnight bag any time. Never wear the small bag at night. Follow the directions below. Emptying the Drainage Bag Empty your drainage bag when it is  - full or at least 2-3 times a day. 1. Wash your hands with soap and water. 2. Keep the drainage bag below your hips. 3. Hold the dirty bag over the toilet or clean container. 4. Open the pour spout at the bottom of the bag. Empty the pee into the toilet or container. Do not let the pour spout touch anything. 5. Clean the pour spout with a gauze pad or cotton ball that has rubbing alcohol on it. 6. Close the pour spout. 7. Attach the bag to your leg with tape or a leg strap. 8. Wash your hands well. Changing the Drainage Bag Change your bag once a month or sooner if it starts to smell or look dirty.  1. Wash your hands with soap and  water. 2. Pinch the rubber tube so that pee does not spill out. 3. Disconnect the catheter tube from the drainage tube at the connection valve. Do not let the tubes touch anything. 4. Clean the end of the catheter tube with an alcohol wipe. Clean the end of a the drainage tube with a different alcohol wipe. 5. Connect the catheter tube to the drainage tube of the clean drainage bag. 6. Attach the new bag to the leg with tape or a leg strap. Avoid attaching the new bag too tightly. 7. Wash your hands well. Cleaning the Drainage Bag 1. Wash your hands with soap and water. 2. Wash the bag in warm, soapy water. 3. Rinse the bag with warm water. 4. Fill the bag with a mixture of white vinegar and water (1 cup vinegar to 1 quart warm water [.2 liter vinegar to 1 liter warm water]). Close the bag and soak it for 30 minutes in the solution. 5. Rinse the bag with warm water. 6. Hang the bag to dry with the pour spout open and hanging downward. 7. Store the clean bag (once it is dry) in a clean plastic bag. 8. Wash your hands well. PREVENT INFECTION  Wash your hands before and after touching your tube.  Take showers every day. Wash the skin where the tube enters your body. Do not take baths. Replace wet leg straps with dry ones, if this applies.  Do not use powders, sprays, or lotions on the genital area. Only use creams, lotions, or ointments as told by your doctor.  For females, wipe from front to back after going to the bathroom.  Drink enough fluids to keep your pee clear or pale yellow unless you are told not to have too much fluid (fluid restriction).  Do not let the drainage bag or tubing touch or lie on the floor.  Wear cotton underwear to keep the area dry. GET HELP IF:  Your pee is cloudy or smells unusually bad.  Your tube becomes clogged.  You are not draining pee into the bag or your bladder feels full.  Your tube starts to leak. GET HELP RIGHT AWAY IF:  You have pain,  puffiness (swelling), redness, or yellowish-white fluid (pus) where the tube enters the body.  You have pain in the belly (abdomen), legs, lower back, or bladder.  You have a fever.  You see blood fill the tube, or your pee is pink or red.  You feel sick to your stomach (nauseous), throw up (vomit), or have chills.  Your tube gets pulled out. MAKE SURE YOU:   Understand these instructions.  Will watch your condition.  Will get help right away if you are not doing well or get worse. Document Released: 11/14/2012 Document Revised: 12/04/2013 Document Reviewed: 11/14/2012 [facility] Patient Information 2015 [place], [place]. This information is not intended to replace advice given to you by your health care provider. Make sure you discuss any questions you have with your health care provider.   Please stop the Myrbetriq on 8/27 in preparation for your follow up on 8/29.

## 2015-03-21 NOTE — Progress Notes (Signed)
Patient ID: Kent Mayo, male   DOB: 1927-06-09, 81 y.o.   MRN: [id number]    Subjective: [name] reports no pain from the catheter.  The hematuria appears to be improving but hasn't completely resolved.  He remains on Myrbetriq.  ROS:  Review of Systems  Constitutional: Negative for fever.  Gastrointestinal: Negative for abdominal pain.    Anti-infectives: Anti-infectives    None      Current Facility-Administered Medications  Medication Dose Route Frequency Provider Last Rate Last Dose  . 0.9 %  sodium chloride infusion   Intravenous Continuous Eugenie Filler, MD 100 mL/hr at 03/20/15 1021    . acetaminophen (TYLENOL) tablet 650 mg  650 mg Oral Q6H PRN Allyne Gee, MD       Or  . acetaminophen (TYLENOL) suppository 650 mg  650 mg Rectal Q6H PRN Allyne Gee, MD      . aspirin chewable tablet 81 mg  81 mg Oral Daily Allyne Gee, MD   81 mg at 03/20/15 1022  . brimonidine (ALPHAGAN) 0.2 % ophthalmic solution 1 drop  1 drop Both Eyes TID Allyne Gee, MD   1 drop at 03/20/15 2354  . carvedilol (COREG) tablet 6.25 mg  6.25 mg Oral BID Eugenie Filler, MD   6.25 mg at 03/20/15 2353  . dorzolamide-timolol (COSOPT) 22.3-6.8 MG/ML ophthalmic solution 1 drop  1 drop Right Eye BID Allyne Gee, MD   1 drop at 03/20/15 2354  . finasteride (PROSCAR) tablet 5 mg  5 mg Oral Daily Allyne Gee, MD   5 mg at 03/20/15 1022  . folic acid (FOLVITE) tablet 1 mg  1 mg Oral Daily Allyne Gee, MD   1 mg at 03/20/15 1034  . guaiFENesin (MUCINEX) 12 hr tablet 1,200 mg  1,200 mg Oral BID Allyne Gee, MD   1,200 mg at 03/20/15 2353  . insulin aspart (novoLOG) injection 0-15 Units  0-15 Units Subcutaneous TID WC Allyne Gee, MD   3 Units at 03/19/15 1318  . lidocaine (XYLOCAINE) 2 % jelly 1 application  1 application Urethral Once Allyne Gee, MD      . linagliptin (TRADJENTA) tablet 5 mg  5 mg Oral Daily Allyne Gee, MD   5 mg at 03/20/15 1034  . LORazepam (ATIVAN) injection  0.5 mg  0.5 mg Intravenous Q8H PRN Eugenie Filler, MD      . mirabegron ER [facility]) tablet 25 mg  25 mg Oral Daily Irine Seal, MD   25 mg at 03/20/15 1022  . multivitamin with minerals tablet 1 tablet  1 tablet Oral Daily Allyne Gee, MD   1 tablet at 03/20/15 1022  . nebivolol (BYSTOLIC) tablet 5 mg  5 mg Oral Daily Eugenie Filler, MD   5 mg at 03/20/15 1023  . ondansetron (ZOFRAN) tablet 4 mg  4 mg Oral Q6H PRN Allyne Gee, MD       Or  . ondansetron (ZOFRAN) injection 4 mg  4 mg Intravenous Q6H PRN Allyne Gee, MD      . opium-belladonna (B&O SUPPRETTES) 16.2-60 MG suppository 1 suppository  1 suppository Rectal Q6H PRN Ardis Hughs, MD   1 suppository at 03/20/15 1758  . polyethylene glycol (MIRALAX / GLYCOLAX) packet 17 g  17 g Oral BID Eugenie Filler, MD   17 g at 03/20/15 2351  . rosuvastatin (CRESTOR) tablet 10 mg  10 mg Oral QHS  Allyne Gee, MD   10 mg at 03/20/15 2352  . senna-docusate (Senokot-S) tablet 1 tablet  1 tablet Oral QHS PRN Allyne Gee, MD      . senna-docusate (Senokot-S) tablet 2 tablet  2 tablet Oral QHS Gardiner Barefoot, NP   2 tablet at 03/20/15 2353  . thiamine (VITAMIN B-1) tablet 100 mg  100 mg Oral Daily Allyne Gee, MD   100 mg at 03/20/15 1022  . verapamil (CALAN-SR) CR tablet 240 mg  240 mg Oral QHS Allyne Gee, MD   240 mg at 03/20/15 2352     Objective: Vital signs in last 24 hours: Temp:  [97.4 F (36.3 C)-97.9 F (36.6 C)] 97.5 F (36.4 C) (08/18 0532) Pulse Rate:  [55-58] 58 (08/18 0532) Resp:  [16-18] 18 (08/18 0532) BP: (135-137)/(48-61) 135/61 mmHg (08/18 0532) SpO2:  [100 %] 100 % (08/18 0532)  Intake/Output from previous day: 08/17 0701 - 08/18 0700 In: 2790 [P.O.:390; I.V.:2400] Out: 1400 [Urine:1400] Intake/Output this shift:     Physical Exam  Constitutional: He is well-developed, well-nourished, and in no distress.  Genitourinary:  Urine in foley tubing is clearing.  There is no blood at the  meatus    Lab Results:   Recent Labs  03/20/15 0502 03/21/15 0532  WBC 11.5* PENDING  HGB 12.3* 12.6*  HCT 36.4* 36.7*  PLT 280 279   BMET  Recent Labs  03/19/15 0238 03/20/15 0502  NA 135 134*  K 3.9 4.2  CL 106 106  CO2 17* 21*  GLUCOSE 159* 89  BUN 21* 17  CREATININE 1.90* 1.48*  CALCIUM 9.3 8.7*   PT/INR No results for input(s): LABPROT, INR in the last 72 hours. ABG No results for input(s): PHART, HCO3 in the last 72 hours.  Invalid input(s): PCO2, PO2  Studies/Results: Dg Chest 2 View  03/19/2015   CLINICAL DATA:  Elevated white blood cell count, history of previous CVA, diabetes, and acute renal insufficiency.  EXAM: CHEST  2 VIEW  COMPARISON:  PA and lateral chest x-ray of February 22, 2013  FINDINGS: The lungs are adequately inflated and clear. There is chronic scarring at the left lung base. The cardiac silhouette remains enlarged. The pulmonary vascularity is normal. There is no pleural effusion. The mediastinum is normal in width. There is mild tortuosity of the ascending and descending thoracic aorta. The bony thorax exhibits no acute abnormality.  IMPRESSION: No active cardiopulmonary disease.  Stable mild cardiomegaly.   Electronically Signed   By: David  Martinique M.D.   On: 03/19/2015 11:53   Labs and notes reviewed.   Assessment and Plan: Urinary retention Hematuria is improving and he doesn't report further bladder spasms.  He is doing well on Myrbetriq 25mg .  He is scheduled for f/u with me on 8/29 and I will consider a voiding trial and cystoscopy at that time.   He will need to stop the Myrbetriq 2 days prior to his f/u.         LOS: 3 days    [name] 03/21/2015

## 2015-03-21 NOTE — Clinical Social Work Note (Signed)
Patient is alert though not oriented.  PT is recommending STR at time of discharge.  CSW contacted Rol[facility]cco to review SNF/STR process.  CSW was unable to reach [place]; however a message was left. CSW awaiting a return call and will proceed with SNF search.    Nonnie Done, LCSW [phone]  Psychiatric & Orthopedics (5N 1-8) Clinical Social Worker

## 2015-03-21 NOTE — Progress Notes (Signed)
Physical Therapy Treatment Patient Details Name: Kent Mayo MRN: [id number] DOB: 10/15/1926 Today's Date: 03/21/2015    History of Present Illness Kent Mayo is a 81 y.o. male with history of CVA DM HTN legally blind presents with failure to thrive. Patient is hard of hearing and is a difficult historian.     PT Comments    Patient progressing slowly towards PT goals. Balance much improved with RW vs SPC. Pt requires Mod A for balance/safety secondary to visual deficits and impaired balance. Pt high falls risk and very appropriate for ST SNF. Will follow acutely to maximize independence and mobility.   Follow Up Recommendations  SNF;Supervision/Assistance - 24 hour     Equipment Recommendations  None recommended by PT    Recommendations for Other Services       Precautions / Restrictions Precautions Precautions: Fall Precaution Comments: pt legally blind and HOH with bilat hearing aides Restrictions Weight Bearing Restrictions: No    Mobility  Bed Mobility Overal bed mobility: Needs Assistance Bed Mobility: Supine to Sit     Supine to sit: Mod assist;HOB elevated     General bed mobility comments: cues for sequence and reaching for therapist's hand for assist.   Transfers Overall transfer level: Needs assistance Equipment used: None Transfers: Sit to/from Stand Sit to Stand: Mod assist         General transfer comment: Cues for safety and hand placement. Strong posterior lean initially. No dizziness.   Ambulation/Gait Ambulation/Gait assistance: Mod assist Ambulation Distance (Feet): 100 Feet Assistive device: Rolling walker (2 wheeled);Straight cane Gait Pattern/deviations: Step-through pattern;Step-to pattern;Decreased stride length;Wide base of support Gait velocity: slow   General Gait Details: Ambulated a few feet within room with [facility] however very unsteady. Changed to RW. Mod A for RW management, balance as pt veering left and with right  lateral trunk lean. Verbal directional cues due to impaired vision/HOH/   Stairs            Wheelchair Mobility    Modified Rankin (Stroke Patients Only)       Balance Overall balance assessment: Needs assistance Sitting-balance support: Feet supported;Single extremity supported Sitting balance-Leahy Scale: Fair     Standing balance support: During functional activity Standing balance-Leahy Scale: Poor                      Cognition Arousal/Alertness: Awake/alert Behavior During Therapy: Flat affect Overall Cognitive Status: Impaired/Different from baseline Area of Impairment: Following commands;Safety/judgement;Awareness;Problem solving       Following Commands: Follows one step commands consistently Safety/Judgement: Decreased awareness of safety;Decreased awareness of deficits Awareness: Intellectual Problem Solving: Slow processing;Difficulty sequencing;Requires verbal cues;Requires tactile cues General Comments: Requires therapist to repeat information most likely due to [facility] and/or poor recall.    Exercises      General Comments        Pertinent Vitals/Pain Pain Assessment: No/denies pain    Home Living                      Prior Function            PT Goals (current goals can now be found in the care plan section) Progress towards PT goals: Progressing toward goals    Frequency  Min 2X/week    PT Plan Current plan remains appropriate    Co-evaluation             End of Session Equipment Utilized During Treatment: Gait belt Activity Tolerance:  Patient tolerated treatment well Patient left: in chair;with call bell/phone within reach;with chair alarm set     Time: [phone] PT Time Calculation (min) (ACUTE ONLY): 24 min  Charges:  $Gait Training: 23-37 mins                    G Codes:      Kent Mayo 03/21/2015, 11:04 AM Kent Mayo, PT, DPT [phone]

## 2015-03-21 NOTE — Progress Notes (Signed)
PROGRESS NOTE  Kent Mayo JOA:[id number] DOB: 1927-02-19 DOA: 03/18/2015 PCP: No primary care provider on file.  Assessment/Plan: acute renal failure -Due to volume depletion -Patient with slight worsening renal function.  -FeNa < 1% -Urinalysis is negative for proteinuria.  -Patient is status post Foley catheter placement for urinary retention.  -Continue IV hydration--renal function improving  urinary retention Patient with a history of BPH and elevated PSA in the past on finasteride being followed by urology as per hasn't seen his urologist since February 2015.  -Patient is status post Foley catheter placement per urology 03/19/15 with greater than 700 mL of clear urine output.  -Continue Proscar. Per urology Foley catheter will need to remain for at least 2 weeks with outpatient follow-up with urology for possible cystoscopy and voiding trial. -appreciate Dr. Jeffie Pollock followup--added Mybetriq -keep foley until followup with Dr. Jeffie Pollock on 8/29 @1 :15PM  failure to thrive Urinalysis negative for UTI.  -Chest x-ray negative for any acute pulmonary symptoms. Patient is afebrile. No signs or symptoms of infection.  -CBC were normal hemoglobin however does have a leukocytosis. - PT/OT-->skilled nursing facility.  Acute encephalopathy -Brother states pt is more confused than usual x 2-3 week -Likely due to acute kidney injury -TSH 2.30 -Check ammonia--60 -Serum B12--553  Hyperammonemia -unclear clinical significance as pt appears more alert than 8/17 -RUQ [place] to eval hepatic architecture -hep B surface antigen, hep C antibody -trial of lactulose and repeat ammonia  leukocytosis Likely reactive. Urinalysis is negative. Chest x-ray is negative. Patient afebrile and hemodynamically stable. Will monitor for now.  diabetes mellitus II -03/19/2015 hemoglobin A1c 5.1 -Discontinue CBG checks  constipation -Continue Colace. Placed on MiraLAX twice daily. Senokot-S  daily at bedtime.   hyperlipidemia Continue Crestor.  hypertension Continue current regimen of Coreg, Verapamil. -Discontinue bystolic  prophylaxis SCDs for DVT prophylaxis.  Code Status: Full Family Communication: No family at bedside. Disposition Plan: Likely need skilled nursing facility pending PT evaluation.   Consultants: Urology: Dr.Wrenn 03/19/2015     Family Communication: Brother updated at beside Disposition Plan: SNF In 1-2 days        Procedures/Studies: Dg Chest 2 View  03/19/2015   CLINICAL DATA:  Elevated white blood cell count, history of previous CVA, diabetes, and acute renal insufficiency.  EXAM: CHEST  2 VIEW  COMPARISON:  PA and lateral chest x-ray of February 22, 2013  FINDINGS: The lungs are adequately inflated and clear. There is chronic scarring at the left lung base. The cardiac silhouette remains enlarged. The pulmonary vascularity is normal. There is no pleural effusion. The mediastinum is normal in width. There is mild tortuosity of the ascending and descending thoracic aorta. The bony thorax exhibits no acute abnormality.  IMPRESSION: No active cardiopulmonary disease.  Stable mild cardiomegaly.   Electronically Signed   By: Jeanette Moffatt  [place] M.D.   On: 03/19/2015 11:53         Subjective: Patient denies fevers, chills, chest pain, shortness of breath, vomiting, abdominal pain, diarrhea. No dysuria or headache.  Objective: Filed Vitals:   03/20/15 1550 03/20/15 2227 03/21/15 0532 03/21/15 1345  BP: 137/60 137/48 135/61 122/63  Pulse: 55 55 58 49  Temp: 97.9 F (36.6 C) 97.4 F (36.3 C) 97.5 F (36.4 C) 97.8 F (36.6 C)  TempSrc: Oral Oral  Oral  Resp: 16 17 18 18   Height:      Weight:      SpO2: 100% 100% 100% 98%  Intake/Output Summary (Last 24 hours) at 03/21/15 1854 Last data filed at 03/21/15 1709  Gross per 24 hour  Intake   3508 ml  Output   1450 ml  Net   2058 ml   Weight change:  Exam:   General:  Pt is  alert, follows commands appropriately, not in acute distress  HEENT: No icterus, No thrush, No neck mass, [place]/AT  Cardiovascular: RRR, S1/S2, no rubs, no gallops  Respiratory: CTA bilaterally, no wheezing, no crackles, no rhonchi  Abdomen: Soft/+BS, non tender, non distended, no guarding; no hepatosplenomegaly  Extremities: No edema, No lymphangitis, No petechiae, No rashes, no synovitis no cyanosis or clubbing  Data Reviewed: Basic Metabolic Panel:  Recent Labs Lab 03/18/15 1623 03/19/15 0238 03/20/15 0502 03/21/15 0532  NA 136 135 134* 137  K 4.1 3.9 4.2 4.3  CL 106 106 106 110  CO2 20* 17* 21* 20*  GLUCOSE 92 159* 89 109*  BUN 23* 21* 17 14  CREATININE 1.77* 1.90* 1.48* 1.23  CALCIUM 9.7 9.3 8.7* 8.9  MG  --   --  2.0  --    Liver Function Tests:  Recent Labs Lab 03/19/15 0238  AST 38  ALT 8*  ALKPHOS 96  BILITOT 1.0  PROT 8.0  ALBUMIN 3.5   No results for input(s): LIPASE, AMYLASE in the last 168 hours.  Recent Labs Lab 03/21/15 0532  AMMONIA 60*   CBC:  Recent Labs Lab 03/18/15 1623 03/19/15 0238 03/20/15 0502 03/21/15 0532  WBC 8.4 13.7* 11.5* 12.5*  HGB 13.8 13.8 12.3* 12.6*  HCT 40.1 39.8 36.4* 36.7*  MCV 88.1 87.7 88.3 90.0  PLT 322 317 280 279   Cardiac Enzymes: No results for input(s): CKTOTAL, CKMB, CKMBINDEX, TROPONINI in the last 168 hours. BNP: Invalid input(s): POCBNP CBG:  Recent Labs Lab 03/20/15 1711 03/20/15 2223 03/21/15 0757 03/21/15 1217 03/21/15 1707  GLUCAP 115* 140* 83 93 107*    Recent Results (from the past 240 hour(s))  Culture, Urine     Status: None   Collection Time: 03/19/15  2:29 AM  Result Value Ref Range Status   Specimen Description URINE, CATHETERIZED  Final   Special Requests NONE  Final   Culture NO GROWTH 1 DAY  Final   Report Status 03/20/2015 FINAL  Final     Scheduled Meds: . aspirin  81 mg Oral Daily  . brimonidine  1 drop Both Eyes TID  . carvedilol  6.25 mg Oral BID  .  dorzolamide-timolol  1 drop Right Eye BID  . finasteride  5 mg Oral Daily  . folic acid  1 mg Oral Daily  . guaiFENesin  1,200 mg Oral BID  . insulin aspart  0-15 Units Subcutaneous TID WC  . lactulose  20 g Oral TID  . lidocaine  1 application Urethral Once  . linagliptin  5 mg Oral Daily  . mirabegron ER  25 mg Oral Daily  . multivitamin with minerals  1 tablet Oral Daily  . polyethylene glycol  17 g Oral BID  . rosuvastatin  10 mg Oral QHS  . senna-docusate  2 tablet Oral QHS  . thiamine  100 mg Oral Daily  . verapamil  240 mg Oral QHS   Continuous Infusions: . sodium chloride 100 mL/hr at 03/21/15 1100     Cara Thaxton, DO  [facility] Pager [phone]  If 7PM-7AM, please contact night-coverage [email or web address] Password [email or web address] 03/21/2015, 6:54 PM   LOS: 3 days

## 2015-03-21 NOTE — Care Management Important Message (Signed)
Important Message  Patient Details  Name: Kent Mayo MRN: [id number] Date of Birth: 02-Jul-1927   Medicare Important Message Given:  Yes-second notification given    Delorse Lek 03/21/2015, 2:29 PM

## 2015-03-22 LAB — BASIC METABOLIC PANEL
ANION GAP: 9 (ref 5–15)
BUN: 8 mg/dL (ref 6–20)
CO2: 20 mmol/L — ABNORMAL LOW (ref 22–32)
Calcium: 8.6 mg/dL — ABNORMAL LOW (ref 8.9–10.3)
Chloride: 107 mmol/L (ref 101–111)
Creatinine, Ser: 1.15 mg/dL (ref 0.61–1.24)
GFR, EST NON AFRICAN AMERICAN: 56 mL/min — AB (ref >60)
Glucose, Bld: 98 mg/dL (ref 65–99)
POTASSIUM: 3.7 mmol/L (ref 3.5–5.1)
SODIUM: 136 mmol/L (ref 135–145)

## 2015-03-22 LAB — GLUCOSE, CAPILLARY
Glucose-Capillary: 95 mg/dL (ref 65–99)
Glucose-Capillary: 92 mg/dL (ref 65–99)
Glucose-Capillary: 76 mg/dL (ref 65–99)

## 2015-03-22 MED ORDER — SENNOSIDES-DOCUSATE SODIUM 8.6-50 MG PO TABS: 1.0000 | ORAL_TABLET | Freq: Every evening | ORAL | Status: AC | PRN

## 2015-03-22 MED ORDER — ENSURE ENLIVE PO LIQD: 237.0000 mL | Freq: Two times a day (BID) | ORAL | Status: DC

## 2015-03-22 MED ORDER — MIRABEGRON ER 25 MG PO TB24: 25.0000 mg | ORAL_TABLET | Freq: Every day | ORAL | Status: DC

## 2015-03-22 MED ORDER — BELLADONNA ALKALOIDS-OPIUM 16.2-60 MG RE SUPP: 1.0000 | Freq: Four times a day (QID) | RECTAL | Status: DC | PRN

## 2015-03-22 NOTE — Discharge Summary (Signed)
Physician Discharge Summary  ADVAY VOLANTE SWN:[id number] DOB: [birth year] DOA: 03/18/2015  PCP: No primary care provider on file.  Admit date: 03/18/2015 Discharge date: 03/22/2015  Recommendations for Outpatient Follow-up:  1. Pt will need to follow up with PCP in 2 weeks post discharge 2. Please obtain BMP and CBC on 03/29/15 3. Follow up Dr. Keene Breath on 04/01/15 @ 1:15 PM 4. Discontinue Myrbetriq 25 mg daily through 03/29/15, then stop 5. Leave foley catheter in place until he sees Dr. Jeffie Pollock  Discharge Diagnoses:  Acute kidney injury -Due to volume depletion and NSAID -Serum creatinine improved with hydration -FeNa < 1% -Urinalysis is negative for proteinuria.  -Patient is status post Foley catheter placement for urinary retention.  -Continue IV hydration--renal function improving--encouraged oral intake -d/c lasix, no hx of CHF -d/c Tekturna HCT -d/c methazolamide -d/c ibuprofen  urinary retention Patient with a history of BPH and elevated PSA in the past on finasteride being followed by urology as per hasn't seen his urologist since February 2015.  -Patient is status post Foley catheter placement per urology 03/19/15 with greater than 700 mL of clear urine output.  -Continue Proscar. Per urology Foley catheter will need to remain for at least 2 weeks with outpatient follow-up with urology for possible cystoscopy and voiding trial. -appreciate Dr. Jeffie Pollock followup--added Myrbetriq -keep foley until followup with Dr. Jeffie Pollock on 8/29 @1 :15PM--per his recommendation, stop myrbetriq for 48 hours prior to appointment  failure to thrive Urinalysis negative for UTI.  -Chest x-ray negative for any acute pulmonary symptoms. Patient is afebrile. No signs or symptoms of infection.  -CBC were normal hemoglobin however does have a leukocytosis. - PT/OT-->skilled nursing facility. -Ensure supplements  Acute encephalopathy -Brother states pt is more confused than usual x 2-3  week -Likely due to acute kidney injury -improving with IVF, suspect underlying dementia component -TSH 2.30 -Check ammonia--60-->suspect spurious result as repeat was 32 without lactulose -Serum B12--553  Hyperammonemia -?spurious -unclear clinical significance as pt appears more alert than 8/17 and on 8/19 pt was much more alert -RUQ [place] to eval hepatic architecture--fatty liver -hep B surface antigen, hep C antibody--pending at time of d/c -trial of lactulose-->d/c and observe clinically -repeat ammonia--32 without lactulose  leukocytosis Likely reactive. Urinalysis is negative. Chest x-ray is negative. Patient afebrile and hemodynamically stable.  -Repeat CBC in 1 week  diabetes mellitus II -03/19/2015 hemoglobin A1c 5.1 -Discontinue CBG checks -Discontinue insulin -Discontinue metformin -Continued Tradjenta  constipation -Continue Colace. Placed on MiraLAX twice daily. Senokot-S daily at bedtime.   hyperlipidemia Continue Crestor.  hypertension Continue current regimen of Coreg, Verapamil. -Discontinue bystolic  Discharge Condition: stable  Disposition: SNF Follow-up Information    Follow up with [name], MD On 04/01/2015.   Specialty:  Urology   Why:  1:15pm   Contact information:   [place] [address] [phone]       Diet:carb modified Wt Readings from Last 3 Encounters:  03/22/15 103.9 kg (229 lb 0.9 oz)  10/25/14 118.842 kg (262 lb)  04/26/13 118.842 kg (262 lb)    History of present illness:  81 year old Mayo with a history of diabetes mellitus, hypertension, dyslipidemia, glaucoma presented with failure to thrive and confusion. The patient normally lives alone, but has a caretaker assists in his care. Unfortunately, the caretaker has hospitalized, and the patient has not been able to get any help. As result, he has had decreased oral intake and worsening confusion and generalized weakness. This been no reports of chest  pain,  shortness breath, vomiting, diarrhea. Patient was seen by his primary care provider who advised him to come to the hospital. Workup initially revealed acute kidney injury with serum creatinine 1.90. The patient was started on fluids. Renal function gradually improved. The patient had urinary retention. Urology was consulted. Dr. Jeffie Pollock saw the patient and placed a Foley catheter. The patient did have hematuria and bladder spasms.  He was given B&O suppositories which helped. Dr. Jeffie Pollock recommended keeping the Foley catheter until the patient follows up with him in the office on 04/01/2015. The patient's mental status gradually improved with hydration although he remained pleasantly confused. His brother agreed the patient likely has underlying dementia component.  Consultants: urology  Discharge Exam: Filed Vitals:   03/22/15 0500  BP: 150/63  Pulse: 55  Temp: 97.4 F (36.3 C)  Resp: 18   Filed Vitals:   03/21/15 0532 03/21/15 1345 03/21/15 2143 03/22/15 0500  BP: 135/61 122/63 176/65 150/63  Pulse: Kent 49 56 55  Temp: 97.5 F (36.4 C) 97.8 F (36.6 C) 97.5 F (36.4 C) 97.4 F (36.3 C)  TempSrc:  Oral Oral Oral  Resp: 18 18 17 18   Height:      Weight:    103.9 kg (229 lb 0.9 oz)  SpO2: 100% 98% 99% 100%   General: Awake and alert, NAD, pleasant, cooperative Cardiovascular: RRR, no rub, no gallop, no S3 Respiratory: CTAB, no wheeze, no rhonchi Abdomen:soft, nontender, nondistended, positive bowel sounds Extremities: No edema, No lymphangitis, no petechiae  Discharge Instructions      Discharge Instructions    Diet - low sodium heart healthy    Complete by:  As directed      Increase activity slowly    Complete by:  As directed             Medication List    STOP taking these medications        furosemide 40 MG tablet  Commonly known as:  LASIX     guaiFENesin 600 MG 12 hr tablet  Commonly known as:  MUCINEX     ibuprofen 200 MG tablet  Commonly known as:   ADVIL,MOTRIN     insulin aspart 100 UNIT/ML injection  Commonly known as:  novoLOG     metFORMIN 500 MG tablet  Commonly known as:  GLUCOPHAGE     methazolamide 50 MG tablet  Commonly known as:  NEPTAZANE     nebivolol 5 MG tablet  Commonly known as:  BYSTOLIC     TEKTURNA HCT 300-25 MG Tabs  Generic drug:  [place]ren-Hydrochlorothiazide      TAKE these medications        ALPHAGAN P 0.1 % Soln  Generic drug:  brimonidine  Place 1 drop into both eyes every 8 (eight) hours.     aspirin 81 MG tablet  Take 81 mg by mouth daily.     carvedilol 6.25 MG tablet  Commonly known as:  COREG  Take 6.25 mg by mouth 2 (two) times daily. With food.     dorzolamide-timolol 22.3-6.8 MG/ML ophthalmic solution  Commonly known as:  COSOPT  Place 1 drop into the right eye 2 (two) times daily.     feeding supplement (ENSURE ENLIVE) Liqd  Take 237 mLs by mouth 2 (two) times daily between meals.     finasteride 5 MG tablet  Commonly known as:  PROSCAR  Take 5 mg by mouth daily.     linagliptin 5 MG Tabs tablet  Commonly  known as:  TRADJENTA  Take 5 mg by mouth daily.     mirabegron ER 25 MG Tb24 tablet  Commonly known as:  MYRBETRIQ  Take 1 tablet (25 mg total) by mouth daily. Stop after last dose on 03/29/15     opium-belladonna 16.2-60 MG suppository  Commonly known as:  B&O SUPPRETTES  Place 1 suppository rectally every 6 (six) hours as needed for bladder spasms.     rosuvastatin 10 MG tablet  Commonly known as:  CRESTOR  Take 10 mg by mouth at bedtime.     senna-docusate 8.6-50 MG per tablet  Commonly known as:  Senokot-S  Take 1 tablet by mouth at bedtime as needed for mild constipation.     verapamil 240 MG CR tablet  Commonly known as:  CALAN-SR  Take 240 mg by mouth at bedtime.         The results of significant diagnostics from this hospitalization (including imaging, microbiology, ancillary and laboratory) are listed below for reference.    Significant  Diagnostic Studies: Dg Chest 2 View  03/19/2015   CLINICAL DATA:  Elevated white blood cell count, history of previous CVA, diabetes, and acute renal insufficiency.  EXAM: CHEST  2 VIEW  COMPARISON:  PA and lateral chest x-ray of February 22, 2013  FINDINGS: The lungs are adequately inflated and clear. There is chronic scarring at the left lung base. The cardiac silhouette remains enlarged. The pulmonary vascularity is normal. There is no pleural effusion. The mediastinum is normal in width. There is mild tortuosity of the ascending and descending thoracic aorta. The bony thorax exhibits no acute abnormality.  IMPRESSION: No active cardiopulmonary disease.  Stable mild cardiomegaly.   Electronically Signed   By: Carlean Crowl  [place] M.D.   On: 03/19/2015 11:53   US Abdomen Limited Ruq  03/21/2015   CLINICAL DATA:  Elevated liver enzymes  EXAM: US ABDOMEN LIMITED - RIGHT UPPER QUADRANT  COMPARISON:  None.  FINDINGS: Gallbladder:  No gallstones or wall thickening visualized. No pericholecystic fluid. A modest amount of sludge is noted in the gallbladder. No sonographic Murphy sign noted.  Common bile duct:  Diameter: 7 mm. No intrahepatic or extrahepatic biliary duct dilatation.  Liver:  No focal lesion identified. Liver echogenicity appears mildly increased.  IMPRESSION: Mild sludge in gallbladder. No gallstones seen. No gallbladder wall thickening or pericholecystic fluid.  Liver echogenicity appears mildly increased. Question a degree of hepatic steatosis. No focal liver lesions are identified. It should be noted that the sensitivity of ultrasound for focal liver lesions is somewhat diminished with underlying hepatic steatosis.   Electronically Signed   By: Lowella Grip III M.D.   On: 03/21/2015 19:35     Microbiology: Recent Results (from the past 240 hour(s))  Culture, Urine     Status: None   Collection Time: 03/19/15  2:29 AM  Result Value Ref Range Status   Specimen Description URINE, CATHETERIZED   Final   Special Requests NONE  Final   Culture NO GROWTH 1 DAY  Final   Report Status 03/20/2015 FINAL  Final     Labs: Basic Metabolic Panel:  Recent Labs Lab 03/18/15 1623 03/19/15 0238 03/20/15 0502 03/21/15 0532  NA 136 135 134* 137  K 4.1 3.9 4.2 4.3  CL 106 106 106 110  CO2 20* 17* 21* 20*  GLUCOSE 92 159* 89 109*  BUN 23* 21* 17 14  CREATININE 1.77* 1.90* 1.48* 1.23  CALCIUM 9.7 9.3 8.7* 8.9  MG  --   --  2.0  --    Liver Function Tests:  Recent Labs Lab 03/19/15 0238  AST 38  ALT 8*  ALKPHOS 96  BILITOT 1.0  PROT 8.0  ALBUMIN 3.5   No results for input(s): LIPASE, AMYLASE in the last 168 hours.  Recent Labs Lab 03/21/15 0532 03/21/15 2030  AMMONIA 60* 32   CBC:  Recent Labs Lab 03/18/15 1623 03/19/15 0238 03/20/15 0502 03/21/15 0532  WBC 8.4 13.7* 11.5* 12.5*  HGB 13.8 13.8 12.3* 12.6*  HCT 40.1 39.8 36.4* 36.7*  MCV 88.1 87.7 88.3 90.0  PLT 322 317 280 279   Cardiac Enzymes: No results for input(s): CKTOTAL, CKMB, CKMBINDEX, TROPONINI in the last 168 hours. BNP: Invalid input(s): POCBNP CBG:  Recent Labs Lab 03/21/15 0757 03/21/15 1217 03/21/15 1707 03/21/15 2258 03/22/15 0734  GLUCAP 83 93 107* 98 95    Time coordinating discharge:  Greater than 30 minutes  Signed:  Firman Petrow, DO [facility] Pager: [phone] 03/22/2015, 8:08 AM

## 2015-03-22 NOTE — Clinical Social Work Note (Signed)
Patient to be d/c'ed today to [facility].  Patient and family agreeable to plans will transport via ems RN to call report.  Patient's brother has been informed that patient will be scheduled for transport today.  Evette Cristal, MSW, [place]

## 2015-03-22 NOTE — Clinical Social Work Note (Signed)
Clinical Social Work Assessment  Patient Details  Name: Kent Mayo MRN: [id number] Date of Birth: 05/26/1927  Date of referral:  03/21/15               Reason for consult:  Facility Placement                Permission sought to share information with:  Family Supports Permission granted to share information::  Yes, Verbal Permission Granted  Name::     Cobi Delph patient's brother  Agency::  SNF Admissions  Relationship::     Contact Information:     Housing/Transportation Living arrangements for the past 2 months:  [place] of Information:  Patient, Other (Comment Required) (His brother) Patient Interpreter Needed:  None Criminal Activity/Legal Involvement Pertinent to Current Situation/Hospitalization:    Significant Relationships:    Lives with:  Self Do you feel safe going back to the place where you live?  Yes (Patient is in agrement to going to SNF for short term rehab.) Need for family participation in patient care:  Yes (Comment) (Patient has some confusion and is hard of hearing.)  Care giving concerns:  Patient's brother is in agreement that he will need short term rehab at SNF in order to return back home.  Patient's brother will discuss possible long term care placement with facility once he arrives.   Social Worker assessment / plan:  Patient is a 81 year old male who lives alone and is hard of hearing.  Patient is alert and oriented x3, and able to express his thoughts however patient was not very talkative.  Patient's brother was the main contact and was at bedside while CSW was completing assessment.  Patient's brother stated patient has been in rehab before and was at [facility], and would like him to return.  Patient's brother stated they may consider staying at SNF for long term care depending on how patient does at SNF.  Patient was in agreement to decisions being made.  CSW explained to patient and his brother how Medicare  pays for rehab, and they are looking forward to him progressing.  Patient and his brother did not have any other questions at this time.  Employment status:  Retired Forensic scientist:  Medicare PT Recommendations:    Information / Referral to community resources:  [place]  Patient/Family's Response to care: Patient and family in agreement to going to SNF for short term rehab.  Patient/Family's Understanding of and Emotional Response to Diagnosis, Current Treatment, and Prognosis:  Patient and his brother are aware of patient's progress and current treatment plan.  Emotional Assessment Appearance:  Appears stated age Attitude/Demeanor/Rapport:    Affect (typically observed):  Pleasant, Quiet, Stable Orientation:  Oriented to Self, Oriented to Place, Oriented to Situation Alcohol / Substance use:  Not Applicable Psych involvement (Current and /or in the community):  No (Comment)  Discharge Needs  Concerns to be addressed:  No discharge needs identified Readmission within the last 30 days:  No Current discharge risk:  None Barriers to Discharge:  No Barriers Identified   Ross Ludwig, LCSWA 03/22/2015, 4:05 PM

## 2015-03-22 NOTE — Care Management Note (Signed)
Case Management Note  Patient Details  Name: Kent Mayo MRN: [id number] Date of Birth: 1926/12/02  Subjective/Objective:    Pt admitted with acute encephalopathy and AKI.  PTA, pt lived home alone.                  Action/Plan: PT recommending SNF.  Pt for dc to SNF today, per CSW arrangements.    Expected Discharge Date:    03/22/2015              Expected Discharge Plan:  Skilled Nursing Facility  In-House Referral:  Clinical Social Work  Discharge planning Services  CM Consult  Post Acute Care Choice:    Choice offered to:     DME Arranged:    DME Agency:     HH Arranged:    [place] Agency:     Status of Service:  Completed, signed off  Medicare Important Message Given:  Yes-second notification given Date Medicare IM Given:    Medicare IM give by:    Date Additional Medicare IM Given:    Additional Medicare Important Message give by:     If discussed at [place] of Stay Meetings, dates discussed:    Additional Comments:  Reinaldo Raddle, RN, BSN  Trauma/Neuro ICU Case Manager [phone]

## 2015-03-22 NOTE — Progress Notes (Signed)
Increased agitation, assisted to bathroom, flatus, small bm, back to bed but still agitated wanting to leave. Won't stay in chair, given .5mg  of iv ativan at 1535. Ready for dc per PTAR.

## 2015-03-22 NOTE — Clinical Social Work Placement (Signed)
   CLINICAL SOCIAL WORK PLACEMENT  NOTE  Date:  03/22/2015  Patient Details  Name: Kent Mayo MRN: [id number] Date of Birth: July 27, 1927  Clinical Social Work is seeking post-discharge placement for this patient at the [place] level of care (*CSW will initial, date and re-position this form in  chart as items are completed):  Yes   Patient/family provided with [facility] Work Department's list of facilities offering this level of care within the geographic area requested by the patient (or if unable, by the patient's family).  Yes   Patient/family informed of their freedom to choose among providers that offer the needed level of care, that participate in Medicare, Medicaid or managed care program needed by the patient, have an available bed and are willing to accept the patient.  Yes   Patient/family informed of [facility]'s ownership interest in [facility] and [facility], as well as of the fact that they are under no obligation to receive care at these facilities.  PASRR submitted to EDS on 03/21/15     PASRR number received on 03/21/15     Existing PASRR number confirmed on       FL2 transmitted to all facilities in geographic area requested by pt/family on 03/21/15     FL2 transmitted to all facilities within larger geographic area on       Patient informed that his/her managed care company has contracts with or will negotiate with certain facilities, including the following:        Yes   Patient/family informed of bed offers received.  Patient chooses bed at [facility]     Physician recommends and patient chooses bed at      Patient to be transferred to [facility] on 03/22/15.  Patient to be transferred to facility by [facility]     Patient family notified on 03/22/15 of transfer.  Name of family member notified:  Lynnell Grain     PHYSICIAN       Additional Comment:     _______________________________________________ Ross Ludwig, [place] 03/22/2015, 4:16 PM

## 2015-03-23 LAB — HEPATITIS C ANTIBODY: HCV Ab: <0.1 s/co ratio (ref 0.0–0.9)

## 2015-03-23 LAB — HEPATITIS B SURFACE ANTIGEN: HEP B S AG: NEGATIVE

## 2015-03-29 ENCOUNTER — Encounter (HOSPITAL_COMMUNITY): Payer: Self-pay | Admitting: *Deleted

## 2015-03-29 ENCOUNTER — Inpatient Hospital Stay (HOSPITAL_COMMUNITY)
Admission: EM | Admit: 2015-03-29 | Discharge: 2015-04-05 | DRG: 698 | Disposition: A | Discharge status: Skilled Nursing Facility | Payer: Medicare Other | Attending: Internal Medicine | Admitting: Internal Medicine

## 2015-03-29 DIAGNOSIS — N189 Chronic kidney disease, unspecified: Secondary | ICD-10-CM | POA: Diagnosis present

## 2015-03-29 DIAGNOSIS — H409 Unspecified glaucoma: Secondary | ICD-10-CM | POA: Diagnosis present

## 2015-03-29 DIAGNOSIS — Z79899 Other long term (current) drug therapy: Secondary | ICD-10-CM | POA: Diagnosis not present

## 2015-03-29 DIAGNOSIS — N390 Urinary tract infection, site not specified: Secondary | ICD-10-CM

## 2015-03-29 DIAGNOSIS — E441 Mild protein-calorie malnutrition: Secondary | ICD-10-CM | POA: Diagnosis present

## 2015-03-29 DIAGNOSIS — H9193 Unspecified hearing loss, bilateral: Secondary | ICD-10-CM | POA: Diagnosis present

## 2015-03-29 DIAGNOSIS — Z87891 Personal history of nicotine dependence: Secondary | ICD-10-CM

## 2015-03-29 DIAGNOSIS — R627 Adult failure to thrive: Secondary | ICD-10-CM | POA: Diagnosis present

## 2015-03-29 DIAGNOSIS — H540 Blindness, both eyes: Secondary | ICD-10-CM | POA: Diagnosis present

## 2015-03-29 DIAGNOSIS — R269 Unspecified abnormalities of gait and mobility: Secondary | ICD-10-CM | POA: Diagnosis present

## 2015-03-29 DIAGNOSIS — Z8249 Family history of ischemic heart disease and other diseases of the circulatory system: Secondary | ICD-10-CM

## 2015-03-29 DIAGNOSIS — R339 Retention of urine, unspecified: Secondary | ICD-10-CM | POA: Diagnosis not present

## 2015-03-29 DIAGNOSIS — R310 Gross hematuria: Secondary | ICD-10-CM | POA: Diagnosis present

## 2015-03-29 DIAGNOSIS — Z7982 Long term (current) use of aspirin: Secondary | ICD-10-CM

## 2015-03-29 DIAGNOSIS — N179 Acute kidney failure, unspecified: Secondary | ICD-10-CM

## 2015-03-29 DIAGNOSIS — R338 Other retention of urine: Secondary | ICD-10-CM | POA: Diagnosis present

## 2015-03-29 DIAGNOSIS — N320 Bladder-neck obstruction: Secondary | ICD-10-CM | POA: Diagnosis present

## 2015-03-29 DIAGNOSIS — E1122 Type 2 diabetes mellitus with diabetic chronic kidney disease: Secondary | ICD-10-CM | POA: Diagnosis present

## 2015-03-29 DIAGNOSIS — N401 Enlarged prostate with lower urinary tract symptoms: Secondary | ICD-10-CM | POA: Diagnosis present

## 2015-03-29 DIAGNOSIS — G9340 Encephalopathy, unspecified: Secondary | ICD-10-CM | POA: Diagnosis present

## 2015-03-29 DIAGNOSIS — Z833 Family history of diabetes mellitus: Secondary | ICD-10-CM

## 2015-03-29 DIAGNOSIS — R600 Localized edema: Secondary | ICD-10-CM | POA: Diagnosis present

## 2015-03-29 DIAGNOSIS — Z6830 Body mass index (BMI) 30.0-30.9, adult: Secondary | ICD-10-CM

## 2015-03-29 DIAGNOSIS — N138 Other obstructive and reflux uropathy: Secondary | ICD-10-CM | POA: Diagnosis present

## 2015-03-29 DIAGNOSIS — I10 Essential (primary) hypertension: Secondary | ICD-10-CM | POA: Diagnosis not present

## 2015-03-29 DIAGNOSIS — T8351XA Infection and inflammatory reaction due to indwelling urinary catheter, initial encounter: Secondary | ICD-10-CM | POA: Diagnosis present

## 2015-03-29 DIAGNOSIS — R197 Diarrhea, unspecified: Secondary | ICD-10-CM | POA: Diagnosis not present

## 2015-03-29 DIAGNOSIS — E872 Acidosis: Secondary | ICD-10-CM | POA: Diagnosis present

## 2015-03-29 DIAGNOSIS — Y846 Urinary catheterization as the cause of abnormal reaction of the patient, or of later complication, without mention of misadventure at the time of the procedure: Secondary | ICD-10-CM | POA: Diagnosis present

## 2015-03-29 DIAGNOSIS — I129 Hypertensive chronic kidney disease with stage 1 through stage 4 chronic kidney disease, or unspecified chronic kidney disease: Secondary | ICD-10-CM | POA: Diagnosis present

## 2015-03-29 DIAGNOSIS — E119 Type 2 diabetes mellitus without complications: Secondary | ICD-10-CM

## 2015-03-29 DIAGNOSIS — E875 Hyperkalemia: Secondary | ICD-10-CM | POA: Diagnosis not present

## 2015-03-29 DIAGNOSIS — E785 Hyperlipidemia, unspecified: Secondary | ICD-10-CM | POA: Diagnosis present

## 2015-03-29 DIAGNOSIS — R319 Hematuria, unspecified: Secondary | ICD-10-CM | POA: Diagnosis not present

## 2015-03-29 LAB — CK: Total CK: 107 U/L (ref 49–397)

## 2015-03-29 LAB — COMPREHENSIVE METABOLIC PANEL
ALBUMIN: 3.1 g/dL — AB (ref 3.5–5.0)
ALK PHOS: 102 U/L (ref 38–126)
ALT: 14 U/L — ABNORMAL LOW (ref 17–63)
AST: 36 U/L (ref 15–41)
Anion gap: 16 — ABNORMAL HIGH (ref 5–15)
BILIRUBIN TOTAL: 0.3 mg/dL (ref 0.3–1.2)
BUN: 118 mg/dL — AB (ref 6–20)
CO2: 16 mmol/L — AB (ref 22–32)
Calcium: 9.1 mg/dL (ref 8.9–10.3)
Chloride: 102 mmol/L (ref 101–111)
Creatinine, Ser: 8.13 mg/dL — ABNORMAL HIGH (ref 0.61–1.24)
GFR calc Af Amer: 6 mL/min — ABNORMAL LOW (ref >60)
GFR calc non Af Amer: 5 mL/min — ABNORMAL LOW (ref >60)
GLUCOSE: 229 mg/dL — AB (ref 65–99)
POTASSIUM: 5.7 mmol/L — AB (ref 3.5–5.1)
SODIUM: 134 mmol/L — AB (ref 135–145)
TOTAL PROTEIN: 7.3 g/dL (ref 6.5–8.1)

## 2015-03-29 LAB — URINE MICROSCOPIC-ADD ON

## 2015-03-29 LAB — URINALYSIS, ROUTINE W REFLEX MICROSCOPIC
GLUCOSE, UA: NEGATIVE mg/dL
Ketones, ur: NEGATIVE mg/dL
Nitrite: NEGATIVE
PH: 5 (ref 5.0–8.0)
Protein, ur: 100 mg/dL — AB
SPECIFIC GRAVITY, URINE: 1.019 (ref 1.005–1.030)
UROBILINOGEN UA: 0.2 mg/dL (ref 0.0–1.0)

## 2015-03-29 LAB — BASIC METABOLIC PANEL
Anion gap: 15 (ref 5–15)
BUN: 105 mg/dL — AB (ref 6–20)
CALCIUM: 8.8 mg/dL — AB (ref 8.9–10.3)
CO2: 15 mmol/L — AB (ref 22–32)
CREATININE: 7.06 mg/dL — AB (ref 0.61–1.24)
Chloride: 104 mmol/L (ref 101–111)
GFR calc Af Amer: 7 mL/min — ABNORMAL LOW (ref >60)
GFR calc non Af Amer: 6 mL/min — ABNORMAL LOW (ref >60)
GLUCOSE: 189 mg/dL — AB (ref 65–99)
Potassium: 5.4 mmol/L — ABNORMAL HIGH (ref 3.5–5.1)
Sodium: 134 mmol/L — ABNORMAL LOW (ref 135–145)

## 2015-03-29 LAB — CBC WITH DIFFERENTIAL/PLATELET
BASOS PCT: 0 % (ref 0–1)
Basophils Absolute: 0 10*3/uL (ref 0.0–0.1)
Eosinophils Absolute: 0.1 10*3/uL (ref 0.0–0.7)
Eosinophils Relative: 0 % (ref 0–5)
HEMATOCRIT: 36.4 % — AB (ref 39.0–52.0)
HEMOGLOBIN: 12.7 g/dL — AB (ref 13.0–17.0)
LYMPHS ABS: 1.6 10*3/uL (ref 0.7–4.0)
Lymphocytes Relative: 8 % — ABNORMAL LOW (ref 12–46)
MCH: 30.2 pg (ref 26.0–34.0)
MCHC: 34.9 g/dL (ref 30.0–36.0)
MCV: 86.7 fL (ref 78.0–100.0)
MONO ABS: 1.7 10*3/uL — AB (ref 0.1–1.0)
MONOS PCT: 9 % (ref 3–12)
NEUTROS ABS: 15.5 10*3/uL — AB (ref 1.7–7.7)
NEUTROS PCT: 83 % — AB (ref 43–77)
Platelets: 257 10*3/uL (ref 150–400)
RBC: 4.2 MIL/uL — ABNORMAL LOW (ref 4.22–5.81)
RDW: 13.8 % (ref 11.5–15.5)
WBC: 18.8 10*3/uL — ABNORMAL HIGH (ref 4.0–10.5)

## 2015-03-29 LAB — AMMONIA: AMMONIA: 32 umol/L (ref 9–35)

## 2015-03-29 LAB — I-STAT CG4 LACTIC ACID, ED: Lactic Acid, Venous: 1.98 mmol/L (ref 0.5–2.0)

## 2015-03-29 LAB — GLUCOSE, CAPILLARY: Glucose-Capillary: 176 mg/dL — ABNORMAL HIGH (ref 65–99)

## 2015-03-29 MED ORDER — SODIUM CHLORIDE 0.9 % IV BOLUS (SEPSIS)
1000.0000 mL | Freq: Once | INTRAVENOUS | Status: AC
  Administered 2015-03-29: 1000 mL via INTRAVENOUS

## 2015-03-29 MED ORDER — ACETAMINOPHEN 325 MG PO TABS
650.0000 mg | ORAL_TABLET | Freq: Four times a day (QID) | ORAL | Status: DC | PRN
  Administered 2015-04-02 – 2015-04-03 (×3): 650 mg via ORAL
  Filled 2015-03-29 (×3): qty 2

## 2015-03-29 MED ORDER — SODIUM CHLORIDE 0.9 % IV SOLN: INTRAVENOUS | Status: DC

## 2015-03-29 MED ORDER — ACETAMINOPHEN 650 MG RE SUPP: 650.0000 mg | Freq: Four times a day (QID) | RECTAL | Status: DC | PRN

## 2015-03-29 MED ORDER — ONDANSETRON HCL 4 MG/2ML IJ SOLN
4.0000 mg | Freq: Four times a day (QID) | INTRAMUSCULAR | Status: DC | PRN
  Filled 2015-03-29: qty 2

## 2015-03-29 MED ORDER — BELLADONNA ALKALOIDS-OPIUM 16.2-60 MG RE SUPP: 1.0000 | Freq: Four times a day (QID) | RECTAL | Status: DC | PRN

## 2015-03-29 MED ORDER — DEXTROSE 5 % IV SOLN
1.0000 g | INTRAVENOUS | Status: DC
  Administered 2015-03-30: 1 g via INTRAVENOUS
  Filled 2015-03-29: qty 10

## 2015-03-29 MED ORDER — DEXTROSE 5 % IV SOLN: 1.0000 g | INTRAVENOUS | Status: DC

## 2015-03-29 MED ORDER — DEXTROSE 5 % IV SOLN
1.0000 g | Freq: Once | INTRAVENOUS | Status: AC
  Administered 2015-03-29: 1 g via INTRAVENOUS
  Filled 2015-03-29: qty 10

## 2015-03-29 MED ORDER — METOPROLOL TARTRATE 1 MG/ML IV SOLN: 5.0000 mg | INTRAVENOUS | Status: DC | PRN

## 2015-03-29 MED ORDER — ONDANSETRON HCL 4 MG PO TABS: 4.0000 mg | ORAL_TABLET | Freq: Four times a day (QID) | ORAL | Status: DC | PRN

## 2015-03-29 MED ORDER — STERILE WATER FOR INJECTION IV SOLN
INTRAVENOUS | Status: DC
  Administered 2015-03-29 – 2015-03-30 (×3): via INTRAVENOUS
  Filled 2015-03-29 (×6): qty 850

## 2015-03-29 MED ORDER — INSULIN ASPART 100 UNIT/ML ~~LOC~~ SOLN
0.0000 [IU] | Freq: Three times a day (TID) | SUBCUTANEOUS | Status: DC
  Administered 2015-03-30 – 2015-04-01 (×5): 1 [IU] via SUBCUTANEOUS
  Administered 2015-04-01: 2 [IU] via SUBCUTANEOUS
  Administered 2015-04-02: 1 [IU] via SUBCUTANEOUS
  Administered 2015-04-03: 2 [IU] via SUBCUTANEOUS
  Administered 2015-04-04 (×2): 1 [IU] via SUBCUTANEOUS

## 2015-03-29 NOTE — ED Notes (Signed)
Bed: [facility] Expected date:  Expected time:  Means of arrival:  Comments: EMS-AMS

## 2015-03-29 NOTE — H&P (Addendum)
[facility] History and Physical  Kent Mayo STM:[id number] DOB: 1927-07-21 DOA: 03/29/2015   PCP: No primary care provider on file.    Chief Complaint: sent for altered mental status from SNF  HPI: Kent Mayo is a 81 y.o. male with diabetes mellitus, hypertension, dyslipidemia and glaucoma who was sent from the skilled nursing facility for altered mental status. The patient was here earlier this month and was discharged on 8/19th. He was treated for confusion, ARF due to urinary retention which resolved with a Foley catheter. It was recommended that the catheter remain in place and he would follow-up with urology in 2 weeks. At that time he had hematuria and bladder spasms. B and O suppositories were given to help with this. He returns again for altered mental status. His creatinine is 8.13.   ROS - cannot obtain due to confusion  Past Medical History  Diagnosis Date  . Diabetes mellitus without complication   . Hypertension   . Hearing difficulty of both ears     Bilateral hearing aids  . Blindness     Severe visual impairment OD, Blind OS  . Abnormality of gait   . Peripheral edema   . Glaucoma   . Dyslipidemia     Past Surgical History  Procedure Laterality Date  . Eye surgery      Social History: currently at nursing facility     No Known Allergies  Family history:   Family History  Problem Relation Age of Onset  . Hypertension Mother   . Hypertension Father   . Diabetes Mellitus II Brother       Prior to Admission medications   Medication Sig Start Date End Date Taking? Authorizing Provider  aspirin 81 MG tablet Take 81 mg by mouth daily.   Yes Historical Provider, MD  atorvastatin (LIPITOR) 10 MG tablet Take 10 mg by mouth at bedtime.   Yes Historical Provider, MD  brimonidine (ALPHAGAN) 0.2 % ophthalmic solution Place 1 drop into both eyes 3 (three) times daily.   Yes Historical Provider, MD  carvedilol (COREG) 6.25 MG tablet Take  6.25 mg by mouth 2 (two) times daily. With food. 08/23/14  Yes Historical Provider, MD  cefTRIAXone 1 g in dextrose 5 % 50 mL Inject 1 g into the vein daily.   Yes Historical Provider, MD  dorzolamide-timolol (COSOPT) 22.3-6.8 MG/ML ophthalmic solution Place 1 drop into the right eye 2 (two) times daily.   Yes Historical Provider, MD  feeding supplement, ENSURE ENLIVE, (ENSURE ENLIVE) LIQD Take 237 mLs by mouth 2 (two) times daily between meals. 03/22/15  Yes [name], MD  finasteride (PROSCAR) 5 MG tablet Take 5 mg by mouth daily. 10/11/14  Yes Historical Provider, MD  mirabegron ER (MYRBETRIQ) 25 MG TB24 tablet Take 1 tablet (25 mg total) by mouth daily. Stop after last dose on 03/29/15 03/22/15  Yes David Tat, MD  verapamil (CALAN-SR) 240 MG CR tablet Take 240 mg by mouth at bedtime.   Yes Historical Provider, MD  opium-belladonna (B&O SUPPRETTES) 16.2-60 MG suppository Place 1 suppository rectally every 6 (six) hours as needed for bladder spasms. 03/22/15   [name], MD  senna-docusate (SENOKOT-S) 8.6-50 MG per tablet Take 1 tablet by mouth at bedtime as needed for mild constipation. 03/22/15   [name], MD     Physical Exam: Filed Vitals:   03/29/15 1538 03/29/15 1615 03/29/15 1653 03/29/15 1658  BP: 121/77 120/78    Pulse: 84 82    Temp:  97.6 F (36.4 C)     TempSrc: Oral     Resp: 18 26    SpO2: 97% 94% 89% 98%     General: alert, unable to follow commands, mumbling nonsensical speech, no acute distress HEENT: Normocephalic and Atraumatic, Mucous membranes pink                Cataract left eye;  No scleral icterus,                 Nares: Patent, Oropharynx: Clear, Fair Dentition                 Neck: FROM, no cervical lymphadenopathy, thyromegaly, carotid bruit or JVD;  Breasts: deferred CHEST WALL: No tenderness  CHEST: Normal respiration, clear to auscultation bilaterally  HEART: Regular rate and rhythm;  BACK: No kyphosis or scoliosis; no CVA tenderness  GI: Positive Bowel  Sounds, soft, non-tender; mass in suprapubic area, no organomegaly Rectal Exam: deferred MSK: No cyanosis, clubbing, 2+ edema of feet and legs Genitalia: not examined  SKIN:  no rash or ulceration  CNS: Alert and Oriented x 4, Nonfocal exam, CN 2-12 intact  Labs on Admission:  Basic Metabolic Panel:  Recent Labs Lab 03/29/15 1611  NA 134*  K 5.7*  CL 102  CO2 16*  GLUCOSE 229*  BUN 118*  CREATININE 8.13*  CALCIUM 9.1   Liver Function Tests:  Recent Labs Lab 03/29/15 1611  AST 36  ALT 14*  ALKPHOS 102  BILITOT 0.3  PROT 7.3  ALBUMIN 3.1*   No results for input(s): LIPASE, AMYLASE in the last 168 hours.  Recent Labs Lab 03/29/15 1612  AMMONIA 32   CBC:  Recent Labs Lab 03/29/15 1611  WBC 18.8*  NEUTROABS 15.5*  HGB 12.7*  HCT 36.4*  MCV 86.7  PLT 257   Cardiac Enzymes:  Recent Labs Lab 03/29/15 1611  CKTOTAL 107    BNP (last 3 results) No results for input(s): BNP in the last 8760 hours.  ProBNP (last 3 results) No results for input(s): PROBNP in the last 8760 hours.  CBG: No results for input(s): GLUCAP in the last 168 hours.  Radiological Exams on Admission: No results found.  EKG: Independently reviewed. SR at 83 bpm  Assessment/Plan Principal Problem:   AKI (acute kidney injury)/   Urinary retention - I have examined him. He has a very small amount of bloody urine in his Foley bag and has clots in the bag. I've asked the ER doctor to do an ultrasound which has determined that his bladder is extensively distended. The RN has attempted to flush the foley- unable to flush- changing out foley now -We will also start IV fluids-suspect he will have a postobstructive diuresis -Monitor I and O closely  Active Problems:   Acute encephalopathy - Secondary to above- NPO for now  UTI -Blood cultures and urine culture-start Rocephin  Hyperkalemia - due to ARF - hydrate, resolve obstruction -  repeat at midnight    HTN  (hypertension) -BP currently normal-we'll place on when necessary Lopressor for hypertension    Diabetes mellitus -Will place on low-dose sliding scale every 6 hours until more alert and able to eat   Consulted:   Code Status: full code Family Communication:   DVT Prophylaxis:SCDs  Time spent: 106 min  [place], MD [facility]  If 7PM-7AM, please contact night-coverage [email or web address] 03/29/2015, 5:40 PM

## 2015-03-29 NOTE — Consult Note (Addendum)
Reason for Consult:AKI Referring Physician: Wynelle [place], MD  Kent Mayo is an 81 y.o. male.  HPI: Pt is an 81 yo M with PMH sig for DM, HTN, and bladder outlet obstruction who was recently admitted for AKI due to urinary obstruction s/p foley catheter placement and was discharged to a SNF who developed AMS over the last 3 days and was brought to [facility] for further evaluation.  He had been admitted to [facility] on 8/15-8/19/16 for failure to thrive and also was diagnosed with AKI with Scr peaking at 1.9.  This was felt to be due to volume depletion as well as NSAID use and urinary retention.  He had a foley catheter placed during that hospalization and his Scr iimproved to 1.15 at time of discharge to a SNF.  During this visit to the ED he was found to have a SCr of 8.13 up from his most recent 1.15 1 week prior.  There were clots noted in the foley and exam revealed a distended bladder which was confirmed by bladder scan in the ED and Urology was asked to help replace his foley catheter which released 700 cc of urine.  We were asked to see the patient due to his AKI/CKD as well as hyperkalemia and metabolic acidosis.  Please see the trend in Scr below.  Kent Mayo is lethargic and barely arousable but was able to give his birthday to the nurse.  Trend in Creatinine: CREATININE, SER  Date/Time Value Ref Range Status  03/29/2015 04:11 PM 8.13* 0.61 - 1.24 mg/dL Final  03/22/2015 07:11 AM 1.15 0.61 - 1.24 mg/dL Final  03/21/2015 05:32 AM 1.23 0.61 - 1.24 mg/dL Final  03/20/2015 05:02 AM 1.48* 0.61 - 1.24 mg/dL Final  03/19/2015 02:38 AM 1.90* 0.61 - 1.24 mg/dL Final  03/18/2015 04:23 PM 1.77* 0.61 - 1.24 mg/dL Final  10/25/2014 03:58 PM 1.02 0.50 - 1.35 mg/dL Final  02/23/2013 01:38 AM 0.88 0.50 - 1.35 mg/dL Final  02/22/2013 05:13 PM 1.05 0.50 - 1.35 mg/dL Final    PMH:   Past Medical History  Diagnosis Date  . Diabetes mellitus without complication   . Hypertension   . Hearing difficulty of  both ears     Bilateral hearing aids  . Blindness     Severe visual impairment OD, Blind OS  . Abnormality of gait   . Peripheral edema   . Glaucoma   . Dyslipidemia     PSH:   Past Surgical History  Procedure Laterality Date  . Eye surgery      Allergies: No Known Allergies  Medications:   Prior to Admission medications   Medication Sig Start Date End Date Taking? Authorizing Provider  aspirin 81 MG tablet Take 81 mg by mouth daily.   Yes Historical Provider, MD  atorvastatin (LIPITOR) 10 MG tablet Take 10 mg by mouth at bedtime.   Yes Historical Provider, MD  brimonidine (ALPHAGAN) 0.2 % ophthalmic solution Place 1 drop into both eyes 3 (three) times daily.   Yes Historical Provider, MD  carvedilol (COREG) 6.25 MG tablet Take 6.25 mg by mouth 2 (two) times daily. With food. 08/23/14  Yes Historical Provider, MD  cefTRIAXone 1 g in dextrose 5 % 50 mL Inject 1 g into the vein daily.   Yes Historical Provider, MD  dorzolamide-timolol (COSOPT) 22.3-6.8 MG/ML ophthalmic solution Place 1 drop into the right eye 2 (two) times daily.   Yes Historical Provider, MD  feeding supplement, ENSURE ENLIVE, (ENSURE ENLIVE) LIQD Take 237  mLs by mouth 2 (two) times daily between meals. 03/22/15  Yes [name], MD  finasteride (PROSCAR) 5 MG tablet Take 5 mg by mouth daily. 10/11/14  Yes Historical Provider, MD  mirabegron ER (MYRBETRIQ) 25 MG TB24 tablet Take 1 tablet (25 mg total) by mouth daily. Stop after last dose on 03/29/15 03/22/15  Yes David Tat, MD  verapamil (CALAN-SR) 240 MG CR tablet Take 240 mg by mouth at bedtime.   Yes Historical Provider, MD  opium-belladonna (B&O SUPPRETTES) 16.2-60 MG suppository Place 1 suppository rectally every 6 (six) hours as needed for bladder spasms. 03/22/15   [name], MD  senna-docusate (SENOKOT-S) 8.6-50 MG per tablet Take 1 tablet by mouth at bedtime as needed for mild constipation. 03/22/15   [name], MD    Inpatient medications: . [START ON 03/30/2015]  cefTRIAXone (ROCEPHIN)  IV  1 g Intravenous Q24H  . [START ON 03/30/2015] insulin aspart  0-9 Units Subcutaneous TID WC    Discontinued Meds:   Medications Discontinued During This Encounter  Medication Reason  . ALPHAGAN P 0.1 % SOLN Dose change  . linagliptin (TRADJENTA) 5 MG TABS tablet Completed Course  . rosuvastatin (CRESTOR) 10 MG tablet Completed Course  . cefTRIAXone (ROCEPHIN) 1 G injection Duplicate  . cefTRIAXone (ROCEPHIN) 20 MG/ML IVPB Duplicate  . cefTRIAXone (ROCEPHIN) 1 g in dextrose 5 % 50 mL IVPB     Social History:  reports that he has quit smoking. He does not have any smokeless tobacco history on file. He reports that he does not drink alcohol or use illicit drugs.  Family History:   Family History  Problem Relation Age of Onset  . Hypertension Mother   . Hypertension Father   . Diabetes Mellitus II Brother     Review of systems not obtained due to patient factors. Weight change:   Intake/Output Summary (Last 24 hours) at 03/29/15 2110 Last data filed at 03/29/15 1947  Gross per 24 hour  Intake      0 ml  Output    700 ml  Net   -700 ml   BP 125/81 mmHg  Pulse 85  Temp(Src) 97.3 F (36.3 C) (Axillary)  Resp 30  SpO2 100% Filed Vitals:   03/29/15 1653 03/29/15 1658 03/29/15 1855 03/29/15 2012  BP:   135/89 125/81  Pulse:   84 85  Temp:   97.6 F (36.4 C) 97.3 F (36.3 C)  TempSrc:   Axillary Axillary  Resp:   29 30  SpO2: 89% 98% 100% 100%     General appearance: cachectic and lethargic and minimally responsive Head: Normocephalic, without obvious abnormality, atraumatic Resp: transmitted upper airway sounds otherwise CTA Cardio: regular rate and rhythm, no rub and faint heart sounds GI: soft, non-tender; bowel sounds normal; no masses,  no organomegaly Extremities: edema trace pretibial edema  Labs: Basic Metabolic Panel:  Recent Labs Lab 03/29/15 1611  NA 134*  K 5.7*  CL 102  CO2 16*  GLUCOSE 229*  BUN 118*  CREATININE  8.13*  ALBUMIN 3.1*  CALCIUM 9.1   Liver Function Tests:  Recent Labs Lab 03/29/15 1611  AST 36  ALT 14*  ALKPHOS 102  BILITOT 0.3  PROT 7.3  ALBUMIN 3.1*   No results for input(s): LIPASE, AMYLASE in the last 168 hours.  Recent Labs Lab 03/29/15 1612  AMMONIA 32   CBC:  Recent Labs Lab 03/29/15 1611  WBC 18.8*  NEUTROABS 15.5*  HGB 12.7*  HCT 36.4*  MCV 86.7  PLT 257   PT/INR: @LABRCNTIP (inr:5) Cardiac Enzymes: ) Recent Labs Lab 03/29/15 1611  CKTOTAL 107   CBG:  Recent Labs Lab 03/29/15 2036  GLUCAP 176*    Iron Studies: No results for input(s): IRON, TIBC, TRANSFERRIN, FERRITIN in the last 168 hours.  Xrays/Other Studies: No results found.   Assessment/Plan: 1.  AKI/CKD- in setting of bladder outlet obstruction.  Agree with current therapy mainly replacing foley, might consider 3 way foley to allow for flushing if clotting becomes an issue again. 1. Cont with IVF's but will change to isotonic bicarb given met acidosis and hyperkalemia 2. Hyperkalemia - due to #1 1. Isotonic bicarb and follow UOP and electrolytes 3. Metabolic acidosis due to #1 1. As above isotonic bicarb and follow 4. Bladder outlet obstruction from a clogged foley- urology consulted to help replace foley catheter 1. May need 3 way to flush if continues to clot 5. DM- per primary svc 6. HTN- stable 7. FTT- need to address code status with next of kin or medical power of attorney as he is a full code.   [place] A 03/29/2015, 9:10 PM

## 2015-03-29 NOTE — ED Notes (Signed)
Pt sent from [facility] due to abnormal lab and increased lethargy for the past 3-4 days. Pt's creatinine was 5.21, drawn on 8/23.

## 2015-03-29 NOTE — ED Notes (Signed)
Nurse will get blood cultures. 

## 2015-03-29 NOTE — ED Notes (Signed)
Catheter clamped at 1718. Urologists intrusted nursing staff to drain bladder 1 liter at a time, waiting 1 hour between each liter. 5E informed of urologist instruction.

## 2015-03-29 NOTE — Progress Notes (Signed)
Foley unclamped at 2020. About 350 emptied instantly. Will continue to monitor for the next hour and re-clamp if needed.

## 2015-03-29 NOTE — ED Provider Notes (Signed)
Discussed care with the urologist who will come to see the patient to assist with Foley catheter placement. On nurse's attempt there was blood, on my attempt no return of anything.  Initially the Foley was unable to be flushed hence it was attempted to be replaced.  Kent Chapel, MD 03/29/15 [phone]

## 2015-03-29 NOTE — Consult Note (Signed)
Urology Consult  Referring physician: Noemi Chapel Reason for referral: Urinary retention, inability to place foley  Chief Complaint: suprapubic pain  History of Present Illness: Kent Mayo is a 81yo with a hx of DMII, HTN, and BPH who presented to the ER with confusion, decreased urine output. He was found to have a poorly draining urinary catheter. The catheter was removed and multiple attempts were made to replace the catheter without success. During his previous hospitalization Dr Roni Bread placed a foley over a wire. His creatinine on presentation is 8.13, WBC count is 18.8.   Past Medical History  Diagnosis Date  . Diabetes mellitus without complication   . Hypertension   . Hearing difficulty of both ears     Bilateral hearing aids  . Blindness     Severe visual impairment OD, Blind OS  . Abnormality of gait   . Peripheral edema   . Glaucoma   . Dyslipidemia    Past Surgical History  Procedure Laterality Date  . Eye surgery      Medications: I have reviewed the patient's current medications. Allergies: No Known Allergies  Family History  Problem Relation Age of Onset  . Hypertension Mother   . Hypertension Father   . Diabetes Mellitus II Brother    Social History:  reports that he has quit smoking. He does not have any smokeless tobacco history on file. He reports that he does not drink alcohol or use illicit drugs.  Review of Systems  Genitourinary: Positive for dysuria, urgency and frequency.  Musculoskeletal: Positive for back pain.  Neurological: Positive for weakness and headaches.  All other systems reviewed and are negative.   Physical Exam:  Vital signs in last 24 hours: Temp:  [97.3 F (36.3 C)-97.6 F (36.4 C)] 97.3 F (36.3 C) (08/26 2012) Pulse Rate:  [82-85] 85 (08/26 2012) Resp:  [18-30] 30 (08/26 2012) BP: (120-135)/(77-89) 125/81 mmHg (08/26 2012) SpO2:  [89 %-100 %] 100 % (08/26 2012) Physical Exam  Constitutional: He is oriented to person,  place, and time. He appears well-developed and well-nourished. He appears distressed.  HENT:  Head: Normocephalic and atraumatic.  Eyes: Left eye exhibits abnormal extraocular motion. Left pupil is not reactive.  Neck: Normal range of motion. Neck supple. No thyromegaly present.  Cardiovascular: Normal rate and regular rhythm.   Respiratory: Effort normal. No respiratory distress. He has no wheezes.  GI: Soft. There is tenderness. Hernia confirmed negative in the right inguinal area and confirmed negative in the left inguinal area.  Genitourinary: Testes normal and penis normal. Right testis shows no mass, no swelling and no tenderness. Left testis shows no mass, no swelling and no tenderness. Circumcised. No hypospadias or penile tenderness.  Musculoskeletal: Normal range of motion. He exhibits edema.  Neurological: He is alert and oriented to person, place, and time.  Skin: Skin is warm and dry.  Psychiatric: He has a normal mood and affect. His behavior is normal. Judgment and thought content normal.    Laboratory Data:  Results for orders placed or performed during the hospital encounter of 03/29/15 (from the past 72 hour(s))  Comprehensive metabolic panel     Status: Abnormal   Collection Time: 03/29/15  4:11 PM  Result Value Ref Range   Sodium 134 (L) 135 - 145 mmol/L   Potassium 5.7 (H) 3.5 - 5.1 mmol/L   Chloride 102 101 - 111 mmol/L   CO2 16 (L) 22 - 32 mmol/L   Glucose, Bld 229 (H) 65 - 99 mg/dL  BUN 118 (H) 6 - 20 mg/dL    Comment: REPEATED TO VERIFY RESULTS CONFIRMED BY MANUAL DILUTION    Creatinine, Ser 8.13 (H) 0.61 - 1.24 mg/dL   Calcium 9.1 8.9 - 10.3 mg/dL   Total Protein 7.3 6.5 - 8.1 g/dL   Albumin 3.1 (L) 3.5 - 5.0 g/dL   AST 36 15 - 41 U/L   ALT 14 (L) 17 - 63 U/L   Alkaline Phosphatase 102 38 - 126 U/L   Total Bilirubin 0.3 0.3 - 1.2 mg/dL   GFR calc non Af Amer 5 (L) >60 mL/min   GFR calc Af Amer 6 (L) >60 mL/min    Comment: (NOTE) The eGFR has been  calculated using the CKD EPI equation. This calculation has not been validated in all clinical situations. eGFR's persistently <60 mL/min signify possible Chronic Kidney Disease.    Anion gap 16 (H) 5 - 15  CK     Status: None   Collection Time: 03/29/15  4:11 PM  Result Value Ref Range   Total CK 107 49 - 397 U/L  CBC with Differential/Platelet     Status: Abnormal   Collection Time: 03/29/15  4:11 PM  Result Value Ref Range   WBC 18.8 (H) 4.0 - 10.5 K/uL   RBC 4.20 (L) 4.22 - 5.81 MIL/uL   Hemoglobin 12.7 (L) 13.0 - 17.0 g/dL   HCT 36.4 (L) 39.0 - 52.0 %   MCV 86.7 78.0 - 100.0 fL   MCH 30.2 26.0 - 34.0 pg   MCHC 34.9 30.0 - 36.0 g/dL   RDW 13.8 11.5 - 15.5 %   Platelets 257 150 - 400 K/uL   Neutrophils Relative % 83 (H) 43 - 77 %   Neutro Abs 15.5 (H) 1.7 - 7.7 K/uL   Lymphocytes Relative 8 (L) 12 - 46 %   Lymphs Abs 1.6 0.7 - 4.0 K/uL   Monocytes Relative 9 3 - 12 %   Monocytes Absolute 1.7 (H) 0.1 - 1.0 K/uL   Eosinophils Relative 0 0 - 5 %   Eosinophils Absolute 0.1 0.0 - 0.7 K/uL   Basophils Relative 0 0 - 1 %   Basophils Absolute 0.0 0.0 - 0.1 K/uL  Ammonia     Status: None   Collection Time: 03/29/15  4:12 PM  Result Value Ref Range   Ammonia 32 9 - 35 umol/L  Urinalysis, Routine w reflex microscopic (not at [facility])     Status: Abnormal   Collection Time: 03/29/15  4:13 PM  Result Value Ref Range   Color, Urine RED (A) YELLOW    Comment: BIOCHEMICALS MAY BE AFFECTED BY COLOR   APPearance TURBID (A) CLEAR   Specific Gravity, Urine 1.019 1.005 - 1.030   pH 5.0 5.0 - 8.0   Glucose, UA NEGATIVE NEGATIVE mg/dL   Hgb urine dipstick LARGE (A) NEGATIVE   Bilirubin Urine MODERATE (A) NEGATIVE   Ketones, ur NEGATIVE NEGATIVE mg/dL   Protein, ur 100 (A) NEGATIVE mg/dL   Urobilinogen, UA 0.2 0.0 - 1.0 mg/dL   Nitrite NEGATIVE NEGATIVE   Leukocytes, UA LARGE (A) NEGATIVE  Urine microscopic-add on     Status: Abnormal   Collection Time: 03/29/15  4:13 PM  Result Value  Ref Range   RBC / HPF TOO NUMEROUS TO COUNT <3 RBC/hpf   Bacteria, UA MANY (A) RARE   Urine-Other FIELD OBSCURED BY RBC'S     Comment: URINALYSIS PERFORMED ON SUPERNATANT  I-Stat CG4 Lactic Acid, ED  Status: None   Collection Time: 03/29/15  4:22 PM  Result Value Ref Range   Lactic Acid, Venous 1.98 0.5 - 2.0 mmol/L  Glucose, capillary     Status: Abnormal   Collection Time: 03/29/15  8:36 PM  Result Value Ref Range   Glucose-Capillary 176 (H) 65 - 99 mg/dL   No results found for this or any previous visit (from the past 240 hour(s)). Creatinine:  Recent Labs  03/29/15 1611  CREATININE 8.13*   Baseline Creatinine: 1  Procedures: Foley Catheter [place]--Complex  Patient was prepped and draped in the usual sterile fashion using betadine solution. 2% viscous lidocaine was inserted per urethra. A 0.038 sensor was advanced per urethra without significant resistance or recoiling. Upon passage through the bladder neck with the wire urine was noted to emanate from the urethral meatus. A 20 Fr council catheter was placed via the Blitz technique and passed easily with immediate return of >1000 cc of clear, amber colored urine without clot. 10 cc sterile water was placed in the balloon port.    Impression/Assessment:  81yo with urinary retention, acute renal failure and difficult foley placement. [place] catheter placed over a wire.  Plan:  1. Continue foley catheter to straight drain. Foley to remain in place for at least 7 days prior to trial of void given strethc injury and traumatic foley insertion. 2. Continue to trend electrolytes since pt is at risk of post obstructive diuresis.   3. Urology to continue to follow  Neetu Carrozza L 03/29/2015, 9:01 PM

## 2015-03-29 NOTE — ED Provider Notes (Signed)
CSN: [id number]     Arrival date & time 03/29/15  1521 History   First MD Initiated Contact with Patient 03/29/15 1524     Chief Complaint  Patient presents with  . Abnormal Lab  . Fatigue     (Consider location/radiation/quality/duration/timing/severity/associated sxs/prior Treatment) HPI Comments: From [employer]  The pt is an ill appearing male with hx of acute kidney injury which occurred a couple of weeks ago when he was admitted to the hospital with a Cr of 1.9 and FTT.  He was hydrated - the AKI was thought to be due to nsaid use along with some urinary retention - he had a foley placed - his metformin and nsaids were stopped during his inpatient stay and he was started on Myrbetriq for urinary symptoms.  The pt is unable to give me any information as he appears altered and is speaking in quiet one word answers falling asleep between words.  Per EMS - he has Cr of 5.2 drawn a couple of days ago.  The history is provided by the patient and medical records.    Past Medical History  Diagnosis Date  . Diabetes mellitus without complication   . Hypertension   . Hearing difficulty of both ears     Bilateral hearing aids  . Blindness     Severe visual impairment OD, Blind OS  . Abnormality of gait   . Peripheral edema   . Glaucoma   . Dyslipidemia    Past Surgical History  Procedure Laterality Date  . Eye surgery     Family History  Problem Relation Age of Onset  . Hypertension Mother   . Hypertension Father   . Diabetes Mellitus II Brother    Social History  Substance Use Topics  . Smoking status: Former Research scientist (life sciences)  . Smokeless tobacco: None  . Alcohol Use: No    Review of Systems  Unable to perform ROS: Mental status change      Allergies  Review of patient's allergies indicates no known allergies.  Home Medications   Prior to Admission medications   Medication Sig Start Date End Date Taking? Authorizing Provider  aspirin 81 MG tablet Take 81 mg  by mouth daily.   Yes Historical Provider, MD  atorvastatin (LIPITOR) 10 MG tablet Take 10 mg by mouth at bedtime.   Yes Historical Provider, MD  brimonidine (ALPHAGAN) 0.2 % ophthalmic solution Place 1 drop into both eyes 3 (three) times daily.   Yes Historical Provider, MD  carvedilol (COREG) 6.25 MG tablet Take 6.25 mg by mouth 2 (two) times daily. With food. 08/23/14  Yes Historical Provider, MD  cefTRIAXone 1 g in dextrose 5 % 50 mL Inject 1 g into the vein daily.   Yes Historical Provider, MD  dorzolamide-timolol (COSOPT) 22.3-6.8 MG/ML ophthalmic solution Place 1 drop into the right eye 2 (two) times daily.   Yes Historical Provider, MD  feeding supplement, ENSURE ENLIVE, (ENSURE ENLIVE) LIQD Take 237 mLs by mouth 2 (two) times daily between meals. 03/22/15  Yes [name], MD  finasteride (PROSCAR) 5 MG tablet Take 5 mg by mouth daily. 10/11/14  Yes Historical Provider, MD  mirabegron ER (MYRBETRIQ) 25 MG TB24 tablet Take 1 tablet (25 mg total) by mouth daily. Stop after last dose on 03/29/15 03/22/15  Yes David Tat, MD  verapamil (CALAN-SR) 240 MG CR tablet Take 240 mg by mouth at bedtime.   Yes Historical Provider, MD  opium-belladonna (B&O SUPPRETTES) 16.2-60 MG suppository Place 1 suppository  rectally every 6 (six) hours as needed for bladder spasms. 03/22/15   [name], MD  senna-docusate (SENOKOT-S) 8.6-50 MG per tablet Take 1 tablet by mouth at bedtime as needed for mild constipation. 03/22/15   [name], MD   BP 120/78 mmHg  Pulse 82  Temp(Src) 97.6 F (36.4 C) (Oral)  Resp 26  SpO2 98% Physical Exam  Constitutional: He appears well-developed and well-nourished. He appears distressed.  HENT:  Head: Normocephalic and atraumatic.  Mouth/Throat: Oropharynx is clear and moist. No oropharyngeal exudate.  Eyes: EOM are normal. Right eye exhibits no discharge. Left eye exhibits no discharge. No scleral icterus.  L eye has opacification of cornea   Neck: Normal range of motion. Neck  supple. No JVD present. No thyromegaly present.  Cardiovascular: Normal rate, regular rhythm, normal heart sounds and intact distal pulses.  Exam reveals no gallop and no friction rub.   No murmur heard. Pulmonary/Chest: Effort normal and breath sounds normal. No respiratory distress. He has no wheezes. He has no rales.  Abdominal: Soft. Bowel sounds are normal. He exhibits no distension and no mass. There is no tenderness.  Genitourinary:  Foley catheter seated in bladder, normal appearing penis - foley bag has been collecting very dark urine  Musculoskeletal: Normal range of motion. He exhibits edema ( L>R asymmetrical swelling of legs with pitting edema). He exhibits no tenderness.  Lymphadenopathy:    He has no cervical adenopathy.  Neurological: He is alert. Coordination normal.  Mumbles occasional words, able to follow simple commands.  Trouble lifting bilateral legs. - weaker on the R  Skin: Skin is warm and dry. No rash noted. No erythema.  Psychiatric: He has a normal mood and affect. His behavior is normal.  Nursing note and vitals reviewed.   ED Course  Procedures (including critical care time) Labs Review Labs Reviewed  COMPREHENSIVE METABOLIC PANEL - Abnormal; Notable for the following:    Sodium 134 (*)    Potassium 5.7 (*)    CO2 16 (*)    Glucose, Bld 229 (*)    BUN 118 (*)    Creatinine, Ser 8.13 (*)    Albumin 3.1 (*)    ALT 14 (*)    GFR calc non Af Amer 5 (*)    GFR calc Af Amer 6 (*)    Anion gap 16 (*)    All other components within normal limits  CBC WITH DIFFERENTIAL/PLATELET - Abnormal; Notable for the following:    WBC 18.8 (*)    RBC 4.20 (*)    Hemoglobin 12.7 (*)    HCT 36.4 (*)    Neutrophils Relative % 83 (*)    Neutro Abs 15.5 (*)    Lymphocytes Relative 8 (*)    Monocytes Absolute 1.7 (*)    All other components within normal limits  URINALYSIS, ROUTINE W REFLEX MICROSCOPIC (NOT AT [facility]) - Abnormal; Notable for the following:    Color,  Urine RED (*)    APPearance TURBID (*)    Hgb urine dipstick LARGE (*)    Bilirubin Urine MODERATE (*)    Protein, ur 100 (*)    Leukocytes, UA LARGE (*)    All other components within normal limits  URINE MICROSCOPIC-ADD ON - Abnormal; Notable for the following:    Bacteria, UA MANY (*)    All other components within normal limits  CULTURE, BLOOD (ROUTINE X 2)  CULTURE, BLOOD (ROUTINE X 2)  CK  AMMONIA  BASIC METABOLIC PANEL  CBC  I-STAT CG4 LACTIC ACID, ED    Imaging Review No results found. I have personally reviewed and evaluated these images and lab results as part of my medical decision-making.   EKG Interpretation   Date/Time:  Friday March 29 2015 17:14:49 EDT Ventricular Rate:  83 PR Interval:  214 QRS Duration: 88 QT Interval:  396 QTC Calculation: 465 R Axis:   -42 Text Interpretation:  Sinus rhythm Borderline prolonged PR interval Left  anterior fascicular block RSR' in V1 or V2, right VCD or RVH Borderline ST  elevation, anterior leads since last tracing no significant change  Abnormal ekg Confirmed by Sabra Heck  MD, Keyler Hoge ([id number]) on 03/29/2015 5:16:51  PM      MDM   Final diagnoses:  Acute renal failure, unspecified acute renal failure type  UTI (lower urinary tract infection)    Source of AMS may be from reported elevation in Cr - ongoing dehydration - asymetric exam could be c/w stroke as well - CT head, labs, hydrate, anticipate admission.  Of note - a side effect of the medicine Myrbetriq is bladder pain / spasm.  The aid described his pain as coming in spasms today.  Bedside ultrasound reveals fullness, fluid containing bladder, Foley catheter unable to be flushed, acute renal failure likely secondary to Foley catheter obstruction and urinary outlet obstruction in addition to urinary infection.  Care discussed with the hospitalist who will see the patient in the emergency department in consultation for admission  Discussed the care with the  nephrologist who agrees with hydration, observation.  Noemi Chapel, MD 03/29/15 [phone]

## 2015-03-30 DIAGNOSIS — N390 Urinary tract infection, site not specified: Secondary | ICD-10-CM

## 2015-03-30 LAB — RENAL FUNCTION PANEL
ALBUMIN: 2.6 g/dL — AB (ref 3.5–5.0)
ANION GAP: 12 (ref 5–15)
BUN: 99 mg/dL — ABNORMAL HIGH (ref 6–20)
CALCIUM: 8.6 mg/dL — AB (ref 8.9–10.3)
CO2: 20 mmol/L — ABNORMAL LOW (ref 22–32)
CREATININE: 5.68 mg/dL — AB (ref 0.61–1.24)
Chloride: 107 mmol/L (ref 101–111)
GFR, EST AFRICAN AMERICAN: 9 mL/min — AB (ref >60)
GFR, EST NON AFRICAN AMERICAN: 8 mL/min — AB (ref >60)
Glucose, Bld: 139 mg/dL — ABNORMAL HIGH (ref 65–99)
PHOSPHORUS: 5.7 mg/dL — AB (ref 2.5–4.6)
Potassium: 4.5 mmol/L (ref 3.5–5.1)
SODIUM: 139 mmol/L (ref 135–145)

## 2015-03-30 LAB — GLUCOSE, CAPILLARY
GLUCOSE-CAPILLARY: 147 mg/dL — AB (ref 65–99)
GLUCOSE-CAPILLARY: 134 mg/dL — AB (ref 65–99)
Glucose-Capillary: 111 mg/dL — ABNORMAL HIGH (ref 65–99)
Glucose-Capillary: 123 mg/dL — ABNORMAL HIGH (ref 65–99)
Glucose-Capillary: 129 mg/dL — ABNORMAL HIGH (ref 65–99)
Glucose-Capillary: 179 mg/dL — ABNORMAL HIGH (ref 65–99)
Glucose-Capillary: 189 mg/dL — ABNORMAL HIGH (ref 65–99)

## 2015-03-30 LAB — CBC
HEMATOCRIT: 32.4 % — AB (ref 39.0–52.0)
HEMOGLOBIN: 11.2 g/dL — AB (ref 13.0–17.0)
MCH: 29.9 pg (ref 26.0–34.0)
MCHC: 34.6 g/dL (ref 30.0–36.0)
MCV: 86.6 fL (ref 78.0–100.0)
Platelets: 227 10*3/uL (ref 150–400)
RBC: 3.74 MIL/uL — AB (ref 4.22–5.81)
RDW: 13.8 % (ref 11.5–15.5)
WBC: 16.4 10*3/uL — AB (ref 4.0–10.5)

## 2015-03-30 MED ORDER — CHLORHEXIDINE GLUCONATE 0.12 % MT SOLN
15.0000 mL | Freq: Two times a day (BID) | OROMUCOSAL | Status: DC
  Administered 2015-03-30 – 2015-04-05 (×13): 15 mL via OROMUCOSAL
  Filled 2015-03-30 (×15): qty 15

## 2015-03-30 MED ORDER — SENNOSIDES-DOCUSATE SODIUM 8.6-50 MG PO TABS: 1.0000 | ORAL_TABLET | Freq: Every evening | ORAL | Status: DC | PRN

## 2015-03-30 MED ORDER — DORZOLAMIDE HCL-TIMOLOL MAL 2-0.5 % OP SOLN
1.0000 [drp] | Freq: Two times a day (BID) | OPHTHALMIC | Status: DC
  Administered 2015-03-30 – 2015-04-05 (×13): 1 [drp] via OPHTHALMIC
  Filled 2015-03-30: qty 10

## 2015-03-30 MED ORDER — CARVEDILOL 6.25 MG PO TABS
6.2500 mg | ORAL_TABLET | Freq: Two times a day (BID) | ORAL | Status: DC
  Administered 2015-03-30 – 2015-04-05 (×12): 6.25 mg via ORAL
  Filled 2015-03-30 (×13): qty 1

## 2015-03-30 MED ORDER — INFLUENZA VAC SPLIT QUAD 0.5 ML IM SUSY
0.5000 mL | PREFILLED_SYRINGE | INTRAMUSCULAR | Status: DC
  Filled 2015-03-30 (×2): qty 0.5

## 2015-03-30 MED ORDER — ENSURE ENLIVE PO LIQD
237.0000 mL | Freq: Two times a day (BID) | ORAL | Status: DC
  Administered 2015-03-30 – 2015-04-05 (×9): 237 mL via ORAL

## 2015-03-30 MED ORDER — FINASTERIDE 5 MG PO TABS
5.0000 mg | ORAL_TABLET | Freq: Every day | ORAL | Status: DC
  Administered 2015-03-30 – 2015-04-05 (×7): 5 mg via ORAL
  Filled 2015-03-30 (×7): qty 1

## 2015-03-30 MED ORDER — VERAPAMIL HCL ER 240 MG PO TBCR
240.0000 mg | EXTENDED_RELEASE_TABLET | Freq: Every day | ORAL | Status: DC
  Administered 2015-03-30 – 2015-04-04 (×6): 240 mg via ORAL
  Filled 2015-03-30 (×7): qty 1

## 2015-03-30 MED ORDER — CETYLPYRIDINIUM CHLORIDE 0.05 % MT LIQD
7.0000 mL | Freq: Two times a day (BID) | OROMUCOSAL | Status: DC
  Administered 2015-03-30 – 2015-04-04 (×9): 7 mL via OROMUCOSAL

## 2015-03-30 MED ORDER — MIRABEGRON ER 25 MG PO TB24: 25.0000 mg | ORAL_TABLET | Freq: Every day | ORAL | Status: DC

## 2015-03-30 MED ORDER — BRIMONIDINE TARTRATE 0.2 % OP SOLN
1.0000 [drp] | Freq: Three times a day (TID) | OPHTHALMIC | Status: DC
  Administered 2015-03-30 – 2015-04-05 (×19): 1 [drp] via OPHTHALMIC
  Filled 2015-03-30: qty 5

## 2015-03-30 NOTE — Progress Notes (Signed)
  [facility] Progress Note   Subjective: good UOP,3300 total, creat down to 5.68  Filed Vitals:   03/29/15 2136 03/29/15 2200 03/30/15 0432 03/30/15 1400  BP: 134/68  135/69 133/68  Pulse: 84  82 83  Temp: 97 F (36.1 C)  97.2 F (36.2 C) 97.4 F (36.3 C)  TempSrc: Axillary  Axillary Oral  Resp: 24  35 20  Height:  6' (1.829 m)    Weight:  103.1 kg (227 lb 4.7 oz)    SpO2: 98%  98% 99%   Exam: General appearance: cachectic and lethargic and minimally responsive Head: Normocephalic, without obvious abnormality, atraumatic Resp: transmitted upper airway sounds otherwise CTA Cardio: regular rate and rhythm, no rub and faint heart sounds GI: soft, non-tender; bowel sounds normal; no masses, no organomegaly Extremities: edema trace pretibial edema      Assessment: 1 AKI obstructed foley/ BOO/ gross hematuria - improved w foley replacement 2 Hyperkalemia, resolving 3 DM 4 HTN stable 5 FTT need to address codes status this time around   Plan - expect continued improvement, no further suggestions. Will sign off.     Kelly Splinter MD  pager [phone]    cell [phone]  03/30/2015, 3:07 PM     Recent Labs Lab 03/29/15 1611 03/29/15 2100 03/30/15 0515  NA 134* 134* 139  K 5.7* 5.4* 4.5  CL 102 104 107  CO2 16* 15* 20*  GLUCOSE 229* 189* 139*  BUN 118* 105* 99*  CREATININE 8.13* 7.06* 5.68*  CALCIUM 9.1 8.8* 8.6*  PHOS  --   --  5.7*    Recent Labs Lab 03/29/15 1611 03/30/15 0515  AST 36  --   ALT 14*  --   ALKPHOS 102  --   BILITOT 0.3  --   PROT 7.3  --   ALBUMIN 3.1* 2.6*    Recent Labs Lab 03/29/15 1611 03/30/15 0515  WBC 18.8* 16.4*  NEUTROABS 15.5*  --   HGB 12.7* 11.2*  HCT 36.4* 32.4*  MCV 86.7 86.6  PLT 257 227   . antiseptic oral rinse  7 mL Mouth Rinse q12n4p  . brimonidine  1 drop Both Eyes TID  . carvedilol  6.25 mg Oral BID  . cefTRIAXone (ROCEPHIN)  IV  1 g Intravenous Q24H  . chlorhexidine  15 mL Mouth Rinse  BID  . dorzolamide-timolol  1 drop Right Eye BID  . feeding supplement (ENSURE ENLIVE)  237 mL Oral BID BM  . finasteride  5 mg Oral Daily  . [START ON 03/31/2015] Influenza vac split quadrivalent PF  0.5 mL Intramuscular Tomorrow-1000  . insulin aspart  0-9 Units Subcutaneous TID WC  . verapamil  240 mg Oral QHS   .  sodium bicarbonate 150 mEq in sterile water 1000 mL infusion 125 mL/hr at 03/30/15 5003   acetaminophen **OR** acetaminophen, ondansetron **OR** ondansetron (ZOFRAN) IV, opium-belladonna, senna-docusate

## 2015-03-30 NOTE — Progress Notes (Addendum)
[facility] Progress Note   Kent Mayo JEH:[id number] DOB: June 01, 1927 DOA: 03/29/2015 PCP: No primary care provider on file.  Brief narrative: Kent Mayo is a 81 y.o. male with diabetes mellitus, hypertension, dyslipidemia and glaucoma who has an indwelling Foley catheter. He was sent from the nursing facility for altered mental status and was found to have acute renal failure and hyperkalemia on blood work.  The patient was admitted to the hospital last month with acute renal failure, confusion and urinary retention. He required a Foley catheter. Renal function and confusion resolved and he was discharged on 8/19 with Foley in place and follow up with urology. In the ER was noted that his bladder was severely distended.There was a small amount of bloody urine and clots in the Foley bag.  Foley could not be flushed. The Foley was changed by urology and greater than 1 L of clear amber colored urine without clots drained.  Subjective: Still sleepy. He is not very communicative with me but per RN, he is answering some questions for her. Unable to assess if he has any complaints.  Assessment/Plan: Principal Problem:   AKI (acute kidney injury) - Due to obstructed Foley which has resolved after catheter change -Noted improvement in renal function -Continue to match urine output from postobstructive diuresis with IV fluids -Nephrology assisting with management-have placed him on a bicarbonate infusion due to acidosis and hyperkalemia  Active Problems: Hyperkalemia - Potassium normalized    Urinary retention/BPH - Foley catheter was initially placed for this reason-urology placed a 20 [place] [employer] catheter over a wire on admission and has recommended that the catheter be continued for at least 1 more week prior to giving a voiding trial due to stretch injury and traumatic Foley insertion -Resume Proscar when able to take orals -When necessary B and O suppositories for  bladder spasm  Pedal edema -Possibly secondary to acute renal failure-elevate legs    Acute encephalopathy -Due to urinary retention and acute renal failure-follow-continue nothing by mouth until more alert    UTI? - He has leukocytosis which may be related to UTI-UA is grossly positive for WBCs but this is in setting of a chronic Foley -Started Rocephin-follow culture  Hematuria -Urine was bloody in the ER and his bloody again today-management per urology    HTN (hypertension) -Hold verapamil and Coreg as he is nothing by mouth-continue IV Lopressor    Diabetes mellitus -Low-dose sliding scale while nothing by mouth  Glaucoma-in -Continue Alphagan and Cosopt   Code Status:     Code Status Orders        Start     Ordered   03/29/15 1744  Full code   Continuous     03/29/15 1745     Family Communication:  Disposition Plan: Return to SNF on discharge DVT prophylaxis: SCDs Consultants: Urology, nephrology Procedures: Foley catheter placement 8/26  Antibiotics: Anti-infectives    Start     Dose/Rate Route Frequency Ordered Stop   03/30/15 1800  cefTRIAXone (ROCEPHIN) 1 g in dextrose 5 % 50 mL IVPB     1 g 100 mL/hr over 30 Minutes Intravenous Every 24 hours 03/29/15 1748     03/29/15 1800  cefTRIAXone (ROCEPHIN) 1 g in dextrose 5 % 50 mL IVPB  Status:  Discontinued     1 g 100 mL/hr over 30 Minutes Intravenous Every 24 hours 03/29/15 1747 03/29/15 1748   03/29/15 1715  cefTRIAXone (ROCEPHIN) 1 g in dextrose 5 % 50 mL IVPB  1 g 100 mL/hr over 30 Minutes Intravenous  Once 03/29/15 1713 03/29/15 1752      Objective: Filed Weights   03/29/15 2200  Weight: 103.1 kg (227 lb 4.7 oz)    Intake/Output Summary (Last 24 hours) at 03/30/15 1023 Last data filed at 03/30/15 0730  Gross per 24 hour  Intake      0 ml  Output   2600 ml  Net  -2600 ml     Vitals Filed Vitals:   03/29/15 2012 03/29/15 2136 03/29/15 2200 03/30/15 0432  BP: 125/81 134/68  135/69   Pulse: 85 84  82  Temp: 97.3 F (36.3 C) 97 F (36.1 C)  97.2 F (36.2 C)  TempSrc: Axillary Axillary  Axillary  Resp: 30 24  35  Height:   6' (1.829 m)   Weight:   103.1 kg (227 lb 4.7 oz)   SpO2: 100% 98%  98%    Exam:  General:  Pt is sleepy-moans and mumbles when being called-not following commands  HEENT: Cataract in left eye-No icterus, No thrush, oral mucosa moist  Cardiovascular: regular rate and rhythm, S1/S2 No murmur  Respiratory: clear to auscultation bilaterally   Abdomen: Soft, +Bowel sounds, non tender, non distended, no guarding  MSK: No cyanosis or clubbing- 2+ LE edema  Data Reviewed: Basic Metabolic Panel:  Recent Labs Lab 03/29/15 1611 03/29/15 2100 03/30/15 0515  NA 134* 134* 139  K 5.7* 5.4* 4.5  CL 102 104 107  CO2 16* 15* 20*  GLUCOSE 229* 189* 139*  BUN 118* 105* 99*  CREATININE 8.13* 7.06* 5.68*  CALCIUM 9.1 8.8* 8.6*  PHOS  --   --  5.7*   Liver Function Tests:  Recent Labs Lab 03/29/15 1611 03/30/15 0515  AST 36  --   ALT 14*  --   ALKPHOS 102  --   BILITOT 0.3  --   PROT 7.3  --   ALBUMIN 3.1* 2.6*   No results for input(s): LIPASE, AMYLASE in the last 168 hours.  Recent Labs Lab 03/29/15 1612  AMMONIA 32   CBC:  Recent Labs Lab 03/29/15 1611 03/30/15 0515  WBC 18.8* 16.4*  NEUTROABS 15.5*  --   HGB 12.7* 11.2*  HCT 36.4* 32.4*  MCV 86.7 86.6  PLT 257 227   Cardiac Enzymes:  Recent Labs Lab 03/29/15 1611  CKTOTAL 107   BNP (last 3 results) No results for input(s): BNP in the last 8760 hours.  ProBNP (last 3 results) No results for input(s): PROBNP in the last 8760 hours.  CBG:  Recent Labs Lab 03/29/15 2036 03/30/15 0004 03/30/15 0428 03/30/15 0732  GLUCAP 176* 179* 147* 134*    No results found for this or any previous visit (from the past 240 hour(s)).   Studies: No results found.  Scheduled Meds:  Scheduled Meds: . antiseptic oral rinse  7 mL Mouth Rinse q12n4p  .  cefTRIAXone (ROCEPHIN)  IV  1 g Intravenous Q24H  . chlorhexidine  15 mL Mouth Rinse BID  . [START ON 03/31/2015] Influenza vac split quadrivalent PF  0.5 mL Intramuscular Tomorrow-1000  . insulin aspart  0-9 Units Subcutaneous TID WC   Continuous Infusions: .  sodium bicarbonate 150 mEq in sterile water 1000 mL infusion 125 mL/hr at 03/30/15 4034    Time spent on care of this patient: 35 min   [place], MD 03/30/2015, 10:23 AM  LOS: 1 day   [facility] Office  [phone] Pager - Text Page per [email or web address]  If 7PM-7AM, please contact night-coverage [email or web address]

## 2015-03-30 NOTE — Progress Notes (Signed)
  Subjective: Patient reports suprapubic pain. Creatinine has improved to 5.68. Urine continue to be bloody.  Objective: Vital signs in last 24 hours: Temp:  [97 F (36.1 C)-97.6 F (36.4 C)] 97.2 F (36.2 C) (08/27 0432) Pulse Rate:  [82-85] 82 (08/27 0432) Resp:  [18-35] 35 (08/27 0432) BP: (120-135)/(68-89) 135/69 mmHg (08/27 0432) SpO2:  [89 %-100 %] 98 % (08/27 0432) Weight:  [103.1 kg (227 lb 4.7 oz)] 103.1 kg (227 lb 4.7 oz) (08/26 2200)  Intake/Output from previous day: 08/26 0701 - 08/27 0700 In: -  Out: 2600 [Urine:2600] Intake/Output this shift: Total I/O In: 0  Out: 350 [Urine:350]  Physical Exam:  General:alert and appears stated age GI: soft, non tender, normal bowel sounds, no palpable masses, no organomegaly, no inguinal hernia Male genitalia: no penile lesions or discharge no testicular masses Resp: clear to auscultation bilaterally Extremities: extremities normal, atraumatic, no cyanosis or edema  Lab Results:  Recent Labs  03/29/15 1611 03/30/15 0515  HGB 12.7* 11.2*  HCT 36.4* 32.4*   BMET  Recent Labs  03/29/15 2100 03/30/15 0515  NA 134* 139  K 5.4* 4.5  CL 104 107  CO2 15* 20*  GLUCOSE 189* 139*  BUN 105* 99*  CREATININE 7.06* 5.68*  CALCIUM 8.8* 8.6*   No results for input(s): LABPT, INR in the last 72 hours. No results for input(s): LABURIN in the last 72 hours. Results for orders placed or performed during the hospital encounter of 03/18/15  Culture, Urine     Status: None   Collection Time: 03/19/15  2:29 AM  Result Value Ref Range Status   Specimen Description URINE, CATHETERIZED  Final   Special Requests NONE  Final   Culture NO GROWTH 1 DAY  Final   Report Status 03/20/2015 FINAL  Final    Studies/Results: No results found.  Assessment/Plan: 81yo with urinary retention, ARF, and gross hematuria  Recs: 1. Continue foley to straight drain 2. Continue rocephin pending urine culture 3. Urology to continue to  follow  LOS: 1 day   Rohen Kimes L 03/30/2015, 11:54 AM

## 2015-03-31 LAB — BASIC METABOLIC PANEL
Anion gap: 10 (ref 5–15)
BUN: 75 mg/dL — AB (ref 6–20)
CHLORIDE: 103 mmol/L (ref 101–111)
CO2: 31 mmol/L (ref 22–32)
CREATININE: 3.21 mg/dL — AB (ref 0.61–1.24)
Calcium: 8.4 mg/dL — ABNORMAL LOW (ref 8.9–10.3)
GFR calc Af Amer: 19 mL/min — ABNORMAL LOW (ref >60)
GFR calc non Af Amer: 16 mL/min — ABNORMAL LOW (ref >60)
GLUCOSE: 114 mg/dL — AB (ref 65–99)
POTASSIUM: 3.8 mmol/L (ref 3.5–5.1)
SODIUM: 144 mmol/L (ref 135–145)

## 2015-03-31 LAB — CBC
HEMATOCRIT: 35.5 % — AB (ref 39.0–52.0)
Hemoglobin: 12.1 g/dL — ABNORMAL LOW (ref 13.0–17.0)
MCH: 30.6 pg (ref 26.0–34.0)
MCHC: 34.1 g/dL (ref 30.0–36.0)
MCV: 89.9 fL (ref 78.0–100.0)
PLATELETS: 216 10*3/uL (ref 150–400)
RBC: 3.95 MIL/uL — ABNORMAL LOW (ref 4.22–5.81)
RDW: 13.7 % (ref 11.5–15.5)
WBC: 14.6 10*3/uL — AB (ref 4.0–10.5)

## 2015-03-31 LAB — GLUCOSE, CAPILLARY
Glucose-Capillary: 105 mg/dL — ABNORMAL HIGH (ref 65–99)
Glucose-Capillary: 127 mg/dL — ABNORMAL HIGH (ref 65–99)
Glucose-Capillary: 123 mg/dL — ABNORMAL HIGH (ref 65–99)
Glucose-Capillary: 125 mg/dL — ABNORMAL HIGH (ref 65–99)

## 2015-03-31 LAB — URINE CULTURE: Culture: 9000

## 2015-03-31 MED ORDER — SODIUM CHLORIDE 0.9 % IV SOLN
INTRAVENOUS | Status: DC
  Administered 2015-03-31 – 2015-04-05 (×9): via INTRAVENOUS

## 2015-03-31 NOTE — Progress Notes (Addendum)
[facility] Progress Note   Kent Mayo UEA:[id number] DOB: 01-13-1927 DOA: 03/29/2015 PCP: No primary care provider on file.  Brief narrative: Kent Mayo is a 81 y.o. male with diabetes mellitus, hypertension, dyslipidemia and glaucoma who has an indwelling Foley catheter. He was sent from the nursing facility for altered mental status and was found to have acute renal failure and hyperkalemia on blood work.  The patient was admitted to the hospital last month with acute renal failure, confusion and urinary retention. He required a Foley catheter. Renal function and confusion resolved and he was discharged on 8/19 with Foley in place and follow up with urology. In the ER was noted that his bladder was severely distended.There was a small amount of bloody urine and clots in the Foley bag.  Foley could not be flushed. The Foley was changed by urology and greater than 1 L of clear amber colored urine without clots drained.  Subjective: Now alert and oriented today. He knows that he is in the hospital and that he came here from a nursing facility. He has no complaints of nausea vomiting abdominal pain or suprapubic pain.  Assessment/Plan: Principal Problem:   AKI (acute kidney injury)/metabolic acidosis - Due to obstructed Foley which has resolved after catheter change -Renal Function continues to improve rapidly -Will change bicarbonate infusion to normal saline today as bicarbonate is rising-will decrease rate as he is now eating and drinking  Active Problems: Hyperkalemia - Potassium normalized    Urinary retention/BPH - Foley catheter was initially placed for this reason-urology placed a 20 [place] [employer] catheter over a wire on admission and has recommended that the catheter be continued for at least 1 more week prior to giving a voiding trial due to stretch injury and traumatic Foley insertion -Resumed Proscar  -When necessary B and O suppositories for bladder  spasm  Pedal edema -Possibly secondary to acute renal failure-has resolved today    Acute encephalopathy -Due to urinary retention and acute renal failure-resolved by 8/27 afternoon-placed on solid food and resumed oral medication    UTI? - He has leukocytosis which may be related to UTI-UA is grossly positive for WBCs but this is in setting of a chronic Foley -Started Rocephin on admission- culture negative so far-we'll discontinue Rocephin  Hematuria -Urine was bloody in the ER and -used to be bloody-management per urology    HTN (hypertension) -Held verapamil and Coreg while lethargic and given IV Lopressor -Oral medications resumed 8/27    Diabetes mellitus -Low-dose sliding scale with meals  Glaucoma- -Continue Alphagan and Cosopt   Code Status:     Code Status Orders        Start     Ordered   03/29/15 1744  Full code   Continuous     03/29/15 1745     Family Communication: Brother Rolan Bucco who is power of attorney Disposition Plan: Return to SNF on discharge-hopefully tomorrow DVT prophylaxis: SCDs Consultants: Urology, nephrology Procedures: Foley catheter placement 8/26  Antibiotics: Anti-infectives    Start     Dose/Rate Route Frequency Ordered Stop   03/30/15 1800  cefTRIAXone (ROCEPHIN) 1 g in dextrose 5 % 50 mL IVPB  Status:  Discontinued     1 g 100 mL/hr over 30 Minutes Intravenous Every 24 hours 03/29/15 1748 03/31/15 0721   03/29/15 1800  cefTRIAXone (ROCEPHIN) 1 g in dextrose 5 % 50 mL IVPB  Status:  Discontinued     1 g 100 mL/hr over 30 Minutes Intravenous  Every 24 hours 03/29/15 1747 03/29/15 1748   03/29/15 1715  cefTRIAXone (ROCEPHIN) 1 g in dextrose 5 % 50 mL IVPB     1 g 100 mL/hr over 30 Minutes Intravenous  Once 03/29/15 1713 03/29/15 1752      Objective: Filed Weights   03/29/15 2200 03/31/15 0509  Weight: 103.1 kg (227 lb 4.7 oz) 102.1 kg (225 lb 1.4 oz)    Intake/Output Summary (Last 24 hours) at 03/31/15 1024 Last data  filed at 03/31/15 0600  Gross per 24 hour  Intake   3410 ml  Output   2670 ml  Net    740 ml     Vitals Filed Vitals:   03/30/15 1400 03/30/15 2131 03/30/15 2134 03/31/15 0509  BP: 133/68 130/76 130/76 158/63  Pulse: 83 81 81 62  Temp: 97.4 F (36.3 C) 97.9 F (36.6 C) 97.9 F (36.6 C) 97.4 F (36.3 C)  TempSrc: Oral Oral Oral Oral  Resp: 20 18 18 18   Height:      Weight:    102.1 kg (225 lb 1.4 oz)  SpO2: 99% 99% 99% 98%    Exam:  General:  Pt is awake alert oriented 3-appears quite comfortable  HEENT: Cataract in left eye-No icterus, No thrush, oral mucosa moist  Cardiovascular: regular rate and rhythm, S1/S2 No murmur  Respiratory: clear to auscultation bilaterally   Abdomen: Soft, +Bowel sounds, non tender, non distended, no guarding  MSK: No cyanosis or clubbing- 2+ LE edema  Data Reviewed: Basic Metabolic Panel:  Recent Labs Lab 03/29/15 1611 03/29/15 2100 03/30/15 0515 03/31/15 0530  NA 134* 134* 139 144  K 5.7* 5.4* 4.5 3.8  CL 102 104 107 103  CO2 16* 15* 20* 31  GLUCOSE 229* 189* 139* 114*  BUN 118* 105* 99* 75*  CREATININE 8.13* 7.06* 5.68* 3.21*  CALCIUM 9.1 8.8* 8.6* 8.4*  PHOS  --   --  5.7*  --    Liver Function Tests:  Recent Labs Lab 03/29/15 1611 03/30/15 0515  AST 36  --   ALT 14*  --   ALKPHOS 102  --   BILITOT 0.3  --   PROT 7.3  --   ALBUMIN 3.1* 2.6*   No results for input(s): LIPASE, AMYLASE in the last 168 hours.  Recent Labs Lab 03/29/15 1612  AMMONIA 32   CBC:  Recent Labs Lab 03/29/15 1611 03/30/15 0515 03/31/15 0530  WBC 18.8* 16.4* 14.6*  NEUTROABS 15.5*  --   --   HGB 12.7* 11.2* 12.1*  HCT 36.4* 32.4* 35.5*  MCV 86.7 86.6 89.9  PLT 257 227 216   Cardiac Enzymes:  Recent Labs Lab 03/29/15 1611  CKTOTAL 107   BNP (last 3 results) No results for input(s): BNP in the last 8760 hours.  ProBNP (last 3 results) No results for input(s): PROBNP in the last 8760 hours.  CBG:  Recent  Labs Lab 03/30/15 1148 03/30/15 1616 03/30/15 1934 03/30/15 2339 03/31/15 0716  GLUCAP 111* 123* 189* 129* 105*    Recent Results (from the past 240 hour(s))  Urine culture     Status: None (Preliminary result)   Collection Time: 03/29/15  6:52 PM  Result Value Ref Range Status   Specimen Description URINE, CLEAN CATCH  Final   Special Requests NONE  Final   Culture   Final    NO GROWTH < 24 HOURS Performed at [facility]    Report Status PENDING  Incomplete  Culture, blood (routine  x 2)     Status: None (Preliminary result)   Collection Time: 03/29/15  8:55 PM  Result Value Ref Range Status   Specimen Description BLOOD RIGHT ANTECUBITAL  Final   Special Requests BOTTLES DRAWN AEROBIC AND ANAEROBIC 10ML  Final   Culture   Final    NO GROWTH < 24 HOURS Performed at [facility]    Report Status PENDING  Incomplete  Culture, blood (routine x 2)     Status: None (Preliminary result)   Collection Time: 03/29/15  9:00 PM  Result Value Ref Range Status   Specimen Description BLOOD RIGHT HAND  Final   Special Requests PEDIATRICS 2ML  Final   Culture   Final    NO GROWTH < 24 HOURS Performed at [facility]    Report Status PENDING  Incomplete     Studies: No results found.  Scheduled Meds:  Scheduled Meds: . antiseptic oral rinse  7 mL Mouth Rinse q12n4p  . brimonidine  1 drop Both Eyes TID  . carvedilol  6.25 mg Oral BID  . chlorhexidine  15 mL Mouth Rinse BID  . dorzolamide-timolol  1 drop Right Eye BID  . feeding supplement (ENSURE ENLIVE)  237 mL Oral BID BM  . finasteride  5 mg Oral Daily  . Influenza vac split quadrivalent PF  0.5 mL Intramuscular Tomorrow-1000  . insulin aspart  0-9 Units Subcutaneous TID WC  . verapamil  240 mg Oral QHS   Continuous Infusions: . sodium chloride 100 mL/hr at 03/31/15 3335    Time spent on care of this patient: 35 min   [place], MD 03/31/2015, 10:24 AM  LOS: 2 days   [facility] Office  [phone] Pager - Text Page per [email or web address] If 7PM-7AM, please contact night-coverage [email or web address]

## 2015-04-01 LAB — CBC
HCT: 32.7 % — ABNORMAL LOW (ref 39.0–52.0)
Hemoglobin: 10.8 g/dL — ABNORMAL LOW (ref 13.0–17.0)
MCH: 30.3 pg (ref 26.0–34.0)
MCHC: 33 g/dL (ref 30.0–36.0)
MCV: 91.6 fL (ref 78.0–100.0)
PLATELETS: 236 10*3/uL (ref 150–400)
RBC: 3.57 MIL/uL — ABNORMAL LOW (ref 4.22–5.81)
RDW: 13.8 % (ref 11.5–15.5)
WBC: 14.7 10*3/uL — AB (ref 4.0–10.5)

## 2015-04-01 LAB — BASIC METABOLIC PANEL
Anion gap: 8 (ref 5–15)
BUN: 46 mg/dL — AB (ref 6–20)
CO2: 31 mmol/L (ref 22–32)
CREATININE: 2.05 mg/dL — AB (ref 0.61–1.24)
Calcium: 8.3 mg/dL — ABNORMAL LOW (ref 8.9–10.3)
Chloride: 104 mmol/L (ref 101–111)
GFR calc Af Amer: 32 mL/min — ABNORMAL LOW (ref >60)
GFR, EST NON AFRICAN AMERICAN: 28 mL/min — AB (ref >60)
GLUCOSE: 129 mg/dL — AB (ref 65–99)
Potassium: 3.9 mmol/L (ref 3.5–5.1)
SODIUM: 143 mmol/L (ref 135–145)

## 2015-04-01 LAB — GLUCOSE, CAPILLARY
GLUCOSE-CAPILLARY: 134 mg/dL — AB (ref 65–99)
GLUCOSE-CAPILLARY: 153 mg/dL — AB (ref 65–99)
Glucose-Capillary: 115 mg/dL — ABNORMAL HIGH (ref 65–99)
Glucose-Capillary: 109 mg/dL — ABNORMAL HIGH (ref 65–99)

## 2015-04-01 MED ORDER — CEFPODOXIME PROXETIL 200 MG PO TABS
200.0000 mg | ORAL_TABLET | Freq: Two times a day (BID) | ORAL | Status: DC
  Administered 2015-04-01 – 2015-04-03 (×5): 200 mg via ORAL
  Filled 2015-04-01 (×7): qty 1

## 2015-04-01 NOTE — Clinical Social Work Note (Signed)
Clinical Social Work Assessment  Patient Details  Name: Kent Mayo MRN: [id number] Date of Birth: 1927-06-19  Date of referral:  04/01/15               Reason for consult:  Facility Placement                Permission sought to share information with:  Family Supports Permission granted to share information::  Yes, Verbal Permission Granted  Name::     Chief Executive Officer::     Relationship::  brother  Contact Information:     Housing/Transportation Living arrangements for the past 2 months:  [place] of Information:  Patient Patient Interpreter Needed:  None Criminal Activity/Legal Involvement Pertinent to Current Situation/Hospitalization:  No - Comment as needed Significant Relationships:  Siblings Lives with:  Facility Resident Do you feel safe going back to the place where you live?  Yes Need for family participation in patient care:  Yes (Comment)  Care giving concerns:  Patient was at SNF receiving rehab prior to admission. Patient and family desire patient to return to SNF at DC.   Social Worker assessment / plan:  CSW received referral to complete psychosocial assessment. CSW reviewed chart and met with patient at bedside. Patient confused and unable to provide background information. CSW spoke with brother Rolan Bucco) to confirm plans.  Patient has been at [facility] for rehab. Patient will return to SNF when stable and brother reports he is aware of process because he has been to SNF in the past. Brother has CSW contact information if needed.  FL2 completed and placed on chart. CSW spoke with SNF who is agreeable to accept patient when ready to DC.  Employment status:  Retired Health visitor, Managed Care PT Recommendations:  Not assessed at this time Information / Referral to community resources:  [facility]  Patient/Family's Response to care:  Patient unable to fully participate. Brother appreciative of  call.  Patient/Family's Understanding of and Emotional Response to Diagnosis, Current Treatment, and Prognosis:  Brother reports patient has been declining recently. Brother is trying to be as supportive as possible but reports he is worried that patient will never be able to return home.  Emotional Assessment Appearance:  Appears stated age Attitude/Demeanor/Rapport:  Other (Confused) Affect (typically observed):  Flat Orientation:  Oriented to Self Alcohol / Substance use:  Not Applicable Psych involvement (Current and /or in the community):  No (Comment)  Discharge Needs  Concerns to be addressed:  No discharge needs identified Readmission within the last 30 days:  Yes Current discharge risk:  None Barriers to Discharge:  No Barriers Identified   Boone Master, Ward 04/01/2015, 10:48 AM [phone]

## 2015-04-01 NOTE — Progress Notes (Signed)
Output via urinary catheter red with blood clots. Urologist flushed with 568mL sterile water.

## 2015-04-01 NOTE — Progress Notes (Signed)
  Subjective: Patient reports resolution of suprapubic pain. Creatinine has improved to 2.05. Urine continue to be bloody with multiple clots. 500cc of clot evacuated from the bladder  Objective: Vital signs in last 24 hours: Temp:  [97.4 F (36.3 C)-97.8 F (36.6 C)] 97.6 F (36.4 C) (08/29 0544) Pulse Rate:  [62-77] 62 (08/29 0544) Resp:  [18] 18 (08/29 0544) BP: (147-155)/(62-68) 155/64 mmHg (08/29 0544) SpO2:  [94 %-100 %] 97 % (08/29 0544) Weight:  [103.4 kg (227 lb 15.3 oz)] 103.4 kg (227 lb 15.3 oz) (08/29 0544)  Intake/Output from previous day: 08/28 0701 - 08/29 0700 In: 2640 [P.O.:240; I.V.:2400] Out: 2100 [Urine:2100] Intake/Output this shift: Total I/O In: 240 [P.O.:240] Out: 550 [Urine:550]  Physical Exam:  General:alert and appears stated age GI: soft, non tender, normal bowel sounds, no palpable masses, no organomegaly, no inguinal hernia Male genitalia: no penile lesions or discharge no testicular masses Resp: clear to auscultation bilaterally Extremities: extremities normal, atraumatic, no cyanosis or edema  Lab Results:  Recent Labs  03/30/15 0515 03/31/15 0530 04/01/15 0510  HGB 11.2* 12.1* 10.8*  HCT 32.4* 35.5* 32.7*   BMET  Recent Labs  03/31/15 0530 04/01/15 0510  NA 144 143  K 3.8 3.9  CL 103 104  CO2 31 31  GLUCOSE 114* 129*  BUN 75* 46*  CREATININE 3.21* 2.05*  CALCIUM 8.4* 8.3*   No results for input(s): LABPT, INR in the last 72 hours. No results for input(s): LABURIN in the last 72 hours. Results for orders placed or performed during the hospital encounter of 03/29/15  Urine culture     Status: None   Collection Time: 03/29/15  6:52 PM  Result Value Ref Range Status   Specimen Description URINE, CLEAN CATCH  Final   Special Requests NONE  Final   Culture   Final    9,000 COLONIES/mL INSIGNIFICANT GROWTH Performed at [facility]    Report Status 03/31/2015 FINAL  Final  Culture, blood (routine x 2)     Status:  None (Preliminary result)   Collection Time: 03/29/15  8:55 PM  Result Value Ref Range Status   Specimen Description BLOOD RIGHT ANTECUBITAL  Final   Special Requests BOTTLES DRAWN AEROBIC AND ANAEROBIC 10ML  Final   Culture   Final    NO GROWTH 2 DAYS Performed at [facility]    Report Status PENDING  Incomplete  Culture, blood (routine x 2)     Status: None (Preliminary result)   Collection Time: 03/29/15  9:00 PM  Result Value Ref Range Status   Specimen Description BLOOD RIGHT HAND  Final   Special Requests PEDIATRICS 2ML  Final   Culture   Final    NO GROWTH 2 DAYS Performed at [facility]    Report Status PENDING  Incomplete    Studies/Results: No results found.  Assessment/Plan: 81yo with urinary retention, ARF, and gross hematuria  Recs: 1. Continue foley to straight drain and on light traction 2. Continue rocephin pending urine culture 3. Urology to continue to follow  LOS: 3 days   Madylyn Insco L 04/01/2015, 8:13 AM

## 2015-04-01 NOTE — Care Management Important Message (Signed)
Important Message  Patient Details  Name: Kent Mayo MRN: [id number] Date of Birth: 1926-12-10   Medicare Important Message Given:  Yes-second notification given    Camillo Flaming 04/01/2015, 12:16 [place] Message  Patient Details  Name: Kent Mayo MRN: [id number] Date of Birth: 1927-01-08   Medicare Important Message Given:  Yes-second notification given    Camillo Flaming 04/01/2015, 12:16 PM

## 2015-04-01 NOTE — Progress Notes (Signed)
  Subjective: Patient reports suprapubic pain. Creatinine has improved to 3.2. Urine continue to be bloody.  Objective: Vital signs in last 24 hours: Temp:  [97.4 F (36.3 C)-97.8 F (36.6 C)] 97.6 F (36.4 C) (08/29 0544) Pulse Rate:  [62-77] 62 (08/29 0544) Resp:  [18] 18 (08/29 0544) BP: (147-155)/(62-68) 155/64 mmHg (08/29 0544) SpO2:  [94 %-100 %] 97 % (08/29 0544) Weight:  [103.4 kg (227 lb 15.3 oz)] 103.4 kg (227 lb 15.3 oz) (08/29 0544)  Intake/Output from previous day: 08/28 0701 - 08/29 0700 In: 2640 [P.O.:240; I.V.:2400] Out: 2100 [Urine:2100] Intake/Output this shift: Total I/O In: 240 [P.O.:240] Out: 550 [Urine:550]  Physical Exam:  General:alert and appears stated age GI: soft, non tender, normal bowel sounds, no palpable masses, no organomegaly, no inguinal hernia Male genitalia: no penile lesions or discharge no testicular masses Resp: clear to auscultation bilaterally Extremities: extremities normal, atraumatic, no cyanosis or edema  Lab Results:  Recent Labs  03/30/15 0515 03/31/15 0530 04/01/15 0510  HGB 11.2* 12.1* 10.8*  HCT 32.4* 35.5* 32.7*   BMET  Recent Labs  03/31/15 0530 04/01/15 0510  NA 144 143  K 3.8 3.9  CL 103 104  CO2 31 31  GLUCOSE 114* 129*  BUN 75* 46*  CREATININE 3.21* 2.05*  CALCIUM 8.4* 8.3*   No results for input(s): LABPT, INR in the last 72 hours. No results for input(s): LABURIN in the last 72 hours. Results for orders placed or performed during the hospital encounter of 03/29/15  Urine culture     Status: None   Collection Time: 03/29/15  6:52 PM  Result Value Ref Range Status   Specimen Description URINE, CLEAN CATCH  Final   Special Requests NONE  Final   Culture   Final    9,000 COLONIES/mL INSIGNIFICANT GROWTH Performed at [facility]    Report Status 03/31/2015 FINAL  Final  Culture, blood (routine x 2)     Status: None (Preliminary result)   Collection Time: 03/29/15  8:55 PM  Result  Value Ref Range Status   Specimen Description BLOOD RIGHT ANTECUBITAL  Final   Special Requests BOTTLES DRAWN AEROBIC AND ANAEROBIC 10ML  Final   Culture   Final    NO GROWTH 2 DAYS Performed at [facility]    Report Status PENDING  Incomplete  Culture, blood (routine x 2)     Status: None (Preliminary result)   Collection Time: 03/29/15  9:00 PM  Result Value Ref Range Status   Specimen Description BLOOD RIGHT HAND  Final   Special Requests PEDIATRICS 2ML  Final   Culture   Final    NO GROWTH 2 DAYS Performed at [facility]    Report Status PENDING  Incomplete    Studies/Results: No results found.  Assessment/Plan: 81yo with urinary retention, ARF, and gross hematuria  Recs: 1. Continue foley to straight drain 2. Continue rocephin pending urine culture 3. Urology to continue to follow  LOS: 3 days   Kent Mayo L 04/01/2015, 8:12 AM

## 2015-04-01 NOTE — Evaluation (Signed)
Physical Therapy Evaluation Patient Details Name: Kent Mayo MRN: [id number] DOB: 1927/06/11 Today's Date: 04/01/2015   History of Present Illness  81 yo male admitted with AKI. Hx of CVA, DM, HTN, legall blind and very hard of hearing. Pt is from SNF  Clinical Impression  On eval;, pt required Mod assist +2 for mobility-walked ~50 feet with RW. Mod encouragement for pt participation.Recommend return to sNF.     Follow Up Recommendations SNF;Supervision/Assistance - 24 hour    Equipment Recommendations  None recommended by PT    Recommendations for Other Services       Precautions / Restrictions Precautions Precautions: Fall Precaution Comments: pt legally blind and HOH with bilat hearing aides Restrictions Weight Bearing Restrictions: No      Mobility  Bed Mobility Overal bed mobility: Needs Assistance Bed Mobility: Supine to Sit;Sit to Supine     Supine to sit: Mod assist;+2 for physical assistance;+2 for safety/equipment;HOB elevated Sit to supine: Mod assist   General bed mobility comments: cues for sequence and reaching for therapist's hand for assist. Assist for trunk and bil LEs.   Transfers Overall transfer level: Needs assistance Equipment used: Rolling walker (2 wheeled) Transfers: Sit to/from Stand Sit to Stand: Mod assist;From elevated surface         General transfer comment: Assist to rise, stabilize, control descent. Multimodal cues for safety, hand placement.   Ambulation/Gait Ambulation/Gait assistance: Mod assist;+2 safety/equipment   Assistive device: Rolling walker (2 wheeled) Gait Pattern/deviations: Step-through pattern;Decreased stride length     General Gait Details: LOB x 2 to L side while ambulating. Assist to stabilize pt and maneuver with RW. Verbal and external assist for walker guidance given due to imapired vision/HOH.   Stairs            Wheelchair Mobility    Modified Rankin (Stroke Patients Only)        Balance Overall balance assessment: Needs assistance         Standing balance support: Bilateral upper extremity supported;During functional activity Standing balance-Leahy Scale: Poor                               Pertinent Vitals/Pain Pain Assessment: Faces Faces Pain Scale: Hurts little more Pain Location: bottom Pain Descriptors / Indicators: Sore Pain Intervention(s): Repositioned;Monitored during session    Home Living Family/patient expects to be discharged to:: Skilled nursing facility                      Prior Function Level of Independence: Needs assistance               Hand Dominance        Extremity/Trunk Assessment   Upper Extremity Assessment: Generalized weakness           Lower Extremity Assessment: Generalized weakness      Cervical / Trunk Assessment: Normal  Communication   Communication: HOH  Cognition Arousal/Alertness: Awake/alert   Overall Cognitive Status: Difficult to assess                      General Comments      Exercises        Assessment/Plan    PT Assessment Patient needs continued PT services  PT Diagnosis Difficulty walking;Generalized weakness   PT Problem List Decreased strength;Decreased range of motion;Decreased activity tolerance;Decreased balance;Decreased mobility;Decreased knowledge of use of DME;Pain  PT Treatment Interventions DME  instruction;Gait training;Functional mobility training;Therapeutic activities;Patient/family education;Balance training;Therapeutic exercise   PT Goals (Current goals can be found in the Care Plan section) Acute Rehab PT Goals Patient Stated Goal: didn't state PT Goal Formulation: Patient unable to participate in goal setting Time For Goal Achievement: 04/15/15 Potential to Achieve Goals: Fair    Frequency Min 3X/week   Barriers to discharge        Co-evaluation               End of Session Equipment Utilized During  Treatment: Gait belt Activity Tolerance: Patient tolerated treatment well Patient left: in bed;with call bell/phone within reach;with bed alarm set           Time: 2585-2778 PT Time Calculation (min) (ACUTE ONLY): 20 min   Charges:   PT Evaluation $Initial PT Evaluation Tier I: 1 Procedure     PT G Codes:        Weston Anna, MPT Pager: [phone]

## 2015-04-01 NOTE — Progress Notes (Signed)
[facility] Progress Note   GAJE TENNYSON JSH:[id number] DOB: 12/16/1926 DOA: 03/29/2015 PCP: No primary care provider on file.  Brief narrative: Kent Mayo is a 81 y.o. male with diabetes mellitus, hypertension, dyslipidemia and glaucoma who has an indwelling Foley catheter. He was sent from the nursing facility for altered mental status and was found to have acute renal failure and hyperkalemia on blood work.  The patient was admitted to the hospital last month with acute renal failure, confusion and urinary retention. He required a Foley catheter. Renal function and confusion resolved and he was discharged on 8/19 with Foley in place and follow up with urology. In the ER was noted that his bladder was severely distended.There was a small amount of bloody urine and clots in the Foley bag.  Foley could not be flushed. The Foley was changed by urology and greater than 1 L of clear amber colored urine without clots drained.  Subjective: Continues to have no complaints. Specifically no suprapubic pain, no nausea vomiting or diarrhea.  Assessment/Plan: Principal Problem:   AKI (acute kidney injury)/metabolic acidosis - Due to obstructed Foley which has resolved after catheter change -Renal Function continues to improve rapidly -Continue to match I and O  Active Problems: Hyperkalemia - Potassium normalized    Urinary retention/BPH - Foley catheter was initially placed for this reason-urology placed a 20 [place] [employer] catheter over a wire on admission and has recommended that the catheter be continued for at least 1 more week prior to giving a voiding trial due to stretch injury and traumatic Foley insertion -Resumed Proscar  -When necessary B and O suppositories for bladder spasm  Pedal edema -Possibly secondary to acute renal failure-has resolved as of 8/28    Acute encephalopathy -Due to urinary retention and acute renal failure-resolved by 8/27 afternoon-placed on  solid food and resumed oral medication    UTI? - He has leukocytosis which may be related to UTI-UA is grossly positive for WBCs but this is in setting of a chronic Foley -Started Rocephin on admission- culture negative for Rocephin discontinued-urology states that the patient should be continued on antibiotics for 10 more days and recommends Vantin which I have started today  Hematuria -Urine was bloody in the ER and -continue to be bloody-management per urology-have spoken with Dr. Noah Delaine today-Foley has been flushed with large amount of clots removed-Foley placed in traction by urology to help tamponade bleeding if it's coming from the prostate    HTN (hypertension) -Held verapamil and Coreg while lethargic and given IV Lopressor -Oral medications resumed 8/27    Diabetes mellitus -Low-dose sliding scale with meals -sugars stable  Glaucoma- -Continue Alphagan and Cosopt   Code Status:     Code Status Orders        Start     Ordered   03/29/15 1744  Full code   Continuous     03/29/15 1745     Family Communication: Brother Rolan Bucco who is power of attorney Disposition Plan: Return to SNF on discharge-hopefully tomorrow DVT prophylaxis: SCDs Consultants: Urology, nephrology Procedures: Foley catheter placement 8/26  Antibiotics: Anti-infectives    Start     Dose/Rate Route Frequency Ordered Stop   04/01/15 1230  cefpodoxime (VANTIN) tablet 200 mg     200 mg Oral Every 12 hours 04/01/15 1124     03/30/15 1800  cefTRIAXone (ROCEPHIN) 1 g in dextrose 5 % 50 mL IVPB  Status:  Discontinued     1 g 100 mL/hr over  30 Minutes Intravenous Every 24 hours 03/29/15 1748 03/31/15 0721   03/29/15 1800  cefTRIAXone (ROCEPHIN) 1 g in dextrose 5 % 50 mL IVPB  Status:  Discontinued     1 g 100 mL/hr over 30 Minutes Intravenous Every 24 hours 03/29/15 1747 03/29/15 1748   03/29/15 1715  cefTRIAXone (ROCEPHIN) 1 g in dextrose 5 % 50 mL IVPB     1 g 100 mL/hr over 30 Minutes  Intravenous  Once 03/29/15 1713 03/29/15 1752      Objective: Filed Weights   03/29/15 2200 03/31/15 0509 04/01/15 0544  Weight: 103.1 kg (227 lb 4.7 oz) 102.1 kg (225 lb 1.4 oz) 103.4 kg (227 lb 15.3 oz)    Intake/Output Summary (Last 24 hours) at 04/01/15 1232 Last data filed at 04/01/15 1100  Gross per 24 hour  Intake   3420 ml  Output   2651 ml  Net    769 ml     Vitals Filed Vitals:   03/31/15 1513 03/31/15 2205 04/01/15 0544 04/01/15 0900  BP:  152/62 155/64 172/62  Pulse: 72 69 62 64  Temp:  97.8 F (36.6 C) 97.6 F (36.4 C) 97.6 F (36.4 C)  TempSrc:  Oral Oral Oral  Resp:  18 18 18   Height:      Weight:   103.4 kg (227 lb 15.3 oz)   SpO2: 98% 94% 97% 97%    Exam:  General:  Pt is awake alert oriented 3-appears quite comfortable  HEENT: Cataract in left eye-No icterus, No thrush, oral mucosa moist  Cardiovascular: regular rate and rhythm, S1/S2 No murmur  Respiratory: clear to auscultation bilaterally   Abdomen: Soft, +Bowel sounds, non tender, non distended, no guarding  MSK: No cyanosis or clubbing- 2+ LE edema  Data Reviewed: Basic Metabolic Panel:  Recent Labs Lab 03/29/15 1611 03/29/15 2100 03/30/15 0515 03/31/15 0530 04/01/15 0510  NA 134* 134* 139 144 143  K 5.7* 5.4* 4.5 3.8 3.9  CL 102 104 107 103 104  CO2 16* 15* 20* 31 31  GLUCOSE 229* 189* 139* 114* 129*  BUN 118* 105* 99* 75* 46*  CREATININE 8.13* 7.06* 5.68* 3.21* 2.05*  CALCIUM 9.1 8.8* 8.6* 8.4* 8.3*  PHOS  --   --  5.7*  --   --    Liver Function Tests:  Recent Labs Lab 03/29/15 1611 03/30/15 0515  AST 36  --   ALT 14*  --   ALKPHOS 102  --   BILITOT 0.3  --   PROT 7.3  --   ALBUMIN 3.1* 2.6*   No results for input(s): LIPASE, AMYLASE in the last 168 hours.  Recent Labs Lab 03/29/15 1612  AMMONIA 32   CBC:  Recent Labs Lab 03/29/15 1611 03/30/15 0515 03/31/15 0530 04/01/15 0510  WBC 18.8* 16.4* 14.6* 14.7*  NEUTROABS 15.5*  --   --   --   HGB  12.7* 11.2* 12.1* 10.8*  HCT 36.4* 32.4* 35.5* 32.7*  MCV 86.7 86.6 89.9 91.6  PLT 257 227 216 236   Cardiac Enzymes:  Recent Labs Lab 03/29/15 1611  CKTOTAL 107   BNP (last 3 results) No results for input(s): BNP in the last 8760 hours.  ProBNP (last 3 results) No results for input(s): PROBNP in the last 8760 hours.  CBG:  Recent Labs Lab 03/31/15 1104 03/31/15 1657 03/31/15 2209 04/01/15 0751 04/01/15 1208  GLUCAP 127* 123* 125* 115* 134*    Recent Results (from the past 240 hour(s))  Urine  culture     Status: None   Collection Time: 03/29/15  6:52 PM  Result Value Ref Range Status   Specimen Description URINE, CLEAN CATCH  Final   Special Requests NONE  Final   Culture   Final    9,000 COLONIES/mL INSIGNIFICANT GROWTH Performed at [facility]    Report Status 03/31/2015 FINAL  Final  Culture, blood (routine x 2)     Status: None (Preliminary result)   Collection Time: 03/29/15  8:55 PM  Result Value Ref Range Status   Specimen Description BLOOD RIGHT ANTECUBITAL  Final   Special Requests BOTTLES DRAWN AEROBIC AND ANAEROBIC 10ML  Final   Culture   Final    NO GROWTH 3 DAYS Performed at [facility]    Report Status PENDING  Incomplete  Culture, blood (routine x 2)     Status: None (Preliminary result)   Collection Time: 03/29/15  9:00 PM  Result Value Ref Range Status   Specimen Description BLOOD RIGHT HAND  Final   Special Requests PEDIATRICS 2ML  Final   Culture   Final    NO GROWTH 3 DAYS Performed at [facility]    Report Status PENDING  Incomplete     Studies: No results found.  Scheduled Meds:  Scheduled Meds: . antiseptic oral rinse  7 mL Mouth Rinse q12n4p  . brimonidine  1 drop Both Eyes TID  . carvedilol  6.25 mg Oral BID  . cefpodoxime  200 mg Oral Q12H  . chlorhexidine  15 mL Mouth Rinse BID  . dorzolamide-timolol  1 drop Right Eye BID  . feeding supplement (ENSURE ENLIVE)  237 mL Oral BID BM  .  finasteride  5 mg Oral Daily  . Influenza vac split quadrivalent PF  0.5 mL Intramuscular Tomorrow-1000  . insulin aspart  0-9 Units Subcutaneous TID WC  . verapamil  240 mg Oral QHS   Continuous Infusions: . sodium chloride 100 mL/hr at 04/01/15 0343    Time spent on care of this patient: 35 min   [place], MD 04/01/2015, 12:32 PM  LOS: 3 days   [facility] Office  [phone] Pager - Text Page per [email or web address] If 7PM-7AM, please contact night-coverage [email or web address]

## 2015-04-02 DIAGNOSIS — R197 Diarrhea, unspecified: Secondary | ICD-10-CM

## 2015-04-02 LAB — BASIC METABOLIC PANEL
ANION GAP: 6 (ref 5–15)
BUN: 31 mg/dL — AB (ref 6–20)
CALCIUM: 8.4 mg/dL — AB (ref 8.9–10.3)
CO2: 30 mmol/L (ref 22–32)
Chloride: 107 mmol/L (ref 101–111)
Creatinine, Ser: 1.61 mg/dL — ABNORMAL HIGH (ref 0.61–1.24)
GFR calc Af Amer: 43 mL/min — ABNORMAL LOW (ref >60)
GFR, EST NON AFRICAN AMERICAN: 37 mL/min — AB (ref >60)
GLUCOSE: 131 mg/dL — AB (ref 65–99)
Potassium: 3.9 mmol/L (ref 3.5–5.1)
SODIUM: 143 mmol/L (ref 135–145)

## 2015-04-02 LAB — CBC
HCT: 33.8 % — ABNORMAL LOW (ref 39.0–52.0)
Hemoglobin: 11.1 g/dL — ABNORMAL LOW (ref 13.0–17.0)
MCH: 30.2 pg (ref 26.0–34.0)
MCHC: 32.8 g/dL (ref 30.0–36.0)
MCV: 91.8 fL (ref 78.0–100.0)
Platelets: 256 10*3/uL (ref 150–400)
RBC: 3.68 MIL/uL — ABNORMAL LOW (ref 4.22–5.81)
RDW: 13.9 % (ref 11.5–15.5)
WBC: 15.1 10*3/uL — AB (ref 4.0–10.5)

## 2015-04-02 LAB — GLUCOSE, CAPILLARY
GLUCOSE-CAPILLARY: 105 mg/dL — AB (ref 65–99)
Glucose-Capillary: 134 mg/dL — ABNORMAL HIGH (ref 65–99)
Glucose-Capillary: 109 mg/dL — ABNORMAL HIGH (ref 65–99)

## 2015-04-02 NOTE — Care Management Note (Signed)
Case Management Note  Patient Details  Name: Kent Mayo MRN: [id number] Date of Birth: 12-Aug-1926  Subjective/Objective:       81 yo admitted with AKI             Action/Plan: From [facility] and plans to return there at DC  Expected Discharge Date:                  Expected Discharge Plan:  [place]  In-House Referral:  Clinical Social Work  Discharge planning Services  CM Consult  Post Acute Care Choice:    Choice offered to:     DME Arranged:    DME Agency:     HH Arranged:    [place] Agency:     Status of Service:  Completed, signed off  Medicare Important Message Given:  Yes-second notification given Date Medicare IM Given:    Medicare IM give by:    Date Additional Medicare IM Given:    Additional Medicare Important Message give by:     If discussed at [place] of Stay Meetings, dates discussed:    Additional Comments:  Lynnell Catalan, RN 04/02/2015, 11:51 AM

## 2015-04-02 NOTE — Progress Notes (Signed)
  Subjective: Patient reports no pain. Creatinine has improved to1.6. Urine is light ink this morning. Pt on finasteride for 4 days Objective: Vital signs in last 24 hours: Temp:  [97.5 F (36.4 C)-97.8 F (36.6 C)] 97.5 F (36.4 C) (08/30 0504) Pulse Rate:  [64-66] 64 (08/30 0504) Resp:  [16-18] 16 (08/30 0504) BP: (151-164)/(59-72) 151/59 mmHg (08/30 0504) SpO2:  [97 %] 97 % (08/30 0504)  Intake/Output from previous day: 08/29 0701 - 08/30 0700 In: 4220 [P.O.:1420; I.V.:2300] Out: 3100 [Urine:3100] Intake/Output this shift:    Physical Exam:  General:alert and appears stated age GI: soft, non tender, normal bowel sounds, no palpable masses, no organomegaly, no inguinal hernia Male genitalia: no penile lesions or discharge no testicular masses Resp: clear to auscultation bilaterally Extremities: extremities normal, atraumatic, no cyanosis or edema  Lab Results:  Recent Labs  03/31/15 0530 04/01/15 0510 04/02/15 0717  HGB 12.1* 10.8* 11.1*  HCT 35.5* 32.7* 33.8*   BMET  Recent Labs  04/01/15 0510 04/02/15 0717  NA 143 143  K 3.9 3.9  CL 104 107  CO2 31 30  GLUCOSE 129* 131*  BUN 46* 31*  CREATININE 2.05* 1.61*  CALCIUM 8.3* 8.4*   No results for input(s): LABPT, INR in the last 72 hours. No results for input(s): LABURIN in the last 72 hours. Results for orders placed or performed during the hospital encounter of 03/29/15  Urine culture     Status: None   Collection Time: 03/29/15  6:52 PM  Result Value Ref Range Status   Specimen Description URINE, CLEAN CATCH  Final   Special Requests NONE  Final   Culture   Final    9,000 COLONIES/mL INSIGNIFICANT GROWTH Performed at [facility]    Report Status 03/31/2015 FINAL  Final  Culture, blood (routine x 2)     Status: None (Preliminary result)   Collection Time: 03/29/15  8:55 PM  Result Value Ref Range Status   Specimen Description BLOOD RIGHT ANTECUBITAL  Final   Special Requests BOTTLES  DRAWN AEROBIC AND ANAEROBIC 10ML  Final   Culture   Final    NO GROWTH 3 DAYS Performed at [facility]    Report Status PENDING  Incomplete  Culture, blood (routine x 2)     Status: None (Preliminary result)   Collection Time: 03/29/15  9:00 PM  Result Value Ref Range Status   Specimen Description BLOOD RIGHT HAND  Final   Special Requests PEDIATRICS 2ML  Final   Culture   Final    NO GROWTH 3 DAYS Performed at [facility]    Report Status PENDING  Incomplete    Studies/Results: No results found.  Assessment/Plan: 81yo with urinary retention, ARF, and gross hematuria  Recs: 1. Continue foley to straight drain  2. Continue finasteride 3. Vantin 200mg  BID for 7 days 4. Urology to continue to follow  LOS: 4 days   Kent Mayo 04/02/2015, 9:52 AM

## 2015-04-02 NOTE — Progress Notes (Addendum)
[facility] Progress Note   Kent Mayo NGE:[id number] DOB: 02-19-1927 DOA: 03/29/2015 PCP: No primary care provider on file.  Brief narrative: Kent Mayo is a 81 y.o. male with diabetes mellitus, hypertension, dyslipidemia and glaucoma who has an indwelling Foley catheter. He was sent from the nursing facility for altered mental status and was found to have acute renal failure and hyperkalemia on blood work.  The patient was admitted to the hospital last month with acute renal failure, confusion and urinary retention. He required a Foley catheter. Renal function and confusion resolved and he was discharged on 8/19 with Foley in place and follow up with urology. In the ER was noted that his bladder was severely distended.There was a small amount of bloody urine and clots in the Foley bag.  Foley could not be flushed. The Foley was changed by urology and greater than 1 L of clear amber colored urine without clots drained.  Subjective: Per RN, he has been having loose stools since yesterday. The patient has no complaints of nausea vomiting abdominal pain and actually does not complain of diarrhea either.  Assessment/Plan: Principal Problem:   AKI (acute kidney injury)/metabolic acidosis - Due to obstructed Foley which has resolved after catheter change -Renal Function continues to improve rapidly -Continue to match I and O  Active Problems: Hyperkalemia - Potassium normalized  Leukocytosis/diarrhea -WBC has risen since yesterday along with new development of loose stools-checking for C. difficile colitis    Urinary retention/BPH -Was retaining over a liter of urine on admission- Foley catheter was initially placed for this reason and clearly became obstructed while at the skilled nursing facility-urology placed a new [place] catheter over a wire on admission and has recommended that the catheter be continued for at least 1 more week from insertion prior to  giving a voiding trial due to stretch injury and traumatic Foley insertion -Resumed Proscar  -When necessary B and O suppositories for bladder spasm    UTI? - He has leukocytosis which may be related to UTI-UA is grossly positive for WBCs but this is in setting of a chronic Foley -Started Rocephin on admission- culture negative and therefore Rocephin discontinued on 8/27 -8/29-Dr. Noah Delaine states that the patient should be continued on antibiotics and recommends Vantin which I restarted on 8/29-he states today that it should be continued for 7 more days  Hematuria -Urine was bloody in the ER and continue to be bloody-management per urology -8/29-spoke with Dr. Serena Colonel flushed by him with large amount of clots removed-Foley placed in traction to help tamponade bleeding if it's coming from the prostate- urine is still blood-tinged but not is severe as yesterday  Pedal edema -Possibly secondary to acute renal failure-has resolved as of 8/28    Acute encephalopathy -Due to urinary retention and acute renal failure-resolved by 8/27 afternoon-placed on solid food and resumed oral medication      HTN (hypertension) -Held verapamil and Coreg while lethargic and given IV Lopressor -Oral medications resumed 8/27    Diabetes mellitus -Low-dose sliding scale with meals -sugars stable  Glaucoma-partially blind -Continue Alphagan and Cosopt   Code Status:     Code Status Orders        Start     Ordered   03/29/15 1744  Full code   Continuous     03/29/15 1745     Family Communication: Brother Kent Mayo who is power of attorney Disposition Plan: Return to SNF on discharge-hopefully tomorrow DVT prophylaxis: SCDs Consultants: Urology,  nephrology Procedures: Foley catheter placement 8/26  Antibiotics: Anti-infectives    Start     Dose/Rate Route Frequency Ordered Stop   04/01/15 1230  cefpodoxime (VANTIN) tablet 200 mg     200 mg Oral Every 12 hours 04/01/15 1124      03/30/15 1800  cefTRIAXone (ROCEPHIN) 1 g in dextrose 5 % 50 mL IVPB  Status:  Discontinued     1 g 100 mL/hr over 30 Minutes Intravenous Every 24 hours 03/29/15 1748 03/31/15 0721   03/29/15 1800  cefTRIAXone (ROCEPHIN) 1 g in dextrose 5 % 50 mL IVPB  Status:  Discontinued     1 g 100 mL/hr over 30 Minutes Intravenous Every 24 hours 03/29/15 1747 03/29/15 1748   03/29/15 1715  cefTRIAXone (ROCEPHIN) 1 g in dextrose 5 % 50 mL IVPB     1 g 100 mL/hr over 30 Minutes Intravenous  Once 03/29/15 1713 03/29/15 1752      Objective: Filed Weights   03/29/15 2200 03/31/15 0509 04/01/15 0544  Weight: 103.1 kg (227 lb 4.7 oz) 102.1 kg (225 lb 1.4 oz) 103.4 kg (227 lb 15.3 oz)    Intake/Output Summary (Last 24 hours) at 04/02/15 1408 Last data filed at 04/02/15 1205  Gross per 24 hour  Intake   1990 ml  Output   2200 ml  Net   -210 ml     Vitals Filed Vitals:   04/01/15 0544 04/01/15 0900 04/01/15 2127 04/02/15 0504  BP: 155/64 172/62 164/72 151/59  Pulse: 62 64 66 64  Temp: 97.6 F (36.4 C) 97.6 F (36.4 C) 97.8 F (36.6 C) 97.5 F (36.4 C)  TempSrc: Oral Oral Oral Oral  Resp: 18 18 18 16   Height:      Weight: 103.4 kg (227 lb 15.3 oz)     SpO2: 97% 97% 97% 97%    Exam:  General:  Pt is awake alert oriented 3-appears quite comfortable  HEENT: Cataract in left eye-No icterus, No thrush, oral mucosa moist  Cardiovascular: regular rate and rhythm, S1/S2 No murmur  Respiratory: clear to auscultation bilaterally   Abdomen: Soft, +Bowel sounds, non tender, non distended, no guarding  MSK: No cyanosis or clubbing-  no LE edema  Data Reviewed: Basic Metabolic Panel:  Recent Labs Lab 03/29/15 2100 03/30/15 0515 03/31/15 0530 04/01/15 0510 04/02/15 0717  NA 134* 139 144 143 143  K 5.4* 4.5 3.8 3.9 3.9  CL 104 107 103 104 107  CO2 15* 20* 31 31 30   GLUCOSE 189* 139* 114* 129* 131*  BUN 105* 99* 75* 46* 31*  CREATININE 7.06* 5.68* 3.21* 2.05* 1.61*  CALCIUM 8.8*  8.6* 8.4* 8.3* 8.4*  PHOS  --  5.7*  --   --   --    Liver Function Tests:  Recent Labs Lab 03/29/15 1611 03/30/15 0515  AST 36  --   ALT 14*  --   ALKPHOS 102  --   BILITOT 0.3  --   PROT 7.3  --   ALBUMIN 3.1* 2.6*   No results for input(s): LIPASE, AMYLASE in the last 168 hours.  Recent Labs Lab 03/29/15 1612  AMMONIA 32   CBC:  Recent Labs Lab 03/29/15 1611 03/30/15 0515 03/31/15 0530 04/01/15 0510 04/02/15 0717  WBC 18.8* 16.4* 14.6* 14.7* 15.1*  NEUTROABS 15.5*  --   --   --   --   HGB 12.7* 11.2* 12.1* 10.8* 11.1*  HCT 36.4* 32.4* 35.5* 32.7* 33.8*  MCV 86.7 86.6 89.9  91.6 91.8  PLT 257 227 216 236 256   Cardiac Enzymes:  Recent Labs Lab 03/29/15 1611  CKTOTAL 107   BNP (last 3 results) No results for input(s): BNP in the last 8760 hours.  ProBNP (last 3 results) No results for input(s): PROBNP in the last 8760 hours.  CBG:  Recent Labs Lab 04/01/15 1208 04/01/15 1705 04/01/15 2123 04/02/15 0754 04/02/15 1145  GLUCAP 134* 153* 109* 105* 134*    Recent Results (from the past 240 hour(s))  Urine culture     Status: None   Collection Time: 03/29/15  6:52 PM  Result Value Ref Range Status   Specimen Description URINE, CLEAN CATCH  Final   Special Requests NONE  Final   Culture   Final    9,000 COLONIES/mL INSIGNIFICANT GROWTH Performed at [facility]    Report Status 03/31/2015 FINAL  Final  Culture, blood (routine x 2)     Status: None (Preliminary result)   Collection Time: 03/29/15  8:55 PM  Result Value Ref Range Status   Specimen Description BLOOD RIGHT ANTECUBITAL  Final   Special Requests BOTTLES DRAWN AEROBIC AND ANAEROBIC 10ML  Final   Culture   Final    NO GROWTH 4 DAYS Performed at [facility]    Report Status PENDING  Incomplete  Culture, blood (routine x 2)     Status: None (Preliminary result)   Collection Time: 03/29/15  9:00 PM  Result Value Ref Range Status   Specimen Description BLOOD RIGHT  HAND  Final   Special Requests PEDIATRICS 2ML  Final   Culture   Final    NO GROWTH 4 DAYS Performed at [facility]    Report Status PENDING  Incomplete     Studies: No results found.  Scheduled Meds:  Scheduled Meds: . antiseptic oral rinse  7 mL Mouth Rinse q12n4p  . brimonidine  1 drop Both Eyes TID  . carvedilol  6.25 mg Oral BID  . cefpodoxime  200 mg Oral Q12H  . chlorhexidine  15 mL Mouth Rinse BID  . dorzolamide-timolol  1 drop Right Eye BID  . feeding supplement (ENSURE ENLIVE)  237 mL Oral BID BM  . finasteride  5 mg Oral Daily  . Influenza vac split quadrivalent PF  0.5 mL Intramuscular Tomorrow-1000  . insulin aspart  0-9 Units Subcutaneous TID WC  . verapamil  240 mg Oral QHS   Continuous Infusions: . sodium chloride 100 mL/hr at 04/02/15 0827    Time spent on care of this patient: 75 min   [place], MD 04/02/2015, 2:08 PM  LOS: 4 days   [facility] Office  [phone] Pager - Text Page per [email or web address] If 7PM-7AM, please contact night-coverage [email or web address]

## 2015-04-02 NOTE — Progress Notes (Signed)
Clinical Social Work  Per MD, patient might be ready to DC this afternoon. CSW spoke with SNF who is agreeable to accept patient whenever medically stable.  [place], [place] [phone]

## 2015-04-03 DIAGNOSIS — N179 Acute kidney failure, unspecified: Secondary | ICD-10-CM

## 2015-04-03 DIAGNOSIS — N390 Urinary tract infection, site not specified: Secondary | ICD-10-CM

## 2015-04-03 DIAGNOSIS — I10 Essential (primary) hypertension: Secondary | ICD-10-CM

## 2015-04-03 DIAGNOSIS — G9340 Encephalopathy, unspecified: Secondary | ICD-10-CM

## 2015-04-03 DIAGNOSIS — R319 Hematuria, unspecified: Secondary | ICD-10-CM

## 2015-04-03 LAB — BASIC METABOLIC PANEL
Anion gap: 8 (ref 5–15)
BUN: 22 mg/dL — AB (ref 6–20)
CALCIUM: 8.3 mg/dL — AB (ref 8.9–10.3)
CHLORIDE: 107 mmol/L (ref 101–111)
CO2: 27 mmol/L (ref 22–32)
CREATININE: 1.46 mg/dL — AB (ref 0.61–1.24)
GFR calc Af Amer: 48 mL/min — ABNORMAL LOW (ref >60)
GFR calc non Af Amer: 42 mL/min — ABNORMAL LOW (ref >60)
Glucose, Bld: 95 mg/dL (ref 65–99)
Potassium: 4.5 mmol/L (ref 3.5–5.1)
Sodium: 142 mmol/L (ref 135–145)

## 2015-04-03 LAB — GLUCOSE, CAPILLARY
GLUCOSE-CAPILLARY: 116 mg/dL — AB (ref 65–99)
Glucose-Capillary: 90 mg/dL (ref 65–99)
Glucose-Capillary: 164 mg/dL — ABNORMAL HIGH (ref 65–99)
Glucose-Capillary: 99 mg/dL (ref 65–99)
Glucose-Capillary: 109 mg/dL — ABNORMAL HIGH (ref 65–99)

## 2015-04-03 LAB — CBC
HCT: 34.7 % — ABNORMAL LOW (ref 39.0–52.0)
Hemoglobin: 11.4 g/dL — ABNORMAL LOW (ref 13.0–17.0)
MCH: 30.2 pg (ref 26.0–34.0)
MCHC: 32.9 g/dL (ref 30.0–36.0)
MCV: 91.8 fL (ref 78.0–100.0)
PLATELETS: 221 10*3/uL (ref 150–400)
RBC: 3.78 MIL/uL — ABNORMAL LOW (ref 4.22–5.81)
RDW: 14 % (ref 11.5–15.5)
WBC: 17.2 10*3/uL — ABNORMAL HIGH (ref 4.0–10.5)

## 2015-04-03 LAB — CULTURE, BLOOD (ROUTINE X 2)
CULTURE: NO GROWTH
Culture: NO GROWTH

## 2015-04-03 MED ORDER — DEXTROSE 5 % IV SOLN
1.0000 g | INTRAVENOUS | Status: DC
  Administered 2015-04-03 – 2015-04-04 (×2): 1 g via INTRAVENOUS
  Filled 2015-04-03 (×3): qty 10

## 2015-04-03 NOTE — Progress Notes (Signed)
  Subjective: Patient reports no pain. ARF resolved with foley drainage. WBC count increasing today on vantin. Pt also having loose stools. 10cc of clot irrigated from the bladder. Previous culture grew 9K colonies insufficient for ID  Objective: Vital signs in last 24 hours: Temp:  [97.5 F (36.4 C)-98.1 F (36.7 C)] 98.1 F (36.7 C) (08/31 0614) Pulse Rate:  [62-65] 62 (08/31 0614) Resp:  [16-20] 20 (08/31 0614) BP: (129-161)/(61-84) 161/74 mmHg (08/31 0614) SpO2:  [97 %-100 %] 100 % (08/31 0614)  Intake/Output from previous day: 08/30 0701 - 08/31 0700 In: 2493.3 [I.V.:2493.3] Out: 1000 [Urine:1000] Intake/Output this shift:    Physical Exam:  General:alert and appears stated age GI: soft, non tender, normal bowel sounds, no palpable masses, no organomegaly, no inguinal hernia Male genitalia: no penile lesions or discharge no testicular masses Resp: clear to auscultation bilaterally Extremities: extremities normal, atraumatic, no cyanosis or edema  Lab Results:  Recent Labs  04/01/15 0510 04/02/15 0717 04/03/15 0625  HGB 10.8* 11.1* 11.4*  HCT 32.7* 33.8* 34.7*   BMET  Recent Labs  04/02/15 0717 04/03/15 0625  NA 143 142  K 3.9 4.5  CL 107 107  CO2 30 27  GLUCOSE 131* 95  BUN 31* 22*  CREATININE 1.61* 1.46*  CALCIUM 8.4* 8.3*   No results for input(s): LABPT, INR in the last 72 hours. No results for input(s): LABURIN in the last 72 hours. Results for orders placed or performed during the hospital encounter of 03/29/15  Urine culture     Status: None   Collection Time: 03/29/15  6:52 PM  Result Value Ref Range Status   Specimen Description URINE, CLEAN CATCH  Final   Special Requests NONE  Final   Culture   Final    9,000 COLONIES/mL INSIGNIFICANT GROWTH Performed at [facility]    Report Status 03/31/2015 FINAL  Final  Culture, blood (routine x 2)     Status: None (Preliminary result)   Collection Time: 03/29/15  8:55 PM  Result Value  Ref Range Status   Specimen Description BLOOD RIGHT ANTECUBITAL  Final   Special Requests BOTTLES DRAWN AEROBIC AND ANAEROBIC 10ML  Final   Culture   Final    NO GROWTH 4 DAYS Performed at [facility]    Report Status PENDING  Incomplete  Culture, blood (routine x 2)     Status: None (Preliminary result)   Collection Time: 03/29/15  9:00 PM  Result Value Ref Range Status   Specimen Description BLOOD RIGHT HAND  Final   Special Requests PEDIATRICS 2ML  Final   Culture   Final    NO GROWTH 4 DAYS Performed at [facility]    Report Status PENDING  Incomplete    Studies/Results: No results found.  Assessment/Plan: 81yo with urinary retention, ARF (resolved), and gross hematuria  Recs: 1. Continue foley to straight drain  2. Continue finasteride 3. Urine for culture. If C diff is negative pt may benefit from broader spectum antibiotics for presumed UTI  4. Urology to continue to follow  LOS: 5 days   Geronimo Diliberto L 04/03/2015, 8:31 AM

## 2015-04-03 NOTE — Progress Notes (Addendum)
Patient ID: Kent Mayo, male   DOB: 05/22/1927, 81 y.o.   MRN: [id number] [facility] PROGRESS NOTE  Kent Mayo HAL:[id number] DOB: Dec 16, 1926 DOA: 03/29/2015 PCP: No primary care provider on file. Pt is from SNF. Cardiology: Dr. Jenkins Rouge   Brief narrative:    75 -year-old male with past medical history of hypertension, diabetes, dyslipidemia, recent hospitalization from 03/18/2015 through 03/22/2015 for acute renal failure thought to be secondary to urinary retention. He had a Foley catheter placed and was subsequently discharged to nursing home. Patient presented this time with altered mental status. He was again found to have acute renal failure with hyperkalemia. On the admission, patient was found to have severely distended bladder with small amount of blood and clots in the Foley. Urology has seen the patient in consultation. Patient was placed on anti-biotics for urinary tract infection.  Anticipated discharge: Once white blood cell count improves. We will change anti-biotics from Vantin to Rocephin because of worsening WBC count this morning.   Assessment/Plan:    Principal Problem: Acute encephalopathy - Likely related to combination of UTI and urinary retention. - Mental status better this morning. - Continue Foley catheter as recommended by urology. - Physical therapy has seen the patient in consultation and recommended the patient returns to skilled nursing facility once stable for discharge.  Active problems Acute renal failure / obstructive uropathy - Acute renal failure likely related to urinary retention from obstructive uropathy - Continue Foley catheter - Monitor intake and output - Creatinine has improved over past 2 days from 2.05 down to 1.46   Hyperkalemia - Likely from acute renal failure  - Potassium normalized. Potassium is 4.5 this morning.   Urinary tract infection secondary to indwelling foley catheter / leukocytosis - Urinalysis on  admission with large leukocytes and many bacteria - Patient was on cefpodoxime but because of worsening leukocytosis we will switch to Rocephin.  Diarrhea  - Will follow up on stool for C. difficile   Hematuria - Evidence of gross blood in Foley catheter on admission. - Stabilized. Hemoglobin is stable at 11.4  Essential hypertension - Continue carvedilol 6.25 mg twice daily and verapamil 240 mg at bedtime  Diabetes mellitus with renal manifestations - We'll continue sliding scale insulin for now  BPH - Continue finasteride  Mild protein calorie malnutrition - In the context of acute illness. - Continue nutritional supplementation.     DVT Prophylaxis  - SCD's bilaterally    Code Status: Full.  Family Communication:  plan of care discussed with the patient Disposition Plan: to SNF once we rule out C.diff and if WBC count trends down.   IV access:  Peripheral IV  Procedures and diagnostic studies:    No results found.  Medical Consultants:  Urology  Other Consultants:  Physical therapy  IAnti-Infectives:   Zosyn; change to rocephin starting 04/03/2015 -->    Leisa Lenz, MD  [facility] Pager [phone]  Time spent in minutes: 25 minutes  If 7PM-7AM, please contact night-coverage [email or web address] Password [email or web address] 04/03/2015, 11:12 AM   LOS: 5 days    HPI/Subjective: No acute overnight events. Patient reports feeling better this am.   Objective: Filed Vitals:   04/02/15 1548 04/02/15 2130 04/03/15 0614 04/03/15 0900  BP: 129/84 146/61 161/74 146/56  Pulse: 62 65 62 62  Temp: 97.5 F (36.4 C) 97.5 F (36.4 C) 98.1 F (36.7 C)   TempSrc: Oral Oral Oral   Resp: 16 16 20    Height:  Weight:      SpO2: 97% 99% 100% 100%    Intake/Output Summary (Last 24 hours) at 04/03/15 1112 Last data filed at 04/03/15 0840  Gross per 24 hour  Intake 3416.66 ml  Output   1600 ml  Net 1816.66 ml    Exam:   General:  Pt is alert, follows commands  appropriately, not in acute distress  Cardiovascular: Regular rate and rhythm, S1/S2 appreciated   Respiratory: Clear to auscultation bilaterally, no wheezing, no crackles, no rhonchi  Abdomen: Soft, non tender, non distended, bowel sounds present  Extremities: No edema, pulses DP and PT palpable bilaterally  Neuro: Grossly nonfocal  Data Reviewed: Basic Metabolic Panel:  Recent Labs Lab 03/30/15 0515 03/31/15 0530 04/01/15 0510 04/02/15 0717 04/03/15 0625  NA 139 144 143 143 142  K 4.5 3.8 3.9 3.9 4.5  CL 107 103 104 107 107  CO2 20* 31 31 30 27   GLUCOSE 139* 114* 129* 131* 95  BUN 99* 75* 46* 31* 22*  CREATININE 5.68* 3.21* 2.05* 1.61* 1.46*  CALCIUM 8.6* 8.4* 8.3* 8.4* 8.3*  PHOS 5.7*  --   --   --   --    Liver Function Tests:  Recent Labs Lab 03/29/15 1611 03/30/15 0515  AST 36  --   ALT 14*  --   ALKPHOS 102  --   BILITOT 0.3  --   PROT 7.3  --   ALBUMIN 3.1* 2.6*   No results for input(s): LIPASE, AMYLASE in the last 168 hours.  Recent Labs Lab 03/29/15 1612  AMMONIA 32   CBC:  Recent Labs Lab 03/29/15 1611 03/30/15 0515 03/31/15 0530 04/01/15 0510 04/02/15 0717 04/03/15 0625  WBC 18.8* 16.4* 14.6* 14.7* 15.1* 17.2*  NEUTROABS 15.5*  --   --   --   --   --   HGB 12.7* 11.2* 12.1* 10.8* 11.1* 11.4*  HCT 36.4* 32.4* 35.5* 32.7* 33.8* 34.7*  MCV 86.7 86.6 89.9 91.6 91.8 91.8  PLT 257 227 216 236 256 221   Cardiac Enzymes:  Recent Labs Lab 03/29/15 1611  CKTOTAL 107   BNP: Invalid input(s): POCBNP CBG:  Recent Labs Lab 04/02/15 0754 04/02/15 1145 04/02/15 1648 04/02/15 2132 04/03/15 0728  GLUCAP 105* 134* 109* 116* 90    Recent Results (from the past 240 hour(s))  Urine culture     Status: None   Collection Time: 03/29/15  6:52 PM  Result Value Ref Range Status   Specimen Description URINE, CLEAN CATCH  Final   Special Requests NONE  Final   Culture   Final    9,000 COLONIES/mL INSIGNIFICANT GROWTH Performed at [facility]    Report Status 03/31/2015 FINAL  Final  Culture, blood (routine x 2)     Status: None   Collection Time: 03/29/15  8:55 PM  Result Value Ref Range Status   Specimen Description BLOOD RIGHT ANTECUBITAL  Final   Special Requests BOTTLES DRAWN AEROBIC AND ANAEROBIC 10ML  Final   Culture   Final    NO GROWTH 5 DAYS Performed at [facility]    Report Status 04/03/2015 FINAL  Final  Culture, blood (routine x 2)     Status: None   Collection Time: 03/29/15  9:00 PM  Result Value Ref Range Status   Specimen Description BLOOD RIGHT HAND  Final   Special Requests PEDIATRICS 2ML  Final   Culture   Final    NO GROWTH 5 DAYS Performed at [facility]    Report Status 04/03/2015 FINAL  Final     Scheduled Meds: . antiseptic oral rinse  7 mL Mouth Rinse q12n4p  . brimonidine  1 drop Both Eyes TID  . carvedilol  6.25 mg Oral BID  . cefTRIAXone (ROCEPHIN)  IV  1 g Intravenous Q24H  . chlorhexidine  15 mL Mouth Rinse BID  . dorzolamide-timolol  1 drop Right Eye BID  . feeding supplement (ENSURE ENLIVE)  237 mL Oral BID BM  . finasteride  5 mg Oral Daily  . Influenza vac split quadrivalent PF  0.5 mL Intramuscular Tomorrow-1000  . insulin aspart  0-9 Units Subcutaneous TID WC  . verapamil  240 mg Oral QHS   Continuous Infusions: . sodium chloride 100 mL/hr at 04/03/15 0331

## 2015-04-03 NOTE — Progress Notes (Signed)
Date:  April 03, 2015 U.R. performed for needs and level of care. Will continue to follow for Case Management needs.  Velva Harman, RN, BSN, [place]   [phone]

## 2015-04-03 NOTE — Progress Notes (Signed)
Clinical Social Work  Patient was discussed during progression meeting and MD reports patient is not medically stable to DC. CSW updated SNF who remains agreeable to accept patient when medically stable.  CSW will continue to follow.  [place], [place] [phone]

## 2015-04-04 DIAGNOSIS — E1122 Type 2 diabetes mellitus with diabetic chronic kidney disease: Secondary | ICD-10-CM

## 2015-04-04 DIAGNOSIS — N189 Chronic kidney disease, unspecified: Secondary | ICD-10-CM

## 2015-04-04 LAB — CBC
HEMATOCRIT: 32.7 % — AB (ref 39.0–52.0)
Hemoglobin: 10.5 g/dL — ABNORMAL LOW (ref 13.0–17.0)
MCH: 30.4 pg (ref 26.0–34.0)
MCHC: 32.1 g/dL (ref 30.0–36.0)
MCV: 94.8 fL (ref 78.0–100.0)
Platelets: 232 10*3/uL (ref 150–400)
RBC: 3.45 MIL/uL — ABNORMAL LOW (ref 4.22–5.81)
RDW: 14.2 % (ref 11.5–15.5)
WBC: 16.7 10*3/uL — ABNORMAL HIGH (ref 4.0–10.5)

## 2015-04-04 LAB — URINE CULTURE: Culture: NO GROWTH

## 2015-04-04 LAB — GLUCOSE, CAPILLARY
GLUCOSE-CAPILLARY: 122 mg/dL — AB (ref 65–99)
GLUCOSE-CAPILLARY: 111 mg/dL — AB (ref 65–99)
Glucose-Capillary: 89 mg/dL (ref 65–99)
Glucose-Capillary: 148 mg/dL — ABNORMAL HIGH (ref 65–99)

## 2015-04-04 LAB — C DIFFICILE QUICK SCREEN W PCR REFLEX
C DIFFICILE (CDIFF) INTERP: NEGATIVE
C Diff antigen: NEGATIVE
C Diff toxin: NEGATIVE

## 2015-04-04 NOTE — Progress Notes (Signed)
Physical Therapy Treatment Patient Details Name: Kent Mayo MRN: [id number] DOB: 06/04/1927 Today's Date: 04/04/2015    History of Present Illness 81 yo male admitted with AKI. Hx of CVA, DM, HTN, legall blind and very hard of hearing. Pt is from SNF    PT Comments    With NT assisted pt OOB to amb to BR.  Required + 2 assist for safety and direction for walker.  Assisted in bathroom then amb in hallway.  Unsteady gait.  HIGH FALL Risk.  Follow Up Recommendations  SNF     Equipment Recommendations       Recommendations for Other Services       Precautions / Restrictions Precautions Precautions: Fall Precaution Comments: pt legally blind and HOH with bilat hearing aides Restrictions Weight Bearing Restrictions: No    Mobility  Bed Mobility Overal bed mobility: Needs Assistance Bed Mobility: Supine to Sit     Supine to sit: Max assist     General bed mobility comments: increased time and assist with upper body.    Transfers Overall transfer level: Needs assistance Equipment used: Rolling walker (2 wheeled) Transfers: Sit to/from Stand Sit to Stand: Mod assist         General transfer comment: Assist to rise, stabilize, control descent. Multimodal cues for safety, hand placement.  Assisted OOb and on/off toilet.   Ambulation/Gait Ambulation/Gait assistance: Min assist;Mod assist;+2 safety/equipment Ambulation Distance (Feet): 85 Feet Assistive device: Rolling walker (2 wheeled) Gait Pattern/deviations: Step-to pattern;Step-through pattern;Decreased step length - right;Decreased step length - left;Trunk flexed;Shuffle Gait velocity: decreased   General Gait Details: + 2 assist for safety.  Due to poor vision, pt required assist with guiding walker around obsticles and safety with turns.     Stairs            Wheelchair Mobility    Modified Rankin (Stroke Patients Only)       Balance                                     Cognition Arousal/Alertness: Awake/alert Behavior During Therapy: WFL for tasks assessed/performed                        Exercises      General Comments        Pertinent Vitals/Pain Pain Assessment: No/denies pain    Home Living                      Prior Function            PT Goals (current goals can now be found in the care plan section) Progress towards PT goals: Progressing toward goals    Frequency  Min 3X/week    PT Plan      Co-evaluation             End of Session Equipment Utilized During Treatment: Gait belt Activity Tolerance: Patient tolerated treatment well Patient left: in chair;with call bell/phone within reach     Time: 6256-3893 PT Time Calculation (min) (ACUTE ONLY): 29 min  Charges:  $Gait Training: 8-22 mins $Therapeutic Activity: 8-22 mins                    G Codes:      Rica Koyanagi  PTA [facility]  Acute  Rehab Pager      [phone]

## 2015-04-04 NOTE — Plan of Care (Signed)
Problem: Phase I Progression Outcomes Goal: Voiding-avoid urinary catheter unless indicated Outcome: Not Met (add Reason) Patient has chronic foley     

## 2015-04-04 NOTE — Progress Notes (Addendum)
Patient ID: Kent Mayo, male   DOB: [birth year], 81 y.o.   MRN: [id number] [facility] PROGRESS NOTE  Kent Mayo QAS:[id number] DOB: 01-23-1927 DOA: 03/29/2015 PCP: No primary care provider on file. Pt is from SNF. Cardiology: Dr. Jenkins Rouge   Brief narrative:    77 -year-old male with past medical history of hypertension, diabetes, dyslipidemia, recent hospitalization from 03/18/2015 through 03/22/2015 for acute renal failure thought to be secondary to urinary retention. He had a Foley catheter placed and was subsequently discharged to nursing home. Patient presented this time with altered mental status. He was again found to have acute renal failure with hyperkalemia. On the admission, patient was found to have severely distended bladder with small amount of blood and clots in the Foley. Urology has seen the patient in consultation. Patient was placed on anti-biotics for urinary tract infection.  Anticipated discharge: to SNF 9/2 if hematuria improves.    Assessment/Plan:    Principal Problem: Acute encephalopathy - Likely related to combination of UTI and urinary retention. - Mental status improving. - Patient is being treated for UTI. He has Foley catheter in place. - To SNF on discharge   Active problems Acute renal failure / obstructive uropathy - Acute renal failure likely related to urinary retention from obstructive uropathy  - Creatinine improving with IV fluids - Follow-up BMP tomorrow morning.  Hyperkalemia - Likely from acute renal failure  - Potassium normalized.   Urinary tract infection secondary to indwelling foley catheter / leukocytosis - Urinalysis on admission showed large leukocytes and many bacteria - Patient was on cefpodoxime but because of worsening leukocytosis we switched to Rocephin on 04/03/2015.  Diarrhea  - Stool for C. difficile negative.  Hematuria - Evidence of gross blood in Foley catheter on admission - Continue to monitor  hematuria. Seems to be little bit better compared with yesterday. - Hemoglobin stable - No current indications for transfusion   Essential hypertension - Continue carvedilol 6.25 mg twice daily and verapamil 240 mg at bedtime  Diabetes mellitus with renal manifestations - CBG's in past 24 hours: 99, 109, 89 - Continue sliding scale insulin  BPH - Continue finasteride   Mild protein calorie malnutrition - In the context of acute illness. - Continue nutritional supplementation.     DVT Prophylaxis  - SCD's bilaterally    Code Status: Full.  Family Communication:  plan of care discussed with the patient Disposition Plan: to SNF likely 9/2.  IV access:  Peripheral IV  Procedures and diagnostic studies:    No results found.  Medical Consultants:  Urology  Other Consultants:  Physical therapy  IAnti-Infectives:   Zosyn; change to rocephin starting 04/03/2015 -->    Leisa Lenz, MD  [facility] Pager [phone]  Time spent in minutes: 15 minutes  If 7PM-7AM, please contact night-coverage [email or web address] Password [email or web address] 04/04/2015, 10:52 AM   LOS: 6 days    HPI/Subjective: No acute overnight events. Patient reports no pain this am.   Objective: Filed Vitals:   04/03/15 0900 04/03/15 1700 04/03/15 2118 04/04/15 0518  BP: 146/56 128/62 156/67 159/62  Pulse: 62 55 60 60  Temp:   97.7 F (36.5 C) 98.2 F (36.8 C)  TempSrc:   Oral Oral  Resp: 18 18 18 18   Height:      Weight:      SpO2: 100% 100% 98% 100%    Intake/Output Summary (Last 24 hours) at 04/04/15 1052 Last data filed at 04/04/15 0946  Gross per 24 hour  Intake 1398.33 ml  Output   2300 ml  Net -901.67 ml    Exam:   General:  Pt is alert, not in acute distress  Cardiovascular: Rate controlled, S1/S2 (+)  Respiratory: CNo wheezing, no crackles, no rhonchi  Abdomen: non tender, non distended abdomen, (+) BS, foley in place  Extremities: No leg swelling, pulses palpable    Neuro: Nonfocal  Data Reviewed: Basic Metabolic Panel:  Recent Labs Lab 03/30/15 0515 03/31/15 0530 04/01/15 0510 04/02/15 0717 04/03/15 0625  NA 139 144 143 143 142  K 4.5 3.8 3.9 3.9 4.5  CL 107 103 104 107 107  CO2 20* 31 31 30 27   GLUCOSE 139* 114* 129* 131* 95  BUN 99* 75* 46* 31* 22*  CREATININE 5.68* 3.21* 2.05* 1.61* 1.46*  CALCIUM 8.6* 8.4* 8.3* 8.4* 8.3*  PHOS 5.7*  --   --   --   --    Liver Function Tests:  Recent Labs Lab 03/29/15 1611 03/30/15 0515  AST 36  --   ALT 14*  --   ALKPHOS 102  --   BILITOT 0.3  --   PROT 7.3  --   ALBUMIN 3.1* 2.6*   No results for input(s): LIPASE, AMYLASE in the last 168 hours.  Recent Labs Lab 03/29/15 1612  AMMONIA 32   CBC:  Recent Labs Lab 03/29/15 1611  03/31/15 0530 04/01/15 0510 04/02/15 0717 04/03/15 0625 04/04/15 0706  WBC 18.8*  < > 14.6* 14.7* 15.1* 17.2* 16.7*  NEUTROABS 15.5*  --   --   --   --   --   --   HGB 12.7*  < > 12.1* 10.8* 11.1* 11.4* 10.5*  HCT 36.4*  < > 35.5* 32.7* 33.8* 34.7* 32.7*  MCV 86.7  < > 89.9 91.6 91.8 91.8 94.8  PLT 257  < > 216 236 256 221 232  < > = values in this interval not displayed. Cardiac Enzymes:  Recent Labs Lab 03/29/15 1611  CKTOTAL 107   BNP: Invalid input(s): POCBNP CBG:  Recent Labs Lab 04/03/15 0728 04/03/15 1212 04/03/15 1719 04/03/15 2133 04/04/15 0737  GLUCAP 90 164* 99 109* 89    Recent Results (from the past 240 hour(s))  Urine culture     Status: None   Collection Time: 03/29/15  6:52 PM  Result Value Ref Range Status   Specimen Description URINE, CLEAN CATCH  Final   Special Requests NONE  Final   Culture   Final    9,000 COLONIES/mL INSIGNIFICANT GROWTH Performed at [facility]    Report Status 03/31/2015 FINAL  Final  Culture, blood (routine x 2)     Status: None   Collection Time: 03/29/15  8:55 PM  Result Value Ref Range Status   Specimen Description BLOOD RIGHT ANTECUBITAL  Final   Special Requests  BOTTLES DRAWN AEROBIC AND ANAEROBIC 10ML  Final   Culture   Final    NO GROWTH 5 DAYS Performed at [facility]    Report Status 04/03/2015 FINAL  Final  Culture, blood (routine x 2)     Status: None   Collection Time: 03/29/15  9:00 PM  Result Value Ref Range Status   Specimen Description BLOOD RIGHT HAND  Final   Special Requests PEDIATRICS 2ML  Final   Culture   Final    NO GROWTH 5 DAYS Performed at [facility]    Report Status 04/03/2015 FINAL  Final  Culture, Urine     Status:  None   Collection Time: 04/03/15 11:00 AM  Result Value Ref Range Status   Specimen Description URINE, CLEAN CATCH  Final   Special Requests NONE  Final   Culture   Final    NO GROWTH 1 DAY Performed at [facility]    Report Status 04/04/2015 FINAL  Final  C difficile quick scan w PCR reflex     Status: None   Collection Time: 04/04/15  6:28 AM  Result Value Ref Range Status   C Diff antigen NEGATIVE NEGATIVE Corrected    Comment: CORRECTED RESULTS CALLED TO: CAUDLE RN AT 0840 ON 09.01.16 BY SHUEA CORRECTED ON 09/01 AT 4650: PREVIOUSLY REPORTED AS 0    C Diff toxin NEGATIVE NEGATIVE Corrected    Comment: CORRECTED RESULTS CALLED TO: CAUDLE RN AT 0840 ON 09.01.16 BY SHUEA CORRECTED ON 09/01 AT 3546: PREVIOUSLY REPORTED AS 0    C Diff interpretation Negative for toxigenic C. difficile  Final     Scheduled Meds: . antiseptic oral rinse  7 mL Mouth Rinse q12n4p  . brimonidine  1 drop Both Eyes TID  . carvedilol  6.25 mg Oral BID  . cefTRIAXone (ROCEPHIN)  IV  1 g Intravenous Q24H  . chlorhexidine  15 mL Mouth Rinse BID  . dorzolamide-timolol  1 drop Right Eye BID  . feeding supplement (ENSURE ENLIVE)  237 mL Oral BID BM  . finasteride  5 mg Oral Daily  . Influenza vac split quadrivalent PF  0.5 mL Intramuscular Tomorrow-1000  . insulin aspart  0-9 Units Subcutaneous TID WC  . verapamil  240 mg Oral QHS   Continuous Infusions: . sodium chloride 100 mL/hr at  04/03/15 1350

## 2015-04-04 NOTE — Care Management Important Message (Signed)
Important Message  Patient Details  Name: Kent Mayo MRN: [id number] Date of Birth: Jan 24, 1927   Medicare Important Message Given:  Yes-third notification given    Camillo Flaming 04/04/2015, 2:35 [place] Message  Patient Details  Name: Kent Mayo MRN: [id number] Date of Birth: [birth year]   Medicare Important Message Given:  Yes-third notification given    Camillo Flaming 04/04/2015, 2:35 PM

## 2015-04-04 NOTE — Progress Notes (Signed)
Clinical Social Work  Patient was discussed during progression meeting and MD reports possible DC tomorrow. CSW updated SNF who is agreeable to accept whenever medically stable.  [place],  [phone]

## 2015-04-05 DIAGNOSIS — R339 Retention of urine, unspecified: Secondary | ICD-10-CM

## 2015-04-05 LAB — GLUCOSE, CAPILLARY
GLUCOSE-CAPILLARY: 87 mg/dL (ref 65–99)
Glucose-Capillary: 98 mg/dL (ref 65–99)

## 2015-04-05 MED ORDER — CIPROFLOXACIN HCL 500 MG PO TABS: 500.0000 mg | ORAL_TABLET | Freq: Two times a day (BID) | ORAL | Status: DC

## 2015-04-05 MED ORDER — ACETAMINOPHEN 325 MG PO TABS: 650.0000 mg | ORAL_TABLET | Freq: Four times a day (QID) | ORAL | Status: DC | PRN

## 2015-04-05 NOTE — Plan of Care (Signed)
Problem: Phase I Progression Outcomes Goal: Voiding-avoid urinary catheter unless indicated Outcome: Adequate for Discharge Chronic foley

## 2015-04-05 NOTE — Progress Notes (Signed)
Subjective: Patient reports no pain. ARF resolved with foley drainage. Urine very light pink Objective: Vital signs in last 24 hours: Temp:  [97.6 F (36.4 C)-98.6 F (37 C)] 97.7 F (36.5 C) (09/02 0553) Pulse Rate:  [61-70] 61 (09/02 0553) Resp:  [15-18] 18 (09/02 0553) BP: (154-161)/(69-72) 161/69 mmHg (09/02 0553) SpO2:  [98 %-99 %] 98 % (09/02 0553)  Intake/Output from previous day: 09/01 0701 - 09/02 0700 In: 3483.3 [P.O.:260; I.V.:3223.3] Out: 2650 [Urine:2650] Intake/Output this shift: Total I/O In: 240 [P.O.:240] Out: -   Physical Exam:  General:alert and appears stated age GI: soft, non tender, normal bowel sounds, no palpable masses, no organomegaly, no inguinal hernia Male genitalia: no penile lesions or discharge no testicular masses Resp: clear to auscultation bilaterally Extremities: extremities normal, atraumatic, no cyanosis or edema  Lab Results:  Recent Labs  04/03/15 0625 04/04/15 0706  HGB 11.4* 10.5*  HCT 34.7* 32.7*   BMET  Recent Labs  04/03/15 0625  NA 142  K 4.5  CL 107  CO2 27  GLUCOSE 95  BUN 22*  CREATININE 1.46*  CALCIUM 8.3*   No results for input(s): LABPT, INR in the last 72 hours. No results for input(s): LABURIN in the last 72 hours. Results for orders placed or performed during the hospital encounter of 03/29/15  Urine culture     Status: None   Collection Time: 03/29/15  6:52 PM  Result Value Ref Range Status   Specimen Description URINE, CLEAN CATCH  Final   Special Requests NONE  Final   Culture   Final    9,000 COLONIES/mL INSIGNIFICANT GROWTH Performed at [facility]    Report Status 03/31/2015 FINAL  Final  Culture, blood (routine x 2)     Status: None   Collection Time: 03/29/15  8:55 PM  Result Value Ref Range Status   Specimen Description BLOOD RIGHT ANTECUBITAL  Final   Special Requests BOTTLES DRAWN AEROBIC AND ANAEROBIC 10ML  Final   Culture   Final    NO GROWTH 5 DAYS Performed at  [facility]    Report Status 04/03/2015 FINAL  Final  Culture, blood (routine x 2)     Status: None   Collection Time: 03/29/15  9:00 PM  Result Value Ref Range Status   Specimen Description BLOOD RIGHT HAND  Final   Special Requests PEDIATRICS 2ML  Final   Culture   Final    NO GROWTH 5 DAYS Performed at [facility]    Report Status 04/03/2015 FINAL  Final  Culture, Urine     Status: None   Collection Time: 04/03/15 11:00 AM  Result Value Ref Range Status   Specimen Description URINE, CLEAN CATCH  Final   Special Requests NONE  Final   Culture   Final    NO GROWTH 1 DAY Performed at [facility]    Report Status 04/04/2015 FINAL  Final  C difficile quick scan w PCR reflex     Status: None   Collection Time: 04/04/15  6:28 AM  Result Value Ref Range Status   C Diff antigen NEGATIVE NEGATIVE Corrected    Comment: CORRECTED RESULTS CALLED TO: CAUDLE RN AT 0840 ON 09.01.16 BY SHUEA CORRECTED ON 09/01 AT 0842: PREVIOUSLY REPORTED AS 0    C Diff toxin NEGATIVE NEGATIVE Corrected    Comment: CORRECTED RESULTS CALLED TO: CAUDLE RN AT 0840 ON 09.01.16 BY SHUEA CORRECTED ON 09/01 AT 0842: PREVIOUSLY REPORTED AS 0  C Diff interpretation Negative for toxigenic C. difficile  Final    Studies/Results: No results found.  Assessment/Plan: 81yo with urinary retention, ARF (resolved), and gross hematuria  Recs: 1. Continue foley to straight drain, voiding trial in 1 week with urology 2. Continue finasteride 3. Vantin 200mg  BID for 7 days 4.   LOS: 7 days   Kent Mayo L 04/05/2015, 11:14 AM

## 2015-04-05 NOTE — Plan of Care (Signed)
Problem: Phase II Progression Outcomes Goal: Obtain order to discontinue catheter if appropriate Outcome: Adequate for Discharge Chronic foley

## 2015-04-05 NOTE — Discharge Summary (Signed)
Physician Discharge Summary  REVIS WHALIN JJH:[id number] DOB: Jun 04, 1927 DOA: 03/29/2015  PCP: No primary care provider on file. - pt is in SNF  Admit date: 03/29/2015 Discharge date: 04/05/2015  Recommendations for Outpatient Follow-up:  1. Continue cipro for 2 more days on discharge. 2. Hold aspirin until hematuria completely resolves.  Discharge Diagnoses:  Principal Problem:   AKI (acute kidney injury) Active Problems:   HTN (hypertension)   Diabetes mellitus   Urinary retention   Acute encephalopathy   Acute renal failure   UTI (urinary tract infection)    Discharge Condition: stable   Diet recommendation: as tolerated   History of present illness:  81 -year-old male with past medical history of hypertension, diabetes, dyslipidemia, recent hospitalization from 03/18/2015 through 03/22/2015 for acute renal failure thought to be secondary to urinary retention. He had a Foley catheter placed and was subsequently discharged to nursing home. Patient presented this time with altered mental status. He was again found to have acute renal failure with hyperkalemia. On the admission, patient was found to have severely distended bladder with small amount of blood and clots in the Foley. Urology has seen the patient in consultation. Patient was placed on anti-biotics for urinary tract infection.  Hospital Course:    Assessment/Plan:    Principal Problem: Acute encephalopathy - Likely related to combination of UTI and urinary retention. - Oriented to time, place and person this am - Treated for UTI with abx - will take cipro for 2 more days on discharge (total of 10 day treatment for UTI) - Per PT - pt may return to SNF    Active problems Acute renal failure / obstructive uropathy - Acute renal failure likely related to urinary retention from obstructive uropathy  - Creatinine has improved with IV fluids   Hyperkalemia - Likely from acute renal failure  - Potassium  normalized.   Urinary tract infection secondary to indwelling foley catheter / leukocytosis - Urinalysis on admission showed large leukocytes and many bacteria - Patient was on cefpodoxime but because of worsening leukocytosis it was switched to Rocephin on 04/03/2015. Will take cipro for 2 more days on discharge.  - Leukocytosis improving.  Diarrhea  - Stool for C. difficile negative. - Has had 2 BM's in past 24 hours   Hematuria - Evidence of gross blood in Foley catheter on admission - Hematuria improving - Continue to hold aspirin until hematuria resolves  Essential hypertension - Continue carvedilol 6.25 mg twice daily and verapamil 240 mg at bedtime  Diabetes mellitus with renal manifestations - A1c 2 weeks ago not indicative of DM - CBG's 148, 111, 87 - May use SSI if needed in nursing home.  BPH - Continue finasteride   Mild protein calorie malnutrition - In the context of acute illness. - Continue nutritional supplementation.    DVT Prophylaxis  - SCD's bilaterally while pt in hospital    Code Status: Full.  Family Communication: plan of care discussed with the patient; family not at the bedside   IV access:  Peripheral IV  Procedures and diagnostic studies:   No results found.  Medical Consultants:  Urology  Other Consultants:  Physical therapy  IAnti-Infectives:   Zosyn; change to rocephin starting 04/03/2015 --> 04/05/2015   Signed:  Leisa Lenz, MD  [facility] 04/05/2015, 9:55 AM  Pager #: [phone]  Time spent in minutes: more than 30 minutes    Discharge Exam: Filed Vitals:   04/05/15 0553  BP: 161/69  Pulse: 61  Temp: 97.7 F (36.5 C)  Resp: 18   Filed Vitals:   04/04/15 0518 04/04/15 1353 04/04/15 2109 04/05/15 0553  BP: 159/62 155/70 154/72 161/69  Pulse: 60 70 69 61  Temp: 98.2 F (36.8 C) 98.6 F (37 C) 97.6 F (36.4 C) 97.7 F (36.5 C)  TempSrc: Oral Oral Oral Oral  Resp: 18 15 18 18    Height:      Weight:      SpO2: 100% 99% 99% 98%    General: Pt is alert, follows commands appropriately, not in acute distress Cardiovascular: Regular rate and rhythm, S1/S2 + Respiratory: Clear to auscultation bilaterally, no wheezing, no crackles, no rhonchi Abdominal: Soft, non tender, non distended, bowel sounds +, no guarding Extremities: no edema, no cyanosis, pulses palpable bilaterally DP and PT Neuro: Grossly nonfocal  Discharge Instructions  Discharge Instructions    Call MD for:  difficulty breathing, headache or visual disturbances    Complete by:  As directed      Call MD for:  persistant nausea and vomiting    Complete by:  As directed      Call MD for:  severe uncontrolled pain    Complete by:  As directed      Diet - low sodium heart healthy    Complete by:  As directed      Discharge instructions    Complete by:  As directed   1. Continue cipro for 2 more days on discharge. 2. Hold aspirin until hematuria completely resolves.     Increase activity slowly    Complete by:  As directed             Medication List    STOP taking these medications        aspirin 81 MG tablet     cefTRIAXone 1 g in dextrose 5 % 50 mL     opium-belladonna 16.2-60 MG suppository  Commonly known as:  B&O SUPPRETTES      TAKE these medications        acetaminophen 325 MG tablet  Commonly known as:  TYLENOL  Take 2 tablets (650 mg total) by mouth every 6 (six) hours as needed for mild pain (or Fever >/= 101).     atorvastatin 10 MG tablet  Commonly known as:  LIPITOR  Take 10 mg by mouth at bedtime.     brimonidine 0.2 % ophthalmic solution  Commonly known as:  ALPHAGAN  Place 1 drop into both eyes 3 (three) times daily.     carvedilol 6.25 MG tablet  Commonly known as:  COREG  Take 6.25 mg by mouth 2 (two) times daily. With food.     ciprofloxacin 500 MG tablet  Commonly known as:  CIPRO  Take 1 tablet (500 mg total) by mouth 2 (two) times daily.      dorzolamide-timolol 22.3-6.8 MG/ML ophthalmic solution  Commonly known as:  COSOPT  Place 1 drop into the right eye 2 (two) times daily.     feeding supplement (ENSURE ENLIVE) Liqd  Take 237 mLs by mouth 2 (two) times daily between meals.     finasteride 5 MG tablet  Commonly known as:  PROSCAR  Take 5 mg by mouth daily.     mirabegron ER 25 MG Tb24 tablet  Commonly known as:  MYRBETRIQ  Take 1 tablet (25 mg total) by mouth daily. Stop after last dose on 03/29/15     senna-docusate 8.6-50 MG per tablet  Commonly known as:  Senokot-S  Take 1 tablet by mouth at bedtime as needed for mild constipation.     verapamil 240 MG CR tablet  Commonly known as:  CALAN-SR  Take 240 mg by mouth at bedtime.          The results of significant diagnostics from this hospitalization (including imaging, microbiology, ancillary and laboratory) are listed below for reference.    Significant Diagnostic Studies: Dg Chest 2 View  03/19/2015   CLINICAL DATA:  Elevated white blood cell count, history of previous CVA, diabetes, and acute renal insufficiency.  EXAM: CHEST  2 VIEW  COMPARISON:  PA and lateral chest x-ray of February 22, 2013  FINDINGS: The lungs are adequately inflated and clear. There is chronic scarring at the left lung base. The cardiac silhouette remains enlarged. The pulmonary vascularity is normal. There is no pleural effusion. The mediastinum is normal in width. There is mild tortuosity of the ascending and descending thoracic aorta. The bony thorax exhibits no acute abnormality.  IMPRESSION: No active cardiopulmonary disease.  Stable mild cardiomegaly.   Electronically Signed   By: David  Martinique M.D.   On: 03/19/2015 11:53   US Abdomen Limited Ruq  03/21/2015   CLINICAL DATA:  Elevated liver enzymes  EXAM: US ABDOMEN LIMITED - RIGHT UPPER QUADRANT  COMPARISON:  None.  FINDINGS: Gallbladder:  No gallstones or wall thickening visualized. No pericholecystic fluid. A modest amount of sludge is  noted in the gallbladder. No sonographic Murphy sign noted.  Common bile duct:  Diameter: 7 mm. No intrahepatic or extrahepatic biliary duct dilatation.  Liver:  No focal lesion identified. Liver echogenicity appears mildly increased.  IMPRESSION: Mild sludge in gallbladder. No gallstones seen. No gallbladder wall thickening or pericholecystic fluid.  Liver echogenicity appears mildly increased. Question a degree of hepatic steatosis. No focal liver lesions are identified. It should be noted that the sensitivity of ultrasound for focal liver lesions is somewhat diminished with underlying hepatic steatosis.   Electronically Signed   By: Lowella Grip III M.D.   On: 03/21/2015 19:35    Microbiology: Recent Results (from the past 240 hour(s))  Urine culture     Status: None   Collection Time: 03/29/15  6:52 PM  Result Value Ref Range Status   Specimen Description URINE, CLEAN CATCH  Final   Special Requests NONE  Final   Culture   Final    9,000 COLONIES/mL INSIGNIFICANT GROWTH Performed at [facility]    Report Status 03/31/2015 FINAL  Final  Culture, blood (routine x 2)     Status: None   Collection Time: 03/29/15  8:55 PM  Result Value Ref Range Status   Specimen Description BLOOD RIGHT ANTECUBITAL  Final   Special Requests BOTTLES DRAWN AEROBIC AND ANAEROBIC 10ML  Final   Culture   Final    NO GROWTH 5 DAYS Performed at [facility]    Report Status 04/03/2015 FINAL  Final  Culture, blood (routine x 2)     Status: None   Collection Time: 03/29/15  9:00 PM  Result Value Ref Range Status   Specimen Description BLOOD RIGHT HAND  Final   Special Requests PEDIATRICS 2ML  Final   Culture   Final    NO GROWTH 5 DAYS Performed at [facility]    Report Status 04/03/2015 FINAL  Final  Culture, Urine     Status: None   Collection Time: 04/03/15 11:00 AM  Result Value Ref Range Status   Specimen Description URINE, CLEAN  CATCH  Final   Special Requests NONE   Final   Culture   Final    NO GROWTH 1 DAY Performed at [facility]    Report Status 04/04/2015 FINAL  Final  C difficile quick scan w PCR reflex     Status: None   Collection Time: 04/04/15  6:28 AM  Result Value Ref Range Status   C Diff antigen NEGATIVE NEGATIVE Corrected    Comment: CORRECTED RESULTS CALLED TO: CAUDLE RN AT 0840 ON 09.01.16 BY SHUEA CORRECTED ON 09/01 AT 0842: PREVIOUSLY REPORTED AS 0    C Diff toxin NEGATIVE NEGATIVE Corrected    Comment: CORRECTED RESULTS CALLED TO: CAUDLE RN AT 0840 ON 09.01.16 BY SHUEA CORRECTED ON 09/01 AT 0842: PREVIOUSLY REPORTED AS 0    C Diff interpretation Negative for toxigenic C. difficile  Final     Labs: Basic Metabolic Panel:  Recent Labs Lab 03/30/15 0515 03/31/15 0530 04/01/15 0510 04/02/15 0717 04/03/15 0625  NA 139 144 143 143 142  K 4.5 3.8 3.9 3.9 4.5  CL 107 103 104 107 107  CO2 20* 31 31 30 27   GLUCOSE 139* 114* 129* 131* 95  BUN 99* 75* 46* 31* 22*  CREATININE 5.68* 3.21* 2.05* 1.61* 1.46*  CALCIUM 8.6* 8.4* 8.3* 8.4* 8.3*  PHOS 5.7*  --   --   --   --    Liver Function Tests:  Recent Labs Lab 03/29/15 1611 03/30/15 0515  AST 36  --   ALT 14*  --   ALKPHOS 102  --   BILITOT 0.3  --   PROT 7.3  --   ALBUMIN 3.1* 2.6*   No results for input(s): LIPASE, AMYLASE in the last 168 hours.  Recent Labs Lab 03/29/15 1612  AMMONIA 32   CBC:  Recent Labs Lab 03/29/15 1611  03/31/15 0530 04/01/15 0510 04/02/15 0717 04/03/15 0625 04/04/15 0706  WBC 18.8*  < > 14.6* 14.7* 15.1* 17.2* 16.7*  NEUTROABS 15.5*  --   --   --   --   --   --   HGB 12.7*  < > 12.1* 10.8* 11.1* 11.4* 10.5*  HCT 36.4*  < > 35.5* 32.7* 33.8* 34.7* 32.7*  MCV 86.7  < > 89.9 91.6 91.8 91.8 94.8  PLT 257  < > 216 236 256 221 232  < > = values in this interval not displayed. Cardiac Enzymes:  Recent Labs Lab 03/29/15 1611  CKTOTAL 107   BNP: BNP (last 3 results) No results for input(s): BNP in the last 8760  hours.  ProBNP (last 3 results) No results for input(s): PROBNP in the last 8760 hours.  CBG:  Recent Labs Lab 04/04/15 0737 04/04/15 1139 04/04/15 1606 04/04/15 2106 04/05/15 0742  GLUCAP 89 122* 148* 111* 87

## 2015-04-05 NOTE — Progress Notes (Signed)
Clinical Social Work  CSW faxed DC summary to [employer] who is agreeable to accept today. DC packet prepared with FL2, DC summary and chart copy included. Patient and brother aware and agreeable to DC. RN to call report. PTAR arranged for 12pm. PTAR request #: [id number].  CSW is signing off but available if needed.  [place], [place] [phone]

## 2015-04-05 NOTE — Progress Notes (Signed)
Pt discharge back to snf via ptar.  Discharge packet given to ptar. Report called to Benita at [facility].

## 2015-04-05 NOTE — Discharge Instructions (Signed)

## 2015-05-21 ENCOUNTER — Emergency Department (HOSPITAL_COMMUNITY): Payer: Medicare Other

## 2015-05-21 ENCOUNTER — Encounter (HOSPITAL_COMMUNITY): Payer: Self-pay | Admitting: Emergency Medicine

## 2015-05-21 ENCOUNTER — Emergency Department (HOSPITAL_COMMUNITY)
Admission: EM | Admit: 2015-05-21 | Discharge: 2015-05-21 | Disposition: A | Discharge status: Home / Self Care | Payer: Medicare Other | Attending: Emergency Medicine | Admitting: Emergency Medicine

## 2015-05-21 DIAGNOSIS — Y998 Other external cause status: Secondary | ICD-10-CM | POA: Insufficient documentation

## 2015-05-21 DIAGNOSIS — Y9289 Other specified places as the place of occurrence of the external cause: Secondary | ICD-10-CM | POA: Diagnosis not present

## 2015-05-21 DIAGNOSIS — Z87440 Personal history of urinary (tract) infections: Secondary | ICD-10-CM | POA: Diagnosis not present

## 2015-05-21 DIAGNOSIS — S01511A Laceration without foreign body of lip, initial encounter: Secondary | ICD-10-CM | POA: Diagnosis not present

## 2015-05-21 DIAGNOSIS — S199XXA Unspecified injury of neck, initial encounter: Secondary | ICD-10-CM | POA: Insufficient documentation

## 2015-05-21 DIAGNOSIS — E119 Type 2 diabetes mellitus without complications: Secondary | ICD-10-CM | POA: Diagnosis not present

## 2015-05-21 DIAGNOSIS — Y9389 Activity, other specified: Secondary | ICD-10-CM | POA: Diagnosis not present

## 2015-05-21 DIAGNOSIS — H540 Blindness, both eyes: Secondary | ICD-10-CM | POA: Insufficient documentation

## 2015-05-21 DIAGNOSIS — Z87891 Personal history of nicotine dependence: Secondary | ICD-10-CM | POA: Diagnosis not present

## 2015-05-21 DIAGNOSIS — Z8546 Personal history of malignant neoplasm of prostate: Secondary | ICD-10-CM | POA: Diagnosis not present

## 2015-05-21 DIAGNOSIS — W1839XA Other fall on same level, initial encounter: Secondary | ICD-10-CM | POA: Diagnosis not present

## 2015-05-21 DIAGNOSIS — S0990XA Unspecified injury of head, initial encounter: Secondary | ICD-10-CM | POA: Insufficient documentation

## 2015-05-21 DIAGNOSIS — I10 Essential (primary) hypertension: Secondary | ICD-10-CM | POA: Diagnosis not present

## 2015-05-21 DIAGNOSIS — S0992XA Unspecified injury of nose, initial encounter: Secondary | ICD-10-CM | POA: Diagnosis not present

## 2015-05-21 DIAGNOSIS — Z79899 Other long term (current) drug therapy: Secondary | ICD-10-CM | POA: Insufficient documentation

## 2015-05-21 HISTORY — DX: Urinary tract infection, site not specified: N39.0

## 2015-05-21 HISTORY — DX: Dysphagia, unspecified: R13.10

## 2015-05-21 HISTORY — DX: Nontraumatic intracerebral hemorrhage, unspecified: I61.9

## 2015-05-21 HISTORY — DX: Disorder of kidney and ureter, unspecified: N28.9

## 2015-05-21 HISTORY — DX: Muscle weakness (generalized): M62.81

## 2015-05-21 HISTORY — DX: Cognitive communication deficit: R41.841

## 2015-05-21 HISTORY — DX: Encephalopathy, unspecified: G93.40

## 2015-05-21 HISTORY — DX: Neoplasm of unspecified behavior of other genitourinary organ: D49.59

## 2015-05-21 HISTORY — DX: Retention of urine, unspecified: R33.9

## 2015-05-21 HISTORY — DX: Unspecified kidney failure: N19

## 2015-05-21 MED ORDER — BACITRACIN ZINC 500 UNIT/GM EX OINT: TOPICAL_OINTMENT | Freq: Two times a day (BID) | CUTANEOUS | Status: DC

## 2015-05-21 NOTE — ED Provider Notes (Signed)
CSN: [id number]     Arrival date & time 05/21/15  0807 History   First MD Initiated Contact with Patient 05/21/15 0809     Chief Complaint  Patient presents with  . Fall  . Neck Pain  . Head Injury     (Consider location/radiation/quality/duration/timing/severity/associated sxs/prior Treatment) HPI Comments: 81 y.o. Male with history of DM, HTN, hyperlipidemia, CVA, blindness, hard of hearing presents following a fall.  The patient and EMS both state that the patient was attempting to get up from bed without his normal assistance device and that he fell face forward onto the ground.  The patient reports pain in his face at the lip and forehead.  Per EMS he also complained on neck pain on their examination.  Patient did not have LOC and per facility and EMS patient at his neurologic baseline.  No blood thinners.     Past Medical History  Diagnosis Date  . Diabetes mellitus without complication ([place])   . Hypertension   . Hearing difficulty of both ears     Bilateral hearing aids  . Blindness     Severe visual impairment OD, Blind OS  . Abnormality of gait   . Peripheral edema   . Glaucoma   . Dyslipidemia   . 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 UTI (urinary tract infection)   . Generalized muscle weakness   . Renal disorder   . Dysphagia   . Cognitive communication deficit   . Encephalopathy   . Urinary retention   . Neoplasm of prostate   . Renal failure    Past Surgical History  Procedure Laterality Date  . Eye surgery     Family History  Problem Relation Age of Onset  . Hypertension Mother   . Hypertension Father   . Diabetes Mellitus II Brother    Social History  Substance Use Topics  . Smoking status: Former Research scientist (life sciences)  . Smokeless tobacco: Not on file  . Alcohol Use: No    Review of Systems  Constitutional: Negative for fever, chills and fatigue.  HENT: Negative for congestion, postnasal drip and rhinorrhea.   Eyes:       Blindness  Respiratory:  Negative for shortness of breath.   Cardiovascular: Negative for chest pain and palpitations.  Gastrointestinal: Negative for nausea, vomiting and abdominal pain.  Genitourinary: Negative for flank pain and testicular pain.  Musculoskeletal: Positive for neck pain. Negative for back pain.  Skin: Positive for wound (to bridge of nose and upper lip).  Neurological: Negative for syncope and headaches.  Hematological: Does not bruise/bleed easily.      Allergies  Review of patient's allergies indicates no known allergies.  Home Medications   Prior to Admission medications   Medication Sig Start Date End Date Taking? Authorizing Provider  acetaminophen (TYLENOL) 325 MG tablet Take 2 tablets (650 mg total) by mouth every 6 (six) hours as needed for mild pain (or Fever >/= 101). 04/05/15  Yes [name], MD  atorvastatin (LIPITOR) 10 MG tablet Take 10 mg by mouth at bedtime.   Yes Historical Provider, MD  brimonidine (ALPHAGAN) 0.2 % ophthalmic solution Place 1 drop into both eyes 3 (three) times daily.   Yes Historical Provider, MD  carvedilol (COREG) 6.25 MG tablet Take 6.25 mg by mouth 2 (two) times daily. With food. 08/23/14  Yes Historical Provider, MD  dorzolamide-timolol (COSOPT) 22.3-6.8 MG/ML ophthalmic solution Place 1 drop into the right eye 2 (two) times daily.   Yes Historical Provider,  MD  finasteride (PROSCAR) 5 MG tablet Take 5 mg by mouth daily. 10/11/14  Yes Historical Provider, MD  mirtazapine (REMERON) 15 MG tablet Take 15 mg by mouth at bedtime.   Yes Historical Provider, MD  senna-docusate (SENOKOT-S) 8.6-50 MG per tablet Take 1 tablet by mouth at bedtime as needed for mild constipation. 03/22/15  Yes [name], MD  verapamil (CALAN-SR) 240 MG CR tablet Take 240 mg by mouth at bedtime.   Yes Historical Provider, MD  feeding supplement, ENSURE ENLIVE, (ENSURE ENLIVE) LIQD Take 237 mLs by mouth 2 (two) times daily between meals. Patient not taking: Reported on 05/21/2015  03/22/15   [name], MD   BP 156/74 mmHg  Pulse 64  Temp(Src) 97.4 F (36.3 C) (Oral)  Resp 26  SpO2 100% Physical Exam  Constitutional: No distress.  HENT:  Head: Normocephalic. Head is with abrasion (small abrasions over upper lip and bridge of nose as depicted in diagram) and with contusion. Head is without Battle's sign.    Nose: No nasal septal hematoma. No epistaxis.  Mouth/Throat: Oropharynx is clear and moist. No oropharyngeal exudate.  Bruising to mucosa of inner lip without inner laceration.  Bony structures of the face stable without pain on palpation  Eyes: EOM are normal. Right conjunctiva has no hemorrhage. Left conjunctiva has no hemorrhage. Pupils are unequal.  Cloudy/hazy left eye, asymmetric right pupil  Neck: Neck supple. Muscular tenderness present. No spinous process tenderness present. Decreased range of motion: appears chronic.  Cardiovascular: Normal rate, regular rhythm and intact distal pulses.   Pulmonary/Chest: Effort normal. No respiratory distress. He has no wheezes. He has no rales. He exhibits no tenderness.  Abdominal: Soft. He exhibits no distension. There is no tenderness.  Musculoskeletal: Normal range of motion. He exhibits no tenderness.  All extremities ranged and pelvis rocked without pain  Neurological: He is alert. He has normal strength. No cranial nerve deficit or sensory deficit. He exhibits normal muscle tone.  Symmetric strength in all extremities  Skin: He is not diaphoretic.  Vitals reviewed.   ED Course  Procedures (including critical care time) Labs Review Labs Reviewed - No data to display  Imaging Review Ct Head Wo Contrast  05/21/2015  CLINICAL DATA:  Fall, frontal hematoma EXAM: CT HEAD WITHOUT CONTRAST CT CERVICAL SPINE WITHOUT CONTRAST TECHNIQUE: Multidetector CT imaging of the head and cervical spine was performed following the standard protocol without intravenous contrast. Multiplanar CT image reconstructions of the  cervical spine were also generated. COMPARISON:  02/25/2013 FINDINGS: CT HEAD FINDINGS No skull fracture is noted. There is scalp swelling and small scalp hematoma midline frontal region. No intracranial hemorrhage, mass effect or midline shift. Stable atrophy and chronic white matter disease. No acute cortical infarction. No mass lesion is noted on this unenhanced scan. Mild scalp swelling midline posterior parietal region axial image 25. CT CERVICAL SPINE FINDINGS Axial images of the cervical spine shows no acute fracture or subluxation. Computer processed images shows extensive degenerative changes C1-C2 articulation. There is anterior spurring lower endplate of C3 vertebral body. Mild disc space flattening with anterior spurring at C4-C5 level. Disc space flattening with anterior spurring at C5-C6 level and C6-C7 level. Disc space flattening with anterior spurring at C7-T1 level. No prevertebral soft tissue swelling. Cervical airway is patent. There is no pneumothorax in visualized lung apices. Bilateral apical scarring. IMPRESSION: 1. No acute intracranial abnormality. There is scalp swelling and small hematoma midline frontal region. Small scalp swelling midline posterior parietal region. Stable atrophy  and chronic white matter disease. 2. No cervical spine acute fracture or subluxation. Multilevel degenerative changes as described above. Electronically Signed   By: Lahoma Crocker M.D.   On: 05/21/2015 09:40   Ct Cervical Spine Wo Contrast  05/21/2015  CLINICAL DATA:  Fall, frontal hematoma EXAM: CT HEAD WITHOUT CONTRAST CT CERVICAL SPINE WITHOUT CONTRAST TECHNIQUE: Multidetector CT imaging of the head and cervical spine was performed following the standard protocol without intravenous contrast. Multiplanar CT image reconstructions of the cervical spine were also generated. COMPARISON:  02/25/2013 FINDINGS: CT HEAD FINDINGS No skull fracture is noted. There is scalp swelling and small scalp hematoma midline  frontal region. No intracranial hemorrhage, mass effect or midline shift. Stable atrophy and chronic white matter disease. No acute cortical infarction. No mass lesion is noted on this unenhanced scan. Mild scalp swelling midline posterior parietal region axial image 25. CT CERVICAL SPINE FINDINGS Axial images of the cervical spine shows no acute fracture or subluxation. Computer processed images shows extensive degenerative changes C1-C2 articulation. There is anterior spurring lower endplate of C3 vertebral body. Mild disc space flattening with anterior spurring at C4-C5 level. Disc space flattening with anterior spurring at C5-C6 level and C6-C7 level. Disc space flattening with anterior spurring at C7-T1 level. No prevertebral soft tissue swelling. Cervical airway is patent. There is no pneumothorax in visualized lung apices. Bilateral apical scarring. IMPRESSION: 1. No acute intracranial abnormality. There is scalp swelling and small hematoma midline frontal region. Small scalp swelling midline posterior parietal region. Stable atrophy and chronic white matter disease. 2. No cervical spine acute fracture or subluxation. Multilevel degenerative changes as described above. Electronically Signed   By: Lahoma Crocker M.D.   On: 05/21/2015 09:40   I have personally reviewed and evaluated these images and lab results as part of my medical decision-making.   EKG Interpretation   Date/Time:  Tuesday May 21 2015 08:17:26 EDT Ventricular Rate:  64 PR Interval:  191 QRS Duration: 88 QT Interval:  430 QTC Calculation: 444 R Axis:   -18 Text Interpretation:  Sinus rhythm Borderline left axis deviation No  significant change since last tracing Confirmed by NGUYEN, EMILY ([id number])  on 05/21/2015 9:25:40 AM      MDM  Patient seen and evaluated in stable condition.  Mechanical fall.  At patient baseline.  Benign examination other than injuries as noted above. CT head and cervical spine negative for acute  process.  C collar removed.  Patient with muscular tenderness mainly over the SCMs bilaterally but no midline tenderness.  Patient discharged home in stable condition. Final diagnoses:  Head injury, initial encounter    1. Mechanical fall  2. Head injury  3. Facial abrasions    Harvel Quale, MD 05/21/15 1050

## 2015-05-21 NOTE — ED Notes (Signed)
Patient transported to CT 

## 2015-05-21 NOTE — ED Notes (Signed)
Pt from [facility] via [place] with c/o unwitnessed fall from standing this morning with hematoma to forehead, lip pain, neck pain, and small laceration to bridge of nose.  Pt was found face down in the floor, neuro intact, no LOC, pt recalls all events.  No blood thinners. Pt blind and severely hard of hearing.  Pt oriented to baseline x 2.  NAD.

## 2015-05-21 NOTE — Discharge Instructions (Signed)

## 2015-05-22 ENCOUNTER — Encounter (HOSPITAL_COMMUNITY): Payer: Self-pay | Admitting: Emergency Medicine

## 2015-05-22 ENCOUNTER — Emergency Department (HOSPITAL_COMMUNITY)
Admission: EM | Admit: 2015-05-22 | Discharge: 2015-05-22 | Disposition: A | Discharge status: Home / Self Care | Payer: Medicare Other | Attending: Emergency Medicine | Admitting: Emergency Medicine

## 2015-05-22 DIAGNOSIS — Z87440 Personal history of urinary (tract) infections: Secondary | ICD-10-CM | POA: Diagnosis not present

## 2015-05-22 DIAGNOSIS — H540 Blindness, both eyes: Secondary | ICD-10-CM | POA: Diagnosis not present

## 2015-05-22 DIAGNOSIS — E785 Hyperlipidemia, unspecified: Secondary | ICD-10-CM | POA: Diagnosis not present

## 2015-05-22 DIAGNOSIS — E119 Type 2 diabetes mellitus without complications: Secondary | ICD-10-CM | POA: Insufficient documentation

## 2015-05-22 DIAGNOSIS — Z8546 Personal history of malignant neoplasm of prostate: Secondary | ICD-10-CM | POA: Insufficient documentation

## 2015-05-22 DIAGNOSIS — Z87891 Personal history of nicotine dependence: Secondary | ICD-10-CM | POA: Diagnosis not present

## 2015-05-22 DIAGNOSIS — Y829 Unspecified medical devices associated with adverse incidents: Secondary | ICD-10-CM | POA: Insufficient documentation

## 2015-05-22 DIAGNOSIS — I10 Essential (primary) hypertension: Secondary | ICD-10-CM | POA: Insufficient documentation

## 2015-05-22 DIAGNOSIS — T839XXA Unspecified complication of genitourinary prosthetic device, implant and graft, initial encounter: Secondary | ICD-10-CM

## 2015-05-22 DIAGNOSIS — H9193 Unspecified hearing loss, bilateral: Secondary | ICD-10-CM | POA: Diagnosis not present

## 2015-05-22 DIAGNOSIS — Y846 Urinary catheterization as the cause of abnormal reaction of the patient, or of later complication, without mention of misadventure at the time of the procedure: Secondary | ICD-10-CM | POA: Diagnosis not present

## 2015-05-22 DIAGNOSIS — T83098A Other mechanical complication of other indwelling urethral catheter, initial encounter: Secondary | ICD-10-CM | POA: Diagnosis not present

## 2015-05-22 MED ORDER — PHENAZOPYRIDINE HCL 200 MG PO TABS
200.0000 mg | ORAL_TABLET | Freq: Three times a day (TID) | ORAL | Status: AC
  Administered 2015-05-22: 200 mg via ORAL
  Filled 2015-05-22: qty 1

## 2015-05-22 NOTE — ED Notes (Signed)
Seen here today because facility thought patient pulled foley catheter out. Catheter flushed with clear yellow urine return, no bleeding or clotting noted, and foley intact.

## 2015-05-22 NOTE — ED Notes (Signed)
Bed: WA17 Expected date:  Expected time:  Means of arrival:  Comments: ems 

## 2015-05-22 NOTE — Discharge Instructions (Signed)
Urine Foley catheter in place for urinary retention. This was supposed been removed last month. Please follow-up with a urologist for a trial to void.

## 2015-05-22 NOTE — ED Provider Notes (Signed)
CSN: [id number]     Arrival date & time 05/22/15  0750 History   First MD Initiated Contact with Patient 05/22/15 0754     Chief Complaint  Patient presents with  . Foley Catheter Pain      (Consider location/radiation/quality/duration/timing/severity/associated sxs/prior Treatment) Patient is a 81 y.o. male presenting with male genitourinary complaint. The history is provided by the patient and the nursing home.  Male GU Problem Presenting symptoms: penile pain   Context: after injury (pulling on catheter)   Relieved by:  Nothing Worsened by:  Nothing tried Ineffective treatments:  None tried   Level V caveat dementia. 81 yo M with a chief complaint of Foley catheter pain. The started this morning. Nursing home thinks that he pulled on his catheter. Patient had a Foley placed a couple months ago for urinary retention. Was posterior removed right after his hospitalization. However patient never followed up with urology.  Past Medical History  Diagnosis Date  . Diabetes mellitus without complication ([place])   . Hypertension   . Hearing difficulty of both ears     Bilateral hearing aids  . Blindness     Severe visual impairment OD, Blind OS  . Abnormality of gait   . Peripheral edema   . Glaucoma   . Dyslipidemia   . Nontraumatic cerebral hemorrhage ([place])   . UTI (urinary tract infection)   . Generalized muscle weakness   . Renal disorder   . Dysphagia   . Cognitive communication deficit   . Encephalopathy   . Urinary retention   . Neoplasm of prostate   . Renal failure    Past Surgical History  Procedure Laterality Date  . Eye surgery     Family History  Problem Relation Age of Onset  . Hypertension Mother   . Hypertension Father   . Diabetes Mellitus II Brother    Social History  Substance Use Topics  . Smoking status: Former Research scientist (life sciences)  . Smokeless tobacco: None  . Alcohol Use: No    Review of Systems  Unable to perform ROS: Dementia  Genitourinary:  Positive for penile pain.      Allergies  Review of patient's allergies indicates no known allergies.  Home Medications   Prior to Admission medications   Medication Sig Start Date End Date Taking? Authorizing Provider  acetaminophen (TYLENOL) 325 MG tablet Take 2 tablets (650 mg total) by mouth every 6 (six) hours as needed for mild pain (or Fever >/= 101). 04/05/15   [name], MD  atorvastatin (LIPITOR) 10 MG tablet Take 10 mg by mouth at bedtime.    Historical Provider, MD  brimonidine (ALPHAGAN) 0.2 % ophthalmic solution Place 1 drop into both eyes 3 (three) times daily.    Historical Provider, MD  carvedilol (COREG) 6.25 MG tablet Take 6.25 mg by mouth 2 (two) times daily. With food. 08/23/14   Historical Provider, MD  dorzolamide-timolol (COSOPT) 22.3-6.8 MG/ML ophthalmic solution Place 1 drop into the right eye 2 (two) times daily.    Historical Provider, MD  feeding supplement, ENSURE ENLIVE, (ENSURE ENLIVE) LIQD Take 237 mLs by mouth 2 (two) times daily between meals. Patient not taking: Reported on 05/21/2015 03/22/15   [name], MD  finasteride (PROSCAR) 5 MG tablet Take 5 mg by mouth daily. 10/11/14   Historical Provider, MD  mirtazapine (REMERON) 15 MG tablet Take 15 mg by mouth at bedtime.    Historical Provider, MD  senna-docusate (SENOKOT-S) 8.6-50 MG per tablet Take 1 tablet by  mouth at bedtime as needed for mild constipation. 03/22/15   [name], MD  verapamil (CALAN-SR) 240 MG CR tablet Take 240 mg by mouth at bedtime.    Historical Provider, MD   BP 156/78 mmHg  Pulse 66  Temp(Src) 97.4 F (36.3 C) (Oral)  Resp 16  SpO2 99% Physical Exam  Constitutional: He appears well-developed and well-nourished.  HENT:  Head: Normocephalic and atraumatic.  Eyes: EOM are normal. Pupils are equal, round, and reactive to light.  Neck: Normal range of motion. Neck supple. No JVD present.  Cardiovascular: Normal rate and regular rhythm.  Exam reveals no gallop and no friction  rub.   No murmur heard. Pulmonary/Chest: No respiratory distress. He has no wheezes.  Abdominal: He exhibits no distension. There is no tenderness. There is no rebound and no guarding. Hernia confirmed negative in the right inguinal area and confirmed negative in the left inguinal area.  Genitourinary: Testes normal. Circumcised.  Foley catheter in place draining clear urine no noted blood clots or hematuria grossly.  Musculoskeletal: Normal range of motion.  Lymphadenopathy:       Right: No inguinal adenopathy present.       Left: No inguinal adenopathy present.  Neurological: He is alert.  Skin: No rash noted. No pallor.  Psychiatric: He has a normal mood and affect. His behavior is normal.  Nursing note and vitals reviewed.   ED Course  Procedures (including critical care time) Labs Review Labs Reviewed - No data to display  Imaging Review Ct Head Wo Contrast  05/21/2015  CLINICAL DATA:  Fall, frontal hematoma EXAM: CT HEAD WITHOUT CONTRAST CT CERVICAL SPINE WITHOUT CONTRAST TECHNIQUE: Multidetector CT imaging of the head and cervical spine was performed following the standard protocol without intravenous contrast. Multiplanar CT image reconstructions of the cervical spine were also generated. COMPARISON:  02/25/2013 FINDINGS: CT HEAD FINDINGS No skull fracture is noted. There is scalp swelling and small scalp hematoma midline frontal region. No intracranial hemorrhage, mass effect or midline shift. Stable atrophy and chronic white matter disease. No acute cortical infarction. No mass lesion is noted on this unenhanced scan. Mild scalp swelling midline posterior parietal region axial image 25. CT CERVICAL SPINE FINDINGS Axial images of the cervical spine shows no acute fracture or subluxation. Computer processed images shows extensive degenerative changes C1-C2 articulation. There is anterior spurring lower endplate of C3 vertebral body. Mild disc space flattening with anterior spurring at  C4-C5 level. Disc space flattening with anterior spurring at C5-C6 level and C6-C7 level. Disc space flattening with anterior spurring at C7-T1 level. No prevertebral soft tissue swelling. Cervical airway is patent. There is no pneumothorax in visualized lung apices. Bilateral apical scarring. IMPRESSION: 1. No acute intracranial abnormality. There is scalp swelling and small hematoma midline frontal region. Small scalp swelling midline posterior parietal region. Stable atrophy and chronic white matter disease. 2. No cervical spine acute fracture or subluxation. Multilevel degenerative changes as described above. Electronically Signed   By: Lahoma Crocker M.D.   On: 05/21/2015 09:40   Ct Cervical Spine Wo Contrast  05/21/2015  CLINICAL DATA:  Fall, frontal hematoma EXAM: CT HEAD WITHOUT CONTRAST CT CERVICAL SPINE WITHOUT CONTRAST TECHNIQUE: Multidetector CT imaging of the head and cervical spine was performed following the standard protocol without intravenous contrast. Multiplanar CT image reconstructions of the cervical spine were also generated. COMPARISON:  02/25/2013 FINDINGS: CT HEAD FINDINGS No skull fracture is noted. There is scalp swelling and small scalp hematoma midline frontal region. No  intracranial hemorrhage, mass effect or midline shift. Stable atrophy and chronic white matter disease. No acute cortical infarction. No mass lesion is noted on this unenhanced scan. Mild scalp swelling midline posterior parietal region axial image 25. CT CERVICAL SPINE FINDINGS Axial images of the cervical spine shows no acute fracture or subluxation. Computer processed images shows extensive degenerative changes C1-C2 articulation. There is anterior spurring lower endplate of C3 vertebral body. Mild disc space flattening with anterior spurring at C4-C5 level. Disc space flattening with anterior spurring at C5-C6 level and C6-C7 level. Disc space flattening with anterior spurring at C7-T1 level. No prevertebral soft  tissue swelling. Cervical airway is patent. There is no pneumothorax in visualized lung apices. Bilateral apical scarring. IMPRESSION: 1. No acute intracranial abnormality. There is scalp swelling and small hematoma midline frontal region. Small scalp swelling midline posterior parietal region. Stable atrophy and chronic white matter disease. 2. No cervical spine acute fracture or subluxation. Multilevel degenerative changes as described above. Electronically Signed   By: Lahoma Crocker M.D.   On: 05/21/2015 09:40   I have personally reviewed and evaluated these images and lab results as part of my medical decision-making.   EKG Interpretation None      MDM   Final diagnoses:  Foley catheter problem, initial encounter [facility])    81 yo M with pain from his Foley catheter. Having significant pain on my exam. Foley draining normally. Tested by nursing staff with intact placement no noted blood clots. Will not replace Foley as patient has a 20 coude in place currently. Will not check a UA is not replacing the Foley. Stressed the patient needs to follow-up with urology in the discharge instructions as this is the nursing home responsibility to get him there. Patient was supposed to have a trial to void 3 months ago. Indwelling Foley to be dangerous for this patient with dementia as a source of infection.  8:16 AM:  I have discussed the diagnosis/risks/treatment options with the patient and believe the pt to be eligible for discharge home to follow-up with Urology. We also discussed returning to the ED immediately if new or worsening sx occur. We discussed the sx which are most concerning (e.g., sudden worsening pain, fever, inability to tolerate by mouth) that necessitate immediate return. Medications administered to the patient during their visit and any new prescriptions provided to the patient are listed below.  Medications given during this visit Medications  phenazopyridine (PYRIDIUM) tablet 200 mg  (not administered)    New Prescriptions   No medications on file    The patient appears reasonably screen and/or stabilized for discharge and I doubt any other medical condition or other [facility] requiring further screening, evaluation, or treatment in the ED at this time prior to discharge.      Deno Etienne, DO 05/22/15 [phone]

## 2015-05-22 NOTE — ED Notes (Signed)
Per EMS staff called out because patient "pulled catheter out and the patient was in severe pain" Upon EMS arrival, patient denied pain, no blood in catheter bag Patient from [facility].

## 2015-07-04 ENCOUNTER — Encounter (HOSPITAL_COMMUNITY): Payer: Self-pay | Admitting: Emergency Medicine

## 2015-07-04 ENCOUNTER — Emergency Department (HOSPITAL_COMMUNITY)
Admission: EM | Admit: 2015-07-04 | Discharge: 2015-07-04 | Disposition: A | Discharge status: Home / Self Care | Payer: Medicare Other | Attending: Emergency Medicine | Admitting: Emergency Medicine

## 2015-07-04 ENCOUNTER — Emergency Department (HOSPITAL_COMMUNITY): Payer: Medicare Other

## 2015-07-04 DIAGNOSIS — Z87891 Personal history of nicotine dependence: Secondary | ICD-10-CM | POA: Diagnosis not present

## 2015-07-04 DIAGNOSIS — I10 Essential (primary) hypertension: Secondary | ICD-10-CM | POA: Diagnosis not present

## 2015-07-04 DIAGNOSIS — H540 Blindness, both eyes: Secondary | ICD-10-CM | POA: Diagnosis not present

## 2015-07-04 DIAGNOSIS — Z87448 Personal history of other diseases of urinary system: Secondary | ICD-10-CM | POA: Diagnosis not present

## 2015-07-04 DIAGNOSIS — H409 Unspecified glaucoma: Secondary | ICD-10-CM | POA: Insufficient documentation

## 2015-07-04 DIAGNOSIS — Z79899 Other long term (current) drug therapy: Secondary | ICD-10-CM | POA: Insufficient documentation

## 2015-07-04 DIAGNOSIS — F0390 Unspecified dementia without behavioral disturbance: Secondary | ICD-10-CM | POA: Diagnosis not present

## 2015-07-04 DIAGNOSIS — E119 Type 2 diabetes mellitus without complications: Secondary | ICD-10-CM | POA: Insufficient documentation

## 2015-07-04 DIAGNOSIS — H9193 Unspecified hearing loss, bilateral: Secondary | ICD-10-CM | POA: Insufficient documentation

## 2015-07-04 DIAGNOSIS — N390 Urinary tract infection, site not specified: Secondary | ICD-10-CM | POA: Diagnosis not present

## 2015-07-04 DIAGNOSIS — E785 Hyperlipidemia, unspecified: Secondary | ICD-10-CM | POA: Diagnosis not present

## 2015-07-04 DIAGNOSIS — Z8546 Personal history of malignant neoplasm of prostate: Secondary | ICD-10-CM | POA: Insufficient documentation

## 2015-07-04 DIAGNOSIS — R4182 Altered mental status, unspecified: Secondary | ICD-10-CM | POA: Diagnosis present

## 2015-07-04 LAB — CBC WITH DIFFERENTIAL/PLATELET
BASOS PCT: 0 %
Basophils Absolute: 0 10*3/uL (ref 0.0–0.1)
Eosinophils Absolute: 0.1 10*3/uL (ref 0.0–0.7)
Eosinophils Relative: 1 %
HEMATOCRIT: 34.8 % — AB (ref 39.0–52.0)
Hemoglobin: 11.5 g/dL — ABNORMAL LOW (ref 13.0–17.0)
Lymphocytes Relative: 15 %
Lymphs Abs: 1.4 10*3/uL (ref 0.7–4.0)
MCH: 28.6 pg (ref 26.0–34.0)
MCHC: 33 g/dL (ref 30.0–36.0)
MCV: 86.6 fL (ref 78.0–100.0)
MONO ABS: 1 10*3/uL (ref 0.1–1.0)
MONOS PCT: 11 %
NEUTROS ABS: 6.5 10*3/uL (ref 1.7–7.7)
Neutrophils Relative %: 73 %
Platelets: 282 10*3/uL (ref 150–400)
RBC: 4.02 MIL/uL — ABNORMAL LOW (ref 4.22–5.81)
RDW: 15.1 % (ref 11.5–15.5)
WBC: 9 10*3/uL (ref 4.0–10.5)

## 2015-07-04 LAB — URINALYSIS, ROUTINE W REFLEX MICROSCOPIC
Bilirubin Urine: NEGATIVE
Glucose, UA: NEGATIVE mg/dL
KETONES UR: 15 mg/dL — AB
NITRITE: NEGATIVE
PROTEIN: NEGATIVE mg/dL
Specific Gravity, Urine: 1.022 (ref 1.005–1.030)
pH: 6 (ref 5.0–8.0)

## 2015-07-04 LAB — I-STAT TROPONIN, ED: TROPONIN I, POC: 0.02 ng/mL (ref 0.00–0.08)

## 2015-07-04 LAB — COMPREHENSIVE METABOLIC PANEL
ALBUMIN: 3.5 g/dL (ref 3.5–5.0)
ALT: 9 U/L — ABNORMAL LOW (ref 17–63)
ANION GAP: 7 (ref 5–15)
AST: 41 U/L (ref 15–41)
Alkaline Phosphatase: 341 U/L — ABNORMAL HIGH (ref 38–126)
BILIRUBIN TOTAL: 0.6 mg/dL (ref 0.3–1.2)
BUN: 21 mg/dL — ABNORMAL HIGH (ref 6–20)
CO2: 25 mmol/L (ref 22–32)
Calcium: 9.4 mg/dL (ref 8.9–10.3)
Chloride: 107 mmol/L (ref 101–111)
Creatinine, Ser: 1.57 mg/dL — ABNORMAL HIGH (ref 0.61–1.24)
GFR calc Af Amer: 44 mL/min — ABNORMAL LOW (ref >60)
GFR, EST NON AFRICAN AMERICAN: 38 mL/min — AB (ref >60)
GLUCOSE: 111 mg/dL — AB (ref 65–99)
POTASSIUM: 3.5 mmol/L (ref 3.5–5.1)
Sodium: 139 mmol/L (ref 135–145)
TOTAL PROTEIN: 7.6 g/dL (ref 6.5–8.1)

## 2015-07-04 LAB — CBG MONITORING, ED: Glucose-Capillary: 84 mg/dL (ref 65–99)

## 2015-07-04 LAB — URINE MICROSCOPIC-ADD ON

## 2015-07-04 LAB — LIPASE, BLOOD: LIPASE: 66 U/L — AB (ref 11–51)

## 2015-07-04 LAB — I-STAT CG4 LACTIC ACID, ED: LACTIC ACID, VENOUS: 2.14 mmol/L — AB (ref 0.5–2.0)

## 2015-07-04 MED ORDER — CEPHALEXIN 500 MG PO CAPS: 500.0000 mg | ORAL_CAPSULE | Freq: Two times a day (BID) | ORAL | Status: DC

## 2015-07-04 MED ORDER — CEPHALEXIN 500 MG PO CAPS
500.0000 mg | ORAL_CAPSULE | Freq: Once | ORAL | Status: AC
  Administered 2015-07-04: 500 mg via ORAL
  Filled 2015-07-04: qty 1

## 2015-07-04 MED ORDER — SODIUM CHLORIDE 0.9 % IV BOLUS (SEPSIS)
1000.0000 mL | Freq: Once | INTRAVENOUS | Status: AC
  Administered 2015-07-04: 1000 mL via INTRAVENOUS

## 2015-07-04 NOTE — ED Notes (Signed)
Bed: [id number] Expected date:  Expected time:  Means of arrival:  Comments: 13M AMS x 2 days, found on floor

## 2015-07-04 NOTE — Discharge Instructions (Signed)
Urinary Tract Infection Mr. Kent Mayo, your work up today shows a urine infection.  See a primary care doctor within 3 days for close follow up.  If any symptoms worsen, come back to the ED immediately. Thank you A urinary tract infection (UTI) can occur any place along the urinary tract. The tract includes the kidneys, ureters, bladder, and urethra. A type of germ called bacteria often causes a UTI. UTIs are often helped with antibiotic medicine.  HOME CARE   If given, take antibiotics as told by your doctor. Finish them even if you start to feel better.  Drink enough fluids to keep your pee (urine) clear or pale yellow.  Avoid tea, drinks with caffeine, and bubbly (carbonated) drinks.  Pee often. Avoid holding your pee in for a long time.  Pee before and after having sex (intercourse).  Wipe from front to back after you poop (bowel movement) if you are a woman. Use each tissue only once. GET HELP RIGHT AWAY IF:   You have back pain.  You have lower belly (abdominal) pain.  You have chills.  You feel sick to your stomach (nauseous).  You throw up (vomit).  Your burning or discomfort with peeing does not go away.  You have a fever.  Your symptoms are not better in 3 days. MAKE SURE YOU:   Understand these instructions.  Will watch your condition.  Will get help right away if you are not doing well or get worse.   This information is not intended to replace advice given to you by your health care provider. Make sure you discuss any questions you have with your health care provider.   Document Released: 01/06/2008 Document Revised: 08/10/2014 Document Reviewed: 02/18/2012 Elsevier Interactive Patient Education [employer].

## 2015-07-04 NOTE — ED Notes (Signed)
Writer unable to In and Out cath on pt, Probation officer notified EDP, applied condom catheter to pt.

## 2015-07-04 NOTE — ED Notes (Signed)
Notified EDP,Oni,MD., pt. i-stat CG4 Lactic Acid result 2.14.

## 2015-07-04 NOTE — ED Provider Notes (Signed)
CSN: [id number]     Arrival date & time 07/04/15  0046 History  By signing my name below, I, Kent Mayo, attest that this documentation has been prepared under the direction and in the presence of Kent Balls, MD. Electronically Signed: Meriel Mayo, ED Scribe. 07/04/2015. 2:25 AM.   Chief Complaint  Patient presents with  . Altered Mental Status   The history is provided by the EMS personnel and the nursing home. No language interpreter was used.   LEVEL 5 CAVEAT: DEMENTIA  HPI Comments: Kent Mayo is a 81 y.o. male, with a h/o dementia, DM, HTN, OU blindness, and renal failure, brought in by ambulance, who presents to the Emergency Department from [facility] for evaluation of AMS X 4 days. Staff at the nursing home reported to EMS that the pt has been crawling around on the floor and speaking incoherently for the past 4 days. The pt recently had a foley catheter removed several days ago and has been urinating without difficulty since removal, per nursing home staff. He was hospitalized 4 months ago for acute renal failure. Staff at nursing home denies any recent fevers or infection. Pt denies any pain. No other details are known.   Past Medical History  Diagnosis Date  . Diabetes mellitus without complication ([place])   . Hypertension   . Hearing difficulty of both ears     Bilateral hearing aids  . Blindness     Severe visual impairment OD, Blind OS  . Abnormality of gait   . Peripheral edema   . Glaucoma   . Dyslipidemia   . Nontraumatic cerebral hemorrhage ([place])   . UTI (urinary tract infection)   . Generalized muscle weakness   . Renal disorder   . Dysphagia   . Cognitive communication deficit   . Encephalopathy   . Urinary retention   . Neoplasm of prostate   . Renal failure    Past Surgical History  Procedure Laterality Date  . Eye surgery     Family History  Problem Relation Age of Onset  . Hypertension Mother   . Hypertension Father    . Diabetes Mellitus II Brother    Social History  Substance Use Topics  . Smoking status: Former Research scientist (life sciences)  . Smokeless tobacco: None  . Alcohol Use: No    Review of Systems  Unable to perform ROS: Dementia   Allergies  Review of patient's allergies indicates no known allergies.  Home Medications   Prior to Admission medications   Medication Sig Start Date End Date Taking? Authorizing Provider  acetaminophen (TYLENOL) 325 MG tablet Take 2 tablets (650 mg total) by mouth every 6 (six) hours as needed for mild pain (or Fever >/= 101). 04/05/15   [name], MD  atorvastatin (LIPITOR) 10 MG tablet Take 10 mg by mouth at bedtime.    Historical Provider, MD  brimonidine (ALPHAGAN) 0.2 % ophthalmic solution Place 1 drop into both eyes 3 (three) times daily.    Historical Provider, MD  carvedilol (COREG) 6.25 MG tablet Take 6.25 mg by mouth 2 (two) times daily. With food. 08/23/14   Historical Provider, MD  dorzolamide-timolol (COSOPT) 22.3-6.8 MG/ML ophthalmic solution Place 1 drop into the right eye 2 (two) times daily.    Historical Provider, MD  feeding supplement, ENSURE ENLIVE, (ENSURE ENLIVE) LIQD Take 237 mLs by mouth 2 (two) times daily between meals. Patient not taking: Reported on 05/21/2015 03/22/15   [name], MD  finasteride (PROSCAR) 5  MG tablet Take 5 mg by mouth daily. 10/11/14   Historical Provider, MD  mirtazapine (REMERON) 15 MG tablet Take 15 mg by mouth at bedtime.    Historical Provider, MD  senna-docusate (SENOKOT-S) 8.6-50 MG per tablet Take 1 tablet by mouth at bedtime as needed for mild constipation. 03/22/15   [name], MD  verapamil (CALAN-SR) 240 MG CR tablet Take 240 mg by mouth at bedtime.    Historical Provider, MD   BP 130/64 mmHg  Pulse 62  Temp(Src) 97.7 F (36.5 C) (Oral)  Resp 17  SpO2 98% Physical Exam  Constitutional: He is oriented to person, place, and time. Vital signs are normal. He appears well-developed and well-nourished.  Non-toxic  appearance. He does not appear ill. No distress.  HENT:  Head: Normocephalic and atraumatic.  Nose: Nose normal.  Mouth/Throat: Oropharynx is clear and moist. No oropharyngeal exudate.  Eyes: Conjunctivae and EOM are normal. No scleral icterus.  Cloudy pupil on the left, irregular pupil on the right.   Neck: Normal range of motion. Neck supple. No tracheal deviation, no edema, no erythema and normal range of motion present. No thyroid mass and no thyromegaly present.  Cardiovascular: Normal rate, regular rhythm, S1 normal, S2 normal, normal heart sounds, intact distal pulses and normal pulses.  Exam reveals no gallop and no friction rub.   No murmur heard. Pulmonary/Chest: Effort normal and breath sounds normal. No respiratory distress. He has no wheezes. He has no rhonchi. He has no rales.  Abdominal: Soft. Normal appearance and bowel sounds are normal. He exhibits no distension, no ascites and no mass. There is no hepatosplenomegaly. There is no tenderness. There is no rebound, no guarding and no CVA tenderness.  Musculoskeletal: Normal range of motion. He exhibits no edema or tenderness.  Lymphadenopathy:    He has no cervical adenopathy.  Neurological: He is alert and oriented to person, place, and time. He has normal strength. No cranial nerve deficit or sensory deficit. He exhibits normal muscle tone.  Skin: Skin is warm, dry and intact. No petechiae and no rash noted. He is not diaphoretic. No erythema. No pallor.  Nursing note and vitals reviewed.   ED Course  Procedures  DIAGNOSTIC STUDIES: Oxygen Saturation is 98% on RA, normal by my interpretation.    COORDINATION OF CARE: 2:24 AM Discussed treatment plan with pt. Pt acknowledges and agrees to plan.   Labs Review Labs Reviewed  CBC WITH DIFFERENTIAL/PLATELET - Abnormal; Notable for the following:    RBC 4.02 (*)    Hemoglobin 11.5 (*)    HCT 34.8 (*)    All other components within normal limits  COMPREHENSIVE METABOLIC  PANEL - Abnormal; Notable for the following:    Glucose, Bld 111 (*)    BUN 21 (*)    Creatinine, Ser 1.57 (*)    ALT 9 (*)    Alkaline Phosphatase 341 (*)    GFR calc non Af Amer 38 (*)    GFR calc Af Amer 44 (*)    All other components within normal limits  LIPASE, BLOOD - Abnormal; Notable for the following:    Lipase 66 (*)    All other components within normal limits  URINALYSIS, ROUTINE W REFLEX MICROSCOPIC (NOT AT [facility]) - Abnormal; Notable for the following:    APPearance CLOUDY (*)    Hgb urine dipstick LARGE (*)    Ketones, ur 15 (*)    Leukocytes, UA MODERATE (*)    All other components within normal  limits  URINE MICROSCOPIC-ADD ON - Abnormal; Notable for the following:    Squamous Epithelial / LPF 0-5 (*)    Bacteria, UA MANY (*)    All other components within normal limits  I-STAT CG4 LACTIC ACID, ED - Abnormal; Notable for the following:    Lactic Acid, Venous 2.14 (*)    All other components within normal limits  URINE CULTURE  I-STAT TROPOININ, ED  CBG MONITORING, ED    Imaging Review Dg Chest 2 View  07/04/2015  CLINICAL DATA:  Found on floor.  Altered mental status. EXAM: CHEST  2 VIEW COMPARISON:  03/19/2015 FINDINGS: The heart size and mediastinal contours are within normal limits. Both lungs are clear. The visualized skeletal structures are unremarkable. IMPRESSION: No active cardiopulmonary disease. Electronically Signed   By: Andreas Newport M.D.   On: 07/04/2015 01:54   Ct Head Wo Contrast  07/04/2015  CLINICAL DATA:  Altered mental status. EXAM: CT HEAD WITHOUT CONTRAST TECHNIQUE: Contiguous axial images were obtained from the base of the skull through the vertex without intravenous contrast. COMPARISON:  05/21/2015 FINDINGS: There is no intracranial hemorrhage, mass or evidence of acute infarction. There is moderate generalized atrophy. There is periventricular hypodensity consistent with chronic small vessel disease. No acute findings are evident. No  interval change is evident. No bone abnormalities evident. Visible paranasal sinuses are clear. IMPRESSION: No acute intracranial findings. There is moderate generalized atrophy and chronic small vessel disease. Electronically Signed   By: Andreas Newport M.D.   On: 07/04/2015 01:57   I have personally reviewed and evaluated these images and lab results as part of my medical decision-making.   EKG Interpretation None      MDM   Final diagnoses:  None   Patient presents to the ED for AMS and being found down.  ED work up reveals urine infection as possible source of his problems.  He was given keflex for treatment and will be sent home with Rx.  He otherwise appears well and in NAD.  VS remain within his normal limits and he is safe for DC.   I personally performed the services described in this documentation, which was scribed in my presence. The recorded information has been reviewed and is accurate.      Kent Balls, MD 07/04/15 [phone]

## 2015-07-04 NOTE — ED Notes (Signed)
[facility] presents with a 81 yo male from [facility] with [place].  Staff reports the past four days pt has been crawling on the floor and speaking incoherently.  Hx of dementia.  No recent infections or fever.  Had foley catheter removal several days ago but has been urinating fine per staff.  Pt oriented to self.  Staff sent pt because they were concerned pt is experiencing Sundowner's.  HPOA notified by facility staff.

## 2015-07-07 ENCOUNTER — Telehealth (HOSPITAL_COMMUNITY): Payer: Self-pay

## 2015-07-07 LAB — URINE CULTURE

## 2015-07-07 NOTE — Progress Notes (Signed)
ED Antimicrobial Stewardship Positive Culture Follow Up   Kent Mayo is an 81 y.o. male who presented to [facility] on 07/04/2015 with a chief complaint of  Chief Complaint  Patient presents with  . Altered Mental Status    Recent Results (from the past 720 hour(s))  Urine culture     Status: None   Collection Time: 07/04/15  4:23 AM  Result Value Ref Range Status   Specimen Description URINE, CLEAN CATCH  Final   Special Requests NONE  Final   Culture   Final    >=100,000 COLONIES/mL AEROCOCCUS URINAE >=100,000 COLONIES/mL ENTEROCOCCUS SPECIES Performed at [facility]    Report Status 07/06/2015 FINAL  Final   Organism ID, Bacteria ENTEROCOCCUS SPECIES  Final      Susceptibility   Enterococcus species - MIC*    AMPICILLIN <=2 SENSITIVE Sensitive     LEVOFLOXACIN >=8 RESISTANT Resistant     NITROFURANTOIN <=16 SENSITIVE Sensitive     VANCOMYCIN 1 SENSITIVE Sensitive     * >=100,000 COLONIES/mL ENTEROCOCCUS SPECIES    [x]  Treated with cephalexin, organism resistant to prescribed antimicrobial []  Patient discharged originally without antimicrobial agent and treatment is now indicated  New antibiotic prescription: Amoxicillin 500 mg BID x 1 week  ED Provider: Carlisle Cater, PA-C   Wynell Balloon 07/07/2015, 9:24 AM Infectious Diseases Pharmacist Phone# [phone]

## 2015-07-07 NOTE — Telephone Encounter (Signed)
Post ED Visit - Positive Culture Follow-up: Chart Hand-off to ED Flow Manager  Culture assessed and recommendations reviewed by: [x]  Levester Fresh, Pharm.D., BCPS []  Heide Guile, Pharm.D., BCPS-AQ ID []  Alycia Rossetti, Pharm.D., BCPS []  [place], Pharm.D., BCPS, AAHIVP []  Legrand Como, Pharm .D., BCPS, AAHIVP []  Milus Glazier, Pharm.D. []  Dimitri Ped, Pharm.D.  Positive urine culture  []  Patient discharged without antimicrobial prescription and treatment is now indicated [x]  Organism is resistant to prescribed ED discharge antimicrobial []  Patient with positive blood cultures  Changes discussed with ED provider: Alecia Lemming New antibiotic prescription DC Keflex and start Amoxicillin 500mg  po bid x 1 week.  Attempting to contact. Letter sent to address on file requesting call back.    Ileene Musa 07/07/2015, 11:20 AM

## 2015-07-15 ENCOUNTER — Emergency Department (HOSPITAL_COMMUNITY)
Admission: EM | Admit: 2015-07-15 | Discharge: 2015-07-16 | Disposition: A | Discharge status: Home / Self Care | Payer: Medicare Other | Attending: Emergency Medicine | Admitting: Emergency Medicine

## 2015-07-15 ENCOUNTER — Encounter (HOSPITAL_COMMUNITY): Payer: Self-pay | Admitting: Family Medicine

## 2015-07-15 DIAGNOSIS — Z043 Encounter for examination and observation following other accident: Secondary | ICD-10-CM | POA: Insufficient documentation

## 2015-07-15 DIAGNOSIS — Z79899 Other long term (current) drug therapy: Secondary | ICD-10-CM | POA: Insufficient documentation

## 2015-07-15 DIAGNOSIS — N390 Urinary tract infection, site not specified: Secondary | ICD-10-CM | POA: Insufficient documentation

## 2015-07-15 DIAGNOSIS — Y998 Other external cause status: Secondary | ICD-10-CM | POA: Diagnosis not present

## 2015-07-15 DIAGNOSIS — Y9389 Activity, other specified: Secondary | ICD-10-CM | POA: Diagnosis not present

## 2015-07-15 DIAGNOSIS — Y9289 Other specified places as the place of occurrence of the external cause: Secondary | ICD-10-CM | POA: Insufficient documentation

## 2015-07-15 DIAGNOSIS — Z87891 Personal history of nicotine dependence: Secondary | ICD-10-CM | POA: Insufficient documentation

## 2015-07-15 DIAGNOSIS — H540 Blindness, both eyes: Secondary | ICD-10-CM | POA: Diagnosis not present

## 2015-07-15 DIAGNOSIS — Z8546 Personal history of malignant neoplasm of prostate: Secondary | ICD-10-CM | POA: Insufficient documentation

## 2015-07-15 DIAGNOSIS — W1839XA Other fall on same level, initial encounter: Secondary | ICD-10-CM | POA: Insufficient documentation

## 2015-07-15 DIAGNOSIS — F0390 Unspecified dementia without behavioral disturbance: Secondary | ICD-10-CM | POA: Diagnosis not present

## 2015-07-15 DIAGNOSIS — H9193 Unspecified hearing loss, bilateral: Secondary | ICD-10-CM | POA: Diagnosis not present

## 2015-07-15 DIAGNOSIS — E119 Type 2 diabetes mellitus without complications: Secondary | ICD-10-CM | POA: Insufficient documentation

## 2015-07-15 DIAGNOSIS — I10 Essential (primary) hypertension: Secondary | ICD-10-CM | POA: Diagnosis not present

## 2015-07-15 DIAGNOSIS — Z8659 Personal history of other mental and behavioral disorders: Secondary | ICD-10-CM

## 2015-07-15 DIAGNOSIS — E785 Hyperlipidemia, unspecified: Secondary | ICD-10-CM | POA: Insufficient documentation

## 2015-07-15 DIAGNOSIS — W19XXXA Unspecified fall, initial encounter: Secondary | ICD-10-CM

## 2015-07-15 HISTORY — DX: Hyperlipidemia, unspecified: E78.5

## 2015-07-15 NOTE — ED Notes (Signed)
Bed: [id number] Expected date:  Expected time:  Means of arrival:  Comments: Possible UTI

## 2015-07-15 NOTE — ED Notes (Signed)
Patient is from [facility] [place] and transported via [facility]. Per EMS, facility reported the room-mate witnessed patient falling. Facility found patient lying on the floor. Denies any pain. Also, facility reported patient is more confused today than normal and diagnosised with UTI once week ago.

## 2015-07-16 DIAGNOSIS — Z043 Encounter for examination and observation following other accident: Secondary | ICD-10-CM | POA: Diagnosis not present

## 2015-07-16 LAB — URINALYSIS, ROUTINE W REFLEX MICROSCOPIC
GLUCOSE, UA: NEGATIVE mg/dL
KETONES UR: NEGATIVE mg/dL
Nitrite: NEGATIVE
PROTEIN: NEGATIVE mg/dL
Specific Gravity, Urine: 1.029 (ref 1.005–1.030)
pH: 5 (ref 5.0–8.0)

## 2015-07-16 LAB — URINE MICROSCOPIC-ADD ON

## 2015-07-16 MED ORDER — NITROFURANTOIN MONOHYD MACRO 100 MG PO CAPS
100.0000 mg | ORAL_CAPSULE | Freq: Once | ORAL | Status: AC
  Administered 2015-07-16: 100 mg via ORAL
  Filled 2015-07-16: qty 1

## 2015-07-16 MED ORDER — NITROFURANTOIN MONOHYD MACRO 100 MG PO CAPS: 100.0000 mg | ORAL_CAPSULE | Freq: Two times a day (BID) | ORAL | Status: DC

## 2015-07-16 NOTE — ED Provider Notes (Signed)
CSN: [id number]     Arrival date & time 07/15/15  2151 History   First MD Initiated Contact with Patient 07/16/15 0040     Chief Complaint  Patient presents with  . Fall     (Consider location/radiation/quality/duration/timing/severity/associated sxs/prior Treatment) Patient is a 81 y.o. male presenting with fall. The history is provided by the patient, the EMS personnel and the nursing home. No language interpreter was used.  Fall This is a new problem. The current episode started today. Associated symptoms comments: The patient is a resident at [facility] who was found on the floor of his room after a fall witnessed by the patient's roommate. He was awake and no LOC was reported. No vomiting since fall. He has a history dementia and is an unreliable historian. Per the nursing home staff the patient seemed to be slightly confused above baseline today. .    Past Medical History  Diagnosis Date  . Diabetes mellitus without complication ([place])   . Hypertension   . Hearing difficulty of both ears     Bilateral hearing aids  . Blindness     Severe visual impairment OD, Blind OS  . Abnormality of gait   . Peripheral edema   . Glaucoma   . Dyslipidemia   . 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 UTI (urinary tract infection)   . Generalized muscle weakness   . Renal disorder   . Dysphagia   . Cognitive communication deficit   . Encephalopathy   . Urinary retention   . Neoplasm of prostate   . Renal failure   . Hyperlipidemia    Past Surgical History  Procedure Laterality Date  . Eye surgery     Family History  Problem Relation Age of Onset  . Hypertension Mother   . Hypertension Father   . Diabetes Mellitus II Brother    Social History  Substance Use Topics  . Smoking status: Former Research scientist (life sciences)  . Smokeless tobacco: None  . Alcohol Use: No    Review of Systems  Unable to perform ROS: Dementia      Allergies  Review of patient's allergies  indicates no known allergies.  Home Medications   Prior to Admission medications   Medication Sig Start Date End Date Taking? Authorizing Provider  acetaminophen (TYLENOL) 650 MG CR tablet Take 650 mg by mouth every 8 (eight) hours.   Yes Historical Provider, MD  atorvastatin (LIPITOR) 10 MG tablet Take 10 mg by mouth at bedtime.   Yes Historical Provider, MD  brimonidine (ALPHAGAN) 0.2 % ophthalmic solution Place 1 drop into both eyes 3 (three) times daily.   Yes Historical Provider, MD  carvedilol (COREG) 6.25 MG tablet Take 6.25 mg by mouth 2 (two) times daily. With food. 08/23/14  Yes Historical Provider, MD  donepezil (ARICEPT) 10 MG tablet Take 10 mg by mouth at bedtime.   Yes Historical Provider, MD  dorzolamide-timolol (COSOPT) 22.3-6.8 MG/ML ophthalmic solution Place 1 drop into the right eye 2 (two) times daily.   Yes Historical Provider, MD  finasteride (PROSCAR) 5 MG tablet Take 5 mg by mouth daily. 10/11/14  Yes Historical Provider, MD  LORazepam (ATIVAN) 0.5 MG tablet Take 0.5 mg by mouth at bedtime as needed (insomnia).   Yes Historical Provider, MD  memantine (NAMENDA) 5 MG tablet Take 5 mg by mouth 2 (two) times daily.   Yes Historical Provider, MD  mirtazapine (REMERON) 15 MG tablet Take 15 mg by mouth at bedtime.   Yes  Historical Provider, MD  NUTRITIONAL SUPPLEMENT LIQD Take 1 Can by mouth 3 (three) times daily. Between meals. "Med Pass."   Yes Historical Provider, MD  senna-docusate (SENOKOT-S) 8.6-50 MG per tablet Take 1 tablet by mouth at bedtime as needed for mild constipation. 03/22/15  Yes [name], MD  traMADol (ULTRAM) 50 MG tablet Take 50 mg by mouth 2 (two) times daily.   Yes Historical Provider, MD  verapamil (CALAN-SR) 240 MG CR tablet Take 240 mg by mouth at bedtime.   Yes Historical Provider, MD  cephALEXin (KEFLEX) 500 MG capsule Take 1 capsule (500 mg total) by mouth 2 (two) times daily. Patient not taking: Reported on 07/15/2015 07/04/15   [name], MD   BP  141/77 mmHg  Pulse 71  Temp(Src) 97.9 F (36.6 C) (Oral)  Resp 16  SpO2 98% Physical Exam  Constitutional: He appears well-developed and well-nourished. No distress.  HENT:  Head: Normocephalic and atraumatic.  Neck: Normal range of motion. Neck supple.  Cardiovascular: Normal rate and regular rhythm.   Pulmonary/Chest: Effort normal and breath sounds normal. He has no wheezes. He exhibits no tenderness.  Abdominal: Soft. Bowel sounds are normal. There is no tenderness. There is no rebound and no guarding.  Musculoskeletal: Normal range of motion.  Neurological: He is alert.  Skin: Skin is warm and dry. No rash noted.  Psychiatric: He has a normal mood and affect.    ED Course  Procedures (including critical care time) Labs Review Labs Reviewed  URINALYSIS, ROUTINE W REFLEX MICROSCOPIC (NOT AT [facility]) - Abnormal; Notable for the following:    Color, Urine AMBER (*)    APPearance CLOUDY (*)    Hgb urine dipstick LARGE (*)    Bilirubin Urine SMALL (*)    Leukocytes, UA MODERATE (*)    All other components within normal limits  URINE MICROSCOPIC-ADD ON - Abnormal; Notable for the following:    Squamous Epithelial / LPF 0-5 (*)    Bacteria, UA FEW (*)    Crystals CA OXALATE CRYSTALS (*)    All other components within normal limits  URINE CULTURE    Imaging Review No results found. I have personally reviewed and evaluated these images and lab results as part of my medical decision-making.   EKG Interpretation None      MDM   Final diagnoses:  None    1. Fall 2. UTI  The patient is sleeping, easily awaken. No evidence of injury from fall. UA was performed given history of recent UTI and is found to be infected. Chart reviewed and urine culture 07/04/15 shows sensitivity to Macrobid. He was started on Macrobid in the ED. No fever, VS are stable. He can be discharged back to the nursing home for continued treatment.     Charlann Lange, PA-C 07/16/15 0424  Shanon Rosser, MD 07/16/15 [phone]

## 2015-07-16 NOTE — Discharge Instructions (Signed)

## 2015-07-16 NOTE — ED Notes (Signed)
Pt is incontinent, therefore unable to obtain a sample.

## 2015-07-18 LAB — URINE CULTURE: Culture: >=100000

## 2015-07-19 ENCOUNTER — Telehealth (HOSPITAL_COMMUNITY): Payer: Self-pay

## 2015-07-19 NOTE — Telephone Encounter (Signed)
Post ED Visit - Positive Culture Follow-up  Culture report reviewed by antimicrobial stewardship pharmacist:  [x]  Elenor Quinones, Pharm.D. []  Heide Guile, Pharm.D., BCPS []  Parks Neptune, Pharm.D. []  Alycia Rossetti, Pharm.D., BCPS []  Jackson, Pharm.D., BCPS, AAHIVP []  Legrand Como, Pharm.D., BCPS, AAHIVP []  Cassie Stewart, Pharm.D. []  Stephens November, Pharm.D.  Positive urine culture, >/= 100,000 colonies -> Enterococcus Species Treated with Nitrofurantoin, organism sensitive to the same and no further patient follow-up is required at this time.  Dortha Kern 07/19/2015, 9:36 AM

## 2015-07-21 ENCOUNTER — Telehealth (HOSPITAL_COMMUNITY): Payer: Self-pay

## 2015-07-21 NOTE — Telephone Encounter (Signed)
Unable to contact pt by mail or telephone. Unable to communicate lab results or treatment changes. 

## 2015-08-06 ENCOUNTER — Emergency Department (HOSPITAL_COMMUNITY): Payer: Medicare Other

## 2015-08-06 ENCOUNTER — Emergency Department (HOSPITAL_COMMUNITY)
Admission: EM | Admit: 2015-08-06 | Discharge: 2015-08-06 | Disposition: A | Discharge status: Home / Self Care | Payer: Medicare Other | Attending: Emergency Medicine | Admitting: Emergency Medicine

## 2015-08-06 ENCOUNTER — Encounter (HOSPITAL_COMMUNITY): Payer: Self-pay

## 2015-08-06 DIAGNOSIS — Z8739 Personal history of other diseases of the musculoskeletal system and connective tissue: Secondary | ICD-10-CM | POA: Diagnosis not present

## 2015-08-06 DIAGNOSIS — H540 Blindness, both eyes: Secondary | ICD-10-CM | POA: Diagnosis not present

## 2015-08-06 DIAGNOSIS — H409 Unspecified glaucoma: Secondary | ICD-10-CM | POA: Diagnosis not present

## 2015-08-06 DIAGNOSIS — R630 Anorexia: Secondary | ICD-10-CM | POA: Insufficient documentation

## 2015-08-06 DIAGNOSIS — Z79899 Other long term (current) drug therapy: Secondary | ICD-10-CM | POA: Insufficient documentation

## 2015-08-06 DIAGNOSIS — Z87448 Personal history of other diseases of urinary system: Secondary | ICD-10-CM | POA: Diagnosis not present

## 2015-08-06 DIAGNOSIS — E860 Dehydration: Secondary | ICD-10-CM | POA: Diagnosis not present

## 2015-08-06 DIAGNOSIS — R001 Bradycardia, unspecified: Secondary | ICD-10-CM | POA: Diagnosis not present

## 2015-08-06 DIAGNOSIS — Z8546 Personal history of malignant neoplasm of prostate: Secondary | ICD-10-CM | POA: Insufficient documentation

## 2015-08-06 DIAGNOSIS — Z87891 Personal history of nicotine dependence: Secondary | ICD-10-CM | POA: Insufficient documentation

## 2015-08-06 DIAGNOSIS — E785 Hyperlipidemia, unspecified: Secondary | ICD-10-CM | POA: Insufficient documentation

## 2015-08-06 DIAGNOSIS — I10 Essential (primary) hypertension: Secondary | ICD-10-CM | POA: Insufficient documentation

## 2015-08-06 DIAGNOSIS — H9193 Unspecified hearing loss, bilateral: Secondary | ICD-10-CM | POA: Diagnosis not present

## 2015-08-06 DIAGNOSIS — Z87440 Personal history of urinary (tract) infections: Secondary | ICD-10-CM | POA: Diagnosis not present

## 2015-08-06 DIAGNOSIS — E119 Type 2 diabetes mellitus without complications: Secondary | ICD-10-CM | POA: Insufficient documentation

## 2015-08-06 DIAGNOSIS — R05 Cough: Secondary | ICD-10-CM | POA: Diagnosis present

## 2015-08-06 DIAGNOSIS — R4182 Altered mental status, unspecified: Secondary | ICD-10-CM | POA: Diagnosis not present

## 2015-08-06 LAB — CBC WITH DIFFERENTIAL/PLATELET
BASOS ABS: 0 10*3/uL (ref 0.0–0.1)
BASOS PCT: 0 %
EOS ABS: 0.1 10*3/uL (ref 0.0–0.7)
Eosinophils Relative: 1 %
HCT: 34.6 % — ABNORMAL LOW (ref 39.0–52.0)
HEMOGLOBIN: 11.1 g/dL — AB (ref 13.0–17.0)
Lymphocytes Relative: 9 %
Lymphs Abs: 1.2 10*3/uL (ref 0.7–4.0)
MCH: 28.7 pg (ref 26.0–34.0)
MCHC: 32.1 g/dL (ref 30.0–36.0)
MCV: 89.4 fL (ref 78.0–100.0)
Monocytes Absolute: 1.1 10*3/uL — ABNORMAL HIGH (ref 0.1–1.0)
Monocytes Relative: 8 %
NEUTROS ABS: 11.2 10*3/uL — AB (ref 1.7–7.7)
NEUTROS PCT: 82 %
Platelets: 366 10*3/uL (ref 150–400)
RBC: 3.87 MIL/uL — AB (ref 4.22–5.81)
RDW: 16.1 % — ABNORMAL HIGH (ref 11.5–15.5)
WBC: 13.6 10*3/uL — AB (ref 4.0–10.5)

## 2015-08-06 LAB — COMPREHENSIVE METABOLIC PANEL
ALBUMIN: 2.9 g/dL — AB (ref 3.5–5.0)
ALK PHOS: 261 U/L — AB (ref 38–126)
ALT: 8 U/L — ABNORMAL LOW (ref 17–63)
ANION GAP: 9 (ref 5–15)
AST: 25 U/L (ref 15–41)
BUN: 24 mg/dL — ABNORMAL HIGH (ref 6–20)
CALCIUM: 9.5 mg/dL (ref 8.9–10.3)
CHLORIDE: 107 mmol/L (ref 101–111)
CO2: 26 mmol/L (ref 22–32)
Creatinine, Ser: 1.4 mg/dL — ABNORMAL HIGH (ref 0.61–1.24)
GFR calc Af Amer: 51 mL/min — ABNORMAL LOW (ref >60)
GFR calc non Af Amer: 44 mL/min — ABNORMAL LOW (ref >60)
GLUCOSE: 135 mg/dL — AB (ref 65–99)
Potassium: 4.1 mmol/L (ref 3.5–5.1)
SODIUM: 142 mmol/L (ref 135–145)
Total Bilirubin: 0.8 mg/dL (ref 0.3–1.2)
Total Protein: 7.5 g/dL (ref 6.5–8.1)

## 2015-08-06 LAB — AMMONIA: Ammonia: 18 umol/L (ref 9–35)

## 2015-08-06 LAB — POC OCCULT BLOOD, ED: FECAL OCCULT BLD: NEGATIVE

## 2015-08-06 LAB — I-STAT TROPONIN, ED: Troponin i, poc: 0.01 ng/mL (ref 0.00–0.08)

## 2015-08-06 MED ORDER — BOOST PLUS PO LIQD
237.0000 mL | Freq: Three times a day (TID) | ORAL | Status: DC
  Administered 2015-08-06: 237 mL via ORAL
  Filled 2015-08-06: qty 237

## 2015-08-06 MED ORDER — BOOST / RESOURCE BREEZE PO LIQD
1.0000 | Freq: Three times a day (TID) | ORAL | Status: DC
  Filled 2015-08-06 (×2): qty 1

## 2015-08-06 MED ORDER — SODIUM CHLORIDE 0.9 % IV BOLUS (SEPSIS)
2000.0000 mL | Freq: Once | INTRAVENOUS | Status: AC
  Administered 2015-08-06: 2000 mL via INTRAVENOUS

## 2015-08-06 MED ORDER — MEGESTROL ACETATE 400 MG/10ML PO SUSP: 400.0000 mg | Freq: Two times a day (BID) | ORAL | Status: AC

## 2015-08-06 NOTE — ED Notes (Signed)
Bed: WA14 Expected date:  Expected time:  Means of arrival:  Comments: EMS 

## 2015-08-06 NOTE — ED Provider Notes (Addendum)
CSN: [id number]     Arrival date & time 08/06/15  4098 History   First MD Initiated Contact with Patient 08/06/15 1001     Chief Complaint  Patient presents with  . Cough     (Consider location/radiation/quality/duration/timing/severity/associated sxs/prior Treatment) HPI Level V caveat altered mental status history is obtained fromShelby Wynetta Emery,, med tech at [facility] assisted living by telephone. Patient has been eating and drinking very little for the past 3 or 4 days. No known fever. He has not been coughing he's been spitting out fluid from his mouth. No vomiting. No other associated symptoms. Patient denies pain anywhere. No other associated symptoms. Ms. Brandenburg reports "he's not been acting himself". EMS treated patient with saline bolus 200 mL intravenously prior to coming here Past Medical History  Diagnosis Date  . Diabetes mellitus without complication ([place])   . Hypertension   . Hearing difficulty of both ears     Bilateral hearing aids  . Blindness     Severe visual impairment OD, Blind OS  . Abnormality of gait   . Peripheral edema   . Glaucoma   . Dyslipidemia   . 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 UTI (urinary tract infection)   . Generalized muscle weakness   . Renal disorder   . Dysphagia   . Cognitive communication deficit   . Encephalopathy   . Urinary retention   . Neoplasm of prostate   . Renal failure   . Hyperlipidemia    Past Surgical History  Procedure Laterality Date  . Eye surgery     Family History  Problem Relation Age of Onset  . Hypertension Mother   . Hypertension Father   . Diabetes Mellitus II Brother    Social History  Substance Use Topics  . Smoking status: Former Research scientist (life sciences)  . Smokeless tobacco: Never Used  . Alcohol Use: No    Review of Systems  Unable to perform ROS: Mental status change  Constitutional: Positive for appetite change.      Allergies  Review of patient's allergies indicates no known  allergies.  Home Medications   Prior to Admission medications   Medication Sig Start Date End Date Taking? Authorizing Provider  acetaminophen (TYLENOL) 650 MG CR tablet Take 650 mg by mouth every 8 (eight) hours.    Historical Provider, MD  atorvastatin (LIPITOR) 10 MG tablet Take 10 mg by mouth at bedtime.    Historical Provider, MD  brimonidine (ALPHAGAN) 0.2 % ophthalmic solution Place 1 drop into both eyes 3 (three) times daily.    Historical Provider, MD  carvedilol (COREG) 6.25 MG tablet Take 6.25 mg by mouth 2 (two) times daily. With food. 08/23/14   Historical Provider, MD  cephALEXin (KEFLEX) 500 MG capsule Take 1 capsule (500 mg total) by mouth 2 (two) times daily. Patient not taking: Reported on 07/15/2015 07/04/15   [name], MD  donepezil (ARICEPT) 10 MG tablet Take 10 mg by mouth at bedtime.    Historical Provider, MD  dorzolamide-timolol (COSOPT) 22.3-6.8 MG/ML ophthalmic solution Place 1 drop into the right eye 2 (two) times daily.    Historical Provider, MD  finasteride (PROSCAR) 5 MG tablet Take 5 mg by mouth daily. 10/11/14   Historical Provider, MD  LORazepam (ATIVAN) 0.5 MG tablet Take 0.5 mg by mouth at bedtime as needed (insomnia).    Historical Provider, MD  memantine (NAMENDA) 5 MG tablet Take 5 mg by mouth 2 (two) times daily.    Historical Provider, MD  mirtazapine (REMERON) 15 MG tablet Take 15 mg by mouth at bedtime.    Historical Provider, MD  nitrofurantoin, macrocrystal-monohydrate, (MACROBID) 100 MG capsule Take 1 capsule (100 mg total) by mouth 2 (two) times daily. 07/16/15   Charlann Lange, PA-C  NUTRITIONAL SUPPLEMENT LIQD Take 1 Can by mouth 3 (three) times daily. Between meals. "Med Pass."    Historical Provider, MD  senna-docusate (SENOKOT-S) 8.6-50 MG per tablet Take 1 tablet by mouth at bedtime as needed for mild constipation. 03/22/15   [name], MD  traMADol (ULTRAM) 50 MG tablet Take 50 mg by mouth 2 (two) times daily.    Historical Provider, MD   verapamil (CALAN-SR) 240 MG CR tablet Take 240 mg by mouth at bedtime.    Historical Provider, MD   BP 112/56 mmHg  Pulse 50  Temp(Src) 97.5 F (36.4 C) (Oral)  Resp 16  SpO2 99% Physical Exam  Constitutional:  Chronically ill-appearing  HENT:  Head: Normocephalic and atraumatic.  Mucous membranes dry right eye pupil 3 mm round  Eyes: Conjunctivae are normal. Pupils are equal, round, and reactive to light.  Left Cornea opacified  Neck: Neck supple. No tracheal deviation present. No thyromegaly present.  Cardiovascular: Normal rate and regular rhythm.   No murmur heard. Mildly bradycardic  Pulmonary/Chest: Effort normal and breath sounds normal.  Scant diffuse rhonchi  Abdominal: Soft. Bowel sounds are normal. He exhibits no distension. There is no tenderness.  Genitourinary: Rectum normal. Guaiac negative stool.  Musculoskeletal: Normal range of motion. He exhibits no edema or tenderness.  Neurological: He is alert. Coordination normal.  Once all extremities  Skin: Skin is warm and dry. No rash noted.  Psychiatric: He has a normal mood and affect.  Nursing note and vitals reviewed.   ED Course  Procedures (including critical care time) Labs Review Labs Reviewed - No data to display  Imaging Review No results found. I have personally reviewed and evaluated these images and lab results as part of my medical decision-making.   EKG Interpretation   Date/Time:  Tuesday August 06 2015 10:39:08 EST Ventricular Rate:  49 PR Interval:  199 QRS Duration: 93 QT Interval:  486 QTC Calculation: 439 R Axis:   3 Text Interpretation:  Sinus bradycardia Atrial premature complex No  significant change since last tracing Confirmed by Winfred Leeds  MD, Sante Biedermann  [phone]) on 08/06/2015 10:43:42 AM     Nurse reports the patient drank a "BOOST" beverage and 8 probably 40% of his lunch while here. Results for orders placed or performed during the hospital encounter of 08/06/15  CBC with  Differential/Platelet  Result Value Ref Range   WBC 13.6 (H) 4.0 - 10.5 K/uL   RBC 3.87 (L) 4.22 - 5.81 MIL/uL   Hemoglobin 11.1 (L) 13.0 - 17.0 g/dL   HCT 34.6 (L) 39.0 - 52.0 %   MCV 89.4 78.0 - 100.0 fL   MCH 28.7 26.0 - 34.0 pg   MCHC 32.1 30.0 - 36.0 g/dL   RDW 16.1 (H) 11.5 - 15.5 %   Platelets 366 150 - 400 K/uL   Neutrophils Relative % 82 %   Neutro Abs 11.2 (H) 1.7 - 7.7 K/uL   Lymphocytes Relative 9 %   Lymphs Abs 1.2 0.7 - 4.0 K/uL   Monocytes Relative 8 %   Monocytes Absolute 1.1 (H) 0.1 - 1.0 K/uL   Eosinophils Relative 1 %   Eosinophils Absolute 0.1 0.0 - 0.7 K/uL   Basophils Relative 0 %   Basophils  Absolute 0.0 0.0 - 0.1 K/uL  Comprehensive metabolic panel  Result Value Ref Range   Sodium 142 135 - 145 mmol/L   Potassium 4.1 3.5 - 5.1 mmol/L   Chloride 107 101 - 111 mmol/L   CO2 26 22 - 32 mmol/L   Glucose, Bld 135 (H) 65 - 99 mg/dL   BUN 24 (H) 6 - 20 mg/dL   Creatinine, Ser 1.40 (H) 0.61 - 1.24 mg/dL   Calcium 9.5 8.9 - 10.3 mg/dL   Total Protein 7.5 6.5 - 8.1 g/dL   Albumin 2.9 (L) 3.5 - 5.0 g/dL   AST 25 15 - 41 U/L   ALT 8 (L) 17 - 63 U/L   Alkaline Phosphatase 261 (H) 38 - 126 U/L   Total Bilirubin 0.8 0.3 - 1.2 mg/dL   GFR calc non Af Amer 44 (L) >60 mL/min   GFR calc Af Amer 51 (L) >60 mL/min   Anion gap 9 5 - 15  Ammonia  Result Value Ref Range   Ammonia 18 9 - 35 umol/L  POC occult blood, ED Provider will collect  Result Value Ref Range   Fecal Occult Bld NEGATIVE NEGATIVE  I-stat troponin, ED  Result Value Ref Range   Troponin i, poc 0.01 0.00 - 0.08 ng/mL   Comment 3           Dg Chest 2 View  08/06/2015  CLINICAL DATA:  Productive cough for 3 days with clear thick sputum. EXAM: CHEST  2 VIEW COMPARISON:  07/04/2015. FINDINGS: Telemetry leads overlie the chest. Normal cardiomediastinal silhouette. Bibasilar subsegmental atelectasis is suspected. No active infiltrates or failure can be observed. There is degenerative change both shoulders.  IMPRESSION: Bibasilar subsegmental atelectasis is suspected. No active infiltrates or failure. Worsening aeration from priors. Electronically Signed   By: Staci Righter M.D.   On: 08/06/2015 11:21   Ct Head Wo Contrast  08/06/2015  CLINICAL DATA:  Altered mental status today.  Hypertension, UTI EXAM: CT HEAD WITHOUT CONTRAST TECHNIQUE: Contiguous axial images were obtained from the base of the skull through the vertex without intravenous contrast. COMPARISON:  07/04/2015 FINDINGS: There is atrophy and chronic small vessel disease changes. No acute intracranial abnormality. Specifically, no hemorrhage, hydrocephalus, mass lesion, acute infarction, or significant intracranial injury. No acute calvarial abnormality. IMPRESSION: No acute intracranial abnormality. Atrophy, chronic microvascular disease. Electronically Signed   By: Rolm Baptise M.D.   On: 08/06/2015 11:11   nurses attempted to catheterize pt for urine but met resistance. Chest xray viewed by me MDM  I had a lengthy discussion with patient's power of attorney and brother who is requesting appetite stimulant. He reportedly has been on appetite stimulants in the past by his prior primary care physician. I attempted to call his primary care physician, Doctors making housecall. no answer. ua cancelled,  Final diagnoses:  None   Patient will go back to assisted living facility with prescription for Megace 400 mg twice a day 12 day supply, after consultation with hospital pharmacist.. Renal insufficiency is chronic. Anemia is chronic Dx  #1 mild dehydration #2 chronic renal insufficiency #3anemia    Orlie Dakin, MD 08/06/15 1546  Orlie Dakin, MD 08/06/15 1550

## 2015-08-06 NOTE — Progress Notes (Signed)
ED Cm consulted by EDP, Jacubowitz to review pt ED visit concerns [facility] at [facility]. Cm found by goggle this is an assisted living facility.  Pt brother has knowledge of processes that may assist pt with getting to a higher level of care (snf) From EDP assessment does not appear that brother is getting assist from [facility] staff to get pt to higher level of care.   This is a medicare pt at the ED/Outpatient level of care - no admission reason at this time EDP states attempting UA - Pending results pcp per EDP is [facility] but EDP not getting an answer when he attempts to call, wondering if the pcp is aware of pt need of higher level of care CM spoke with brother at pt bedside who states pt has been given a "thirty day notice' by the facility, "it has now been about 3 weeks since I got it" "I have been working with a Chief Executive Officer to spin down his assets before applying for medicaid"   Cm discussed with brother that pt has less than a week probably to vacate [facility] CM Reviewed options with brother to include (entered in d/c instructions and discussed with EDP Recommendations for ED Case manager 1) speak with [facility] staff to see if he can stay longer plus add private duty staff/sitters 2) continue to work on spin down/medicaid application 3) family discussion- home with home services 4) family discussion of private pay to nursing home  Brother states he prefers to speak with [facility] staff to see if he can stay longer plus add private duty staff/sitters  Reports brother has a son that may be able to allow pt to stay in his home (but he works) Advertising copywriter never agrees for pt to stay with him Brother noted to have redden eyes Question medical issues of his own Reports pt will not be able to return to his 2nd floor condo  CM reviewed in details medicare guidelines, home health ([place]) (length of stay in home, types of [facility] staff available, coverage, primary caregiver, up to 24 hrs before  services may be started), Private duty nursing (PDN-coverage, length of stay in the home types of staff available), assisted living (ASL- coverage, services offered) and Skilled nursing facilities (snf- coverage and services offered). CM reviewed availability of [place] to assist pcp to get pt to snf (if desired disposition) from the community level. CM provided pt/brother with a list of [facility], PDN, ALF and SNFs.

## 2015-08-06 NOTE — ED Notes (Signed)
Pt ate approximately 40% of regular meal tray

## 2015-08-06 NOTE — ED Notes (Signed)
Freda Munro, RN reports being unable to place in and out catheter for urine sample due to meeting resistance.  Provider is aware.  Urinal in place for clean catch sample.

## 2015-08-06 NOTE — ED Notes (Signed)
Dr. Cathleen Fears made aware of swallow eval with Pt.  Pt was able to swallow thin water without cough or pooling.  Dr. Cathleen Fears and the Pts POA want the Pt to attempt eating.  He eats a regular diet at facility with [employer].

## 2015-08-06 NOTE — ED Notes (Signed)
[facility] notified of need for transport. 

## 2015-08-06 NOTE — Discharge Instructions (Signed)
Dehydration, Adult An appetite stimulant has been prescribed. Called Kent Mayo's primary care physician tomorrow to arrange follow-up and any medication refills. Have him return if his condition worsens for any reason Dehydration is a condition in which you do not have enough fluid or water in your body. It happens when you take in less fluid than you lose. Vital organs such as the kidneys, brain, and heart cannot function without a proper amount of fluids. Any loss of fluids from the body can cause dehydration.  Dehydration can range from mild to severe. This condition should be treated right away to help prevent it from becoming severe. CAUSES  This condition may be caused by:  Vomiting.  Diarrhea.  Excessive sweating, such as when exercising in hot or humid weather.  Not drinking enough fluid during strenuous exercise or during an illness.  Excessive urine output.  Fever.  Certain medicines. RISK FACTORS This condition is more likely to develop in:  People who are taking certain medicines that cause the body to lose excess fluid (diuretics).   People who have a chronic illness, such as diabetes, that may increase urination.  Older adults.   People who live at high altitudes.   People who participate in endurance sports.  SYMPTOMS  Mild Dehydration  Thirst.  Dry lips.  Slightly dry mouth.  Dry, warm skin. Moderate Dehydration  Very dry mouth.   Muscle cramps.   Dark urine and decreased urine production.   Decreased tear production.   Headache.   Light-headedness, especially when you stand up from a sitting position.  Severe Dehydration  Changes in skin.   Cold and clammy skin.   Skin does not spring back quickly when lightly pinched and released.   Changes in body fluids.   Extreme thirst.   No tears.   Not able to sweat when body temperature is high, such as in hot weather.   Minimal urine production.   Changes in vital  signs.   Rapid, weak pulse (more than 100 beats per minute when you are sitting still).   Rapid breathing.   Low blood pressure.   Other changes.   Sunken eyes.   Cold hands and feet.   Confusion.  Lethargy and difficulty being awakened.  Fainting (syncope).   Short-term weight loss.   Unconsciousness. DIAGNOSIS  This condition may be diagnosed based on your symptoms. You may also have tests to determine how severe your dehydration is. These tests may include:   Urine tests.   Blood tests.  TREATMENT  Treatment for this condition depends on the severity. Mild or moderate dehydration can often be treated at home. Treatment should be started right away. Do not wait until dehydration becomes severe. Severe dehydration needs to be treated at the hospital. Treatment for Mild Dehydration  Drinking plenty of water to replace the fluid you have lost.   Replacing minerals in your blood (electrolytes) that you may have lost.  Treatment for Moderate Dehydration  Consuming oral rehydration solution (ORS). Treatment for Severe Dehydration  Receiving fluid through an IV tube.   Receiving electrolyte solution through a feeding tube that is passed through your nose and into your stomach (nasogastric tube or NG tube).  Correcting any abnormalities in electrolytes. HOME CARE INSTRUCTIONS   Drink enough fluid to keep your urine clear or pale yellow.   Drink water or fluid slowly by taking small sips. You can also try sucking on ice cubes.  Have food or beverages that contain electrolytes. Examples include  bananas and sports drinks.  Take over-the-counter and prescription medicines only as told by your health care provider.   Prepare ORS according to the manufacturer's instructions. Take sips of ORS every 5 minutes until your urine returns to normal.  If you have vomiting or diarrhea, continue to try to drink water, ORS, or both.   If you have diarrhea,  avoid:   Beverages that contain caffeine.   Fruit juice.   Milk.   Carbonated soft drinks.  Do not take salt tablets. This can lead to the condition of having too much sodium in your body (hypernatremia).  SEEK MEDICAL CARE IF:  You cannot eat or drink without vomiting.  You have had moderate diarrhea during a period of more than 24 hours.  You have a fever. SEEK IMMEDIATE MEDICAL CARE IF:   You have extreme thirst.  You have severe diarrhea.  You have not urinated in 6-8 hours, or you have urinated only a small amount of very dark urine.  You have shriveled skin.  You are dizzy, confused, or both.   This information is not intended to replace advice given to you by your health care provider. Make sure you discuss any questions you have with your health care provider.   Document Released: 07/20/2005 Document Revised: 04/10/2015 Document Reviewed: 12/05/2014 Elsevier Interactive Patient Education [employer].

## 2015-08-06 NOTE — ED Notes (Signed)
Pt is eating regular meal tray (being fed) and is having no difficulty with swallowing.

## 2015-08-06 NOTE — ED Notes (Signed)
Per EMS- Patient is a resident of [facility]. Patient has had a productive cough with clear thick sputum x 3 days. Rhonchi right side. Patient had 200 ml of NS prior to arrival tot he ED. Patient is blind and HOH.

## 2015-08-06 NOTE — ED Notes (Signed)
Bed: [facility] Expected date:  Expected time:  Means of arrival:  Comments: 

## 2015-08-11 ENCOUNTER — Emergency Department (HOSPITAL_COMMUNITY): Payer: Medicare Other

## 2015-08-11 ENCOUNTER — Inpatient Hospital Stay (HOSPITAL_COMMUNITY)
Admission: EM | Admit: 2015-08-11 | Discharge: 2015-08-16 | DRG: 640 | Disposition: A | Discharge status: Skilled Nursing Facility | Payer: Medicare Other | Attending: Internal Medicine | Admitting: Internal Medicine

## 2015-08-11 ENCOUNTER — Encounter (HOSPITAL_COMMUNITY): Payer: Self-pay

## 2015-08-11 DIAGNOSIS — F0390 Unspecified dementia without behavioral disturbance: Secondary | ICD-10-CM | POA: Diagnosis present

## 2015-08-11 DIAGNOSIS — Y92129 Unspecified place in nursing home as the place of occurrence of the external cause: Secondary | ICD-10-CM

## 2015-08-11 DIAGNOSIS — M545 Low back pain, unspecified: Secondary | ICD-10-CM

## 2015-08-11 DIAGNOSIS — R55 Syncope and collapse: Secondary | ICD-10-CM

## 2015-08-11 DIAGNOSIS — C7911 Secondary malignant neoplasm of bladder: Secondary | ICD-10-CM | POA: Diagnosis present

## 2015-08-11 DIAGNOSIS — W19XXXA Unspecified fall, initial encounter: Secondary | ICD-10-CM

## 2015-08-11 DIAGNOSIS — R630 Anorexia: Secondary | ICD-10-CM | POA: Diagnosis present

## 2015-08-11 DIAGNOSIS — H409 Unspecified glaucoma: Secondary | ICD-10-CM | POA: Diagnosis present

## 2015-08-11 DIAGNOSIS — I129 Hypertensive chronic kidney disease with stage 1 through stage 4 chronic kidney disease, or unspecified chronic kidney disease: Secondary | ICD-10-CM | POA: Diagnosis present

## 2015-08-11 DIAGNOSIS — Z87891 Personal history of nicotine dependence: Secondary | ICD-10-CM

## 2015-08-11 DIAGNOSIS — J90 Pleural effusion, not elsewhere classified: Secondary | ICD-10-CM

## 2015-08-11 DIAGNOSIS — Z8673 Personal history of transient ischemic attack (TIA), and cerebral infarction without residual deficits: Secondary | ICD-10-CM

## 2015-08-11 DIAGNOSIS — E46 Unspecified protein-calorie malnutrition: Secondary | ICD-10-CM | POA: Diagnosis present

## 2015-08-11 DIAGNOSIS — Z993 Dependence on wheelchair: Secondary | ICD-10-CM

## 2015-08-11 DIAGNOSIS — D649 Anemia, unspecified: Secondary | ICD-10-CM | POA: Diagnosis present

## 2015-08-11 DIAGNOSIS — R4182 Altered mental status, unspecified: Secondary | ICD-10-CM

## 2015-08-11 DIAGNOSIS — Z6823 Body mass index (BMI) 23.0-23.9, adult: Secondary | ICD-10-CM

## 2015-08-11 DIAGNOSIS — N1330 Unspecified hydronephrosis: Secondary | ICD-10-CM

## 2015-08-11 DIAGNOSIS — R627 Adult failure to thrive: Secondary | ICD-10-CM | POA: Diagnosis present

## 2015-08-11 DIAGNOSIS — E860 Dehydration: Principal | ICD-10-CM | POA: Diagnosis present

## 2015-08-11 DIAGNOSIS — E1122 Type 2 diabetes mellitus with diabetic chronic kidney disease: Secondary | ICD-10-CM | POA: Diagnosis present

## 2015-08-11 DIAGNOSIS — C7970 Secondary malignant neoplasm of unspecified adrenal gland: Secondary | ICD-10-CM | POA: Diagnosis present

## 2015-08-11 DIAGNOSIS — C61 Malignant neoplasm of prostate: Secondary | ICD-10-CM | POA: Diagnosis present

## 2015-08-11 DIAGNOSIS — Z515 Encounter for palliative care: Secondary | ICD-10-CM

## 2015-08-11 DIAGNOSIS — E785 Hyperlipidemia, unspecified: Secondary | ICD-10-CM | POA: Diagnosis present

## 2015-08-11 DIAGNOSIS — C779 Secondary and unspecified malignant neoplasm of lymph node, unspecified: Secondary | ICD-10-CM | POA: Diagnosis present

## 2015-08-11 DIAGNOSIS — R41841 Cognitive communication deficit: Secondary | ICD-10-CM | POA: Diagnosis present

## 2015-08-11 DIAGNOSIS — R609 Edema, unspecified: Secondary | ICD-10-CM

## 2015-08-11 DIAGNOSIS — C801 Malignant (primary) neoplasm, unspecified: Secondary | ICD-10-CM

## 2015-08-11 DIAGNOSIS — C7800 Secondary malignant neoplasm of unspecified lung: Secondary | ICD-10-CM | POA: Diagnosis present

## 2015-08-11 DIAGNOSIS — N390 Urinary tract infection, site not specified: Secondary | ICD-10-CM | POA: Diagnosis present

## 2015-08-11 DIAGNOSIS — N320 Bladder-neck obstruction: Secondary | ICD-10-CM | POA: Diagnosis present

## 2015-08-11 DIAGNOSIS — Z87440 Personal history of urinary (tract) infections: Secondary | ICD-10-CM

## 2015-08-11 DIAGNOSIS — N179 Acute kidney failure, unspecified: Secondary | ICD-10-CM | POA: Diagnosis present

## 2015-08-11 DIAGNOSIS — N189 Chronic kidney disease, unspecified: Secondary | ICD-10-CM

## 2015-08-11 DIAGNOSIS — N183 Chronic kidney disease, stage 3 (moderate): Secondary | ICD-10-CM | POA: Diagnosis present

## 2015-08-11 DIAGNOSIS — Z87821 Personal history of retained foreign body fully removed: Secondary | ICD-10-CM

## 2015-08-11 DIAGNOSIS — C7951 Secondary malignant neoplasm of bone: Secondary | ICD-10-CM | POA: Diagnosis present

## 2015-08-11 DIAGNOSIS — I10 Essential (primary) hypertension: Secondary | ICD-10-CM | POA: Diagnosis present

## 2015-08-11 DIAGNOSIS — E43 Unspecified severe protein-calorie malnutrition: Secondary | ICD-10-CM | POA: Diagnosis present

## 2015-08-11 DIAGNOSIS — N400 Enlarged prostate without lower urinary tract symptoms: Secondary | ICD-10-CM | POA: Diagnosis present

## 2015-08-11 LAB — URINALYSIS, ROUTINE W REFLEX MICROSCOPIC
GLUCOSE, UA: NEGATIVE mg/dL
Ketones, ur: 15 mg/dL — AB
Nitrite: NEGATIVE
PH: 5.5 (ref 5.0–8.0)
PROTEIN: 30 mg/dL — AB
SPECIFIC GRAVITY, URINE: 1.023 (ref 1.005–1.030)

## 2015-08-11 LAB — GLUCOSE, CAPILLARY: GLUCOSE-CAPILLARY: 70 mg/dL (ref 65–99)

## 2015-08-11 LAB — URINE MICROSCOPIC-ADD ON

## 2015-08-11 LAB — BASIC METABOLIC PANEL
Anion gap: 12 (ref 5–15)
BUN: 27 mg/dL — AB (ref 6–20)
CALCIUM: 9.7 mg/dL (ref 8.9–10.3)
CHLORIDE: 107 mmol/L (ref 101–111)
CO2: 23 mmol/L (ref 22–32)
CREATININE: 1.84 mg/dL — AB (ref 0.61–1.24)
GFR calc non Af Amer: 32 mL/min — ABNORMAL LOW (ref >60)
GFR, EST AFRICAN AMERICAN: 37 mL/min — AB (ref >60)
GLUCOSE: 89 mg/dL (ref 65–99)
Potassium: 4.9 mmol/L (ref 3.5–5.1)
Sodium: 142 mmol/L (ref 135–145)

## 2015-08-11 LAB — CBC WITH DIFFERENTIAL/PLATELET
BASOS PCT: 0 %
Basophils Absolute: 0 10*3/uL (ref 0.0–0.1)
Eosinophils Absolute: 0.1 10*3/uL (ref 0.0–0.7)
Eosinophils Relative: 1 %
HEMATOCRIT: 35.9 % — AB (ref 39.0–52.0)
HEMOGLOBIN: 12 g/dL — AB (ref 13.0–17.0)
LYMPHS ABS: 2 10*3/uL (ref 0.7–4.0)
Lymphocytes Relative: 13 %
MCH: 28.8 pg (ref 26.0–34.0)
MCHC: 33.4 g/dL (ref 30.0–36.0)
MCV: 86.3 fL (ref 78.0–100.0)
MONO ABS: 1.5 10*3/uL — AB (ref 0.1–1.0)
MONOS PCT: 10 %
NEUTROS ABS: 11.8 10*3/uL — AB (ref 1.7–7.7)
Neutrophils Relative %: 76 %
Platelets: 398 10*3/uL (ref 150–400)
RBC: 4.16 MIL/uL — ABNORMAL LOW (ref 4.22–5.81)
RDW: 16.2 % — AB (ref 11.5–15.5)
WBC: 15.4 10*3/uL — ABNORMAL HIGH (ref 4.0–10.5)

## 2015-08-11 LAB — TROPONIN I: Troponin I: 0.03 ng/mL (ref <0.031)

## 2015-08-11 LAB — I-STAT TROPONIN, ED
Troponin i, poc: 0 ng/mL (ref 0.00–0.08)
Troponin i, poc: 0 ng/mL (ref 0.00–0.08)

## 2015-08-11 LAB — CK: Total CK: 82 U/L (ref 49–397)

## 2015-08-11 MED ORDER — LORAZEPAM 0.5 MG PO TABS
0.5000 mg | ORAL_TABLET | Freq: Every evening | ORAL | Status: DC | PRN
Start: 2015-08-11 — End: 2015-08-16

## 2015-08-11 MED ORDER — ATORVASTATIN CALCIUM 10 MG PO TABS
10.0000 mg | ORAL_TABLET | Freq: Every day | ORAL | Status: DC
  Administered 2015-08-12 – 2015-08-15 (×4): 10 mg via ORAL
  Filled 2015-08-11 (×5): qty 1

## 2015-08-11 MED ORDER — DORZOLAMIDE HCL-TIMOLOL MAL 2-0.5 % OP SOLN
1.0000 [drp] | Freq: Two times a day (BID) | OPHTHALMIC | Status: DC
  Administered 2015-08-12 – 2015-08-16 (×9): 1 [drp] via OPHTHALMIC
  Filled 2015-08-11: qty 10

## 2015-08-11 MED ORDER — SENNOSIDES-DOCUSATE SODIUM 8.6-50 MG PO TABS: 1.0000 | ORAL_TABLET | Freq: Every evening | ORAL | Status: DC | PRN

## 2015-08-11 MED ORDER — MEGESTROL ACETATE 400 MG/10ML PO SUSP
400.0000 mg | Freq: Two times a day (BID) | ORAL | Status: DC
  Administered 2015-08-13 – 2015-08-16 (×7): 400 mg via ORAL
  Filled 2015-08-11 (×11): qty 10

## 2015-08-11 MED ORDER — ENOXAPARIN SODIUM 40 MG/0.4ML ~~LOC~~ SOLN
40.0000 mg | Freq: Every day | SUBCUTANEOUS | Status: DC
  Administered 2015-08-12 – 2015-08-16 (×5): 40 mg via SUBCUTANEOUS
  Filled 2015-08-11 (×5): qty 0.4

## 2015-08-11 MED ORDER — GUAIFENESIN ER 600 MG PO TB12: 600.0000 mg | ORAL_TABLET | Freq: Two times a day (BID) | ORAL | Status: DC | PRN

## 2015-08-11 MED ORDER — TRAMADOL HCL 50 MG PO TABS
50.0000 mg | ORAL_TABLET | Freq: Two times a day (BID) | ORAL | Status: DC
  Administered 2015-08-12 – 2015-08-15 (×6): 50 mg via ORAL
  Filled 2015-08-11 (×7): qty 1

## 2015-08-11 MED ORDER — ENSURE ENLIVE PO LIQD
237.0000 mL | Freq: Three times a day (TID) | ORAL | Status: DC
  Administered 2015-08-12 – 2015-08-16 (×13): 237 mL via ORAL

## 2015-08-11 MED ORDER — MIRTAZAPINE 7.5 MG PO TABS
15.0000 mg | ORAL_TABLET | Freq: Every day | ORAL | Status: DC
  Administered 2015-08-12 – 2015-08-15 (×4): 15 mg via ORAL
  Filled 2015-08-11 (×5): qty 2

## 2015-08-11 MED ORDER — MEMANTINE HCL 5 MG PO TABS
10.0000 mg | ORAL_TABLET | Freq: Two times a day (BID) | ORAL | Status: DC
  Administered 2015-08-12 – 2015-08-16 (×8): 10 mg via ORAL
  Filled 2015-08-11 (×9): qty 2

## 2015-08-11 MED ORDER — DONEPEZIL HCL 10 MG PO TABS
10.0000 mg | ORAL_TABLET | Freq: Two times a day (BID) | ORAL | Status: DC
  Administered 2015-08-13 – 2015-08-16 (×7): 10 mg via ORAL
  Filled 2015-08-11 (×8): qty 1

## 2015-08-11 MED ORDER — VERAPAMIL HCL ER 240 MG PO TBCR
240.0000 mg | EXTENDED_RELEASE_TABLET | Freq: Every day | ORAL | Status: DC
  Administered 2015-08-12 – 2015-08-16 (×5): 240 mg via ORAL
  Filled 2015-08-11 (×5): qty 1

## 2015-08-11 MED ORDER — CARVEDILOL 6.25 MG PO TABS
6.2500 mg | ORAL_TABLET | Freq: Two times a day (BID) | ORAL | Status: DC
  Administered 2015-08-12 – 2015-08-16 (×8): 6.25 mg via ORAL
  Filled 2015-08-11 (×8): qty 1

## 2015-08-11 MED ORDER — FINASTERIDE 5 MG PO TABS
5.0000 mg | ORAL_TABLET | Freq: Every day | ORAL | Status: DC
  Administered 2015-08-12 – 2015-08-13 (×2): 5 mg via ORAL
  Filled 2015-08-11 (×2): qty 1

## 2015-08-11 MED ORDER — BRIMONIDINE TARTRATE 0.2 % OP SOLN
1.0000 [drp] | Freq: Three times a day (TID) | OPHTHALMIC | Status: DC
  Administered 2015-08-12 – 2015-08-16 (×13): 1 [drp] via OPHTHALMIC
  Filled 2015-08-11: qty 5

## 2015-08-11 NOTE — H&P (Signed)
[facility] History and Physical  [name] [id number] DOB: Dec 10, 1926 DOA: 08/11/2015  Referring physician: Dr.Seymore. PCP: No primary care provider on file.  Specialists: None.  Chief Complaint: Fall.  History obtained from patient's brother and your physician in previous records. Patient has dementia.  HPI: Kent Mayo is a 82 y.o. male with history of dementia, hypertension, chronic anemia, chronic kidney disease and legally blind was brought to the ER patient had a fall at his nursing home. As per patient's brother patient has not been eating well for last 1 month. Patient is wheelchair bound and this evening patient was found to be on the floor. As per the patient's brother patient probably did not lose consciousness. Since patient has dementia and patient was not able to contribute to history. Patient on exam was complaining of pain around the lower extremities. X-ray of the pelvis did not show any fracture. There is some bruising around the knee and x-rays are pending. CT head and neck did not show anything acute. CT abdomen shows possible prostate cancer with left-sided hydronephrosis. At this time cause for patient's fall is not known if it is syncope or near syncope.   Review of Systems: As presented in the history of presenting illness, rest negative.  Past Medical History  Diagnosis Date  . Diabetes mellitus without complication ([place])   . Hypertension   . Hearing difficulty of both ears     Bilateral hearing aids  . Blindness     Severe visual impairment OD, Blind OS  . Abnormality of gait   . Peripheral edema   . Glaucoma   . Dyslipidemia   . 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 UTI (urinary tract infection)   . Generalized muscle weakness   . Renal disorder   . Dysphagia   . Cognitive communication deficit   . Encephalopathy   . Urinary retention   . Neoplasm of prostate   . Renal failure   . Hyperlipidemia    Past Surgical History   Procedure Laterality Date  . Eye surgery     Social History:  reports that he has quit smoking. He has never used smokeless tobacco. He reports that he does not drink alcohol or use illicit drugs. Where does patient live nursing home. Can patient participate in ADLs? No.  No Known Allergies  Family History:  Family History  Problem Relation Age of Onset  . Hypertension Mother   . Hypertension Father   . Diabetes Mellitus II Brother       Prior to Admission medications   Medication Sig Start Date End Date Taking? Authorizing Provider  acetaminophen (TYLENOL) 650 MG CR tablet Take 650 mg by mouth every 8 (eight) hours.   Yes Historical Provider, MD  atorvastatin (LIPITOR) 10 MG tablet Take 10 mg by mouth at bedtime.   Yes Historical Provider, MD  brimonidine (ALPHAGAN) 0.2 % ophthalmic solution Place 1 drop into both eyes 3 (three) times daily.   Yes Historical Provider, MD  carvedilol (COREG) 6.25 MG tablet Take 6.25 mg by mouth 2 (two) times daily. With food. 08/23/14  Yes Historical Provider, MD  donepezil (ARICEPT) 10 MG tablet Take 10 mg by mouth 2 (two) times daily.    Yes Historical Provider, MD  dorzolamide-timolol (COSOPT) 22.3-6.8 MG/ML ophthalmic solution Place 1 drop into the right eye 2 (two) times daily.   Yes Historical Provider, MD  finasteride (PROSCAR) 5 MG tablet Take 5 mg by mouth daily. 10/11/14  Yes Historical  Provider, MD  guaiFENesin (MUCINEX) 600 MG 12 hr tablet Take 600 mg by mouth 2 (two) times daily as needed for to loosen phlegm.   Yes Historical Provider, MD  LORazepam (ATIVAN) 0.5 MG tablet Take 0.5 mg by mouth at bedtime as needed (insomnia).   Yes Historical Provider, MD  megestrol (MEGACE) 400 MG/10ML suspension Take 10 mLs (400 mg total) by mouth 2 (two) times daily. 08/06/15  Yes Orlie Dakin, MD  memantine (NAMENDA) 10 MG tablet Take 10 mg by mouth 2 (two) times daily.   Yes Historical Provider, MD  mirtazapine (REMERON) 15 MG tablet Take 15 mg by  mouth at bedtime.   Yes Historical Provider, MD  NUTRITIONAL SUPPLEMENT LIQD Take 1 Can by mouth 3 (three) times daily. Between meals. "Med Pass."   Yes Historical Provider, MD  senna-docusate (SENOKOT-S) 8.6-50 MG per tablet Take 1 tablet by mouth at bedtime as needed for mild constipation. 03/22/15  Yes [name], MD  traMADol (ULTRAM) 50 MG tablet Take 50 mg by mouth 2 (two) times daily.   Yes Historical Provider, MD  verapamil (CALAN-SR) 240 MG CR tablet Take 240 mg by mouth daily.    Yes Historical Provider, MD    Physical Exam: Filed Vitals:   08/11/15 2015 08/11/15 2030 08/11/15 2100 08/11/15 2214  BP: 162/96 153/77 154/95 153/87  Pulse: 91 94 90 90  Temp:    97.7 F (36.5 C)  TempSrc:      Resp: 17 16 18 22   Height:    6\' 2"  (1.88 m)  Weight:    82.464 kg (181 lb 12.8 oz)  SpO2: 96% 99% 99% 100%     General:  Moderately built and nourished.  Eyes: Anicteric no pallor. Left cornea opaque.  ENT: No discharge from the ears eyes nose and mouth.  Neck: No mass felt. No JVD appreciated.  Cardiovascular: S1 and S2 heard.  Respiratory: No rhonchi or crepitations.  Abdomen: Soft nontender bowel sounds present.  Skin: Multiple skin bruises.  Musculoskeletal: Bilateral lower extremity edema.  Psychiatric: Patient is demented and looks irritated.  Neurologic: Patient is alert awake and looks irritable and does not follow commands.  Labs on Admission:  Basic Metabolic Panel:  Recent Labs Lab 08/06/15 1049 08/11/15 1716  NA 142 142  K 4.1 4.9  CL 107 107  CO2 26 23  GLUCOSE 135* 89  BUN 24* 27*  CREATININE 1.40* 1.84*  CALCIUM 9.5 9.7   Liver Function Tests:  Recent Labs Lab 08/06/15 1049  AST 25  ALT 8*  ALKPHOS 261*  BILITOT 0.8  PROT 7.5  ALBUMIN 2.9*   No results for input(s): LIPASE, AMYLASE in the last 168 hours.  Recent Labs Lab 08/06/15 1049  AMMONIA 18   CBC:  Recent Labs Lab 08/06/15 1049 08/11/15 1716  WBC 13.6* 15.4*   NEUTROABS 11.2* 11.8*  HGB 11.1* 12.0*  HCT 34.6* 35.9*  MCV 89.4 86.3  PLT 366 398   Cardiac Enzymes:  Recent Labs Lab 08/11/15 1716  CKTOTAL 82    BNP (last 3 results) No results for input(s): BNP in the last 8760 hours.  ProBNP (last 3 results) No results for input(s): PROBNP in the last 8760 hours.  CBG: No results for input(s): GLUCAP in the last 168 hours.  Radiological Exams on Admission: Ct Abdomen Pelvis Wo Contrast  08/11/2015  CLINICAL DATA:  Unwitnessed fall EXAM: CT ABDOMEN AND PELVIS WITHOUT CONTRAST TECHNIQUE: Multidetector CT imaging of the abdomen and pelvis was performed  following the standard protocol without IV contrast. COMPARISON:  08/11/2015 FINDINGS: Lower chest: There is an at least moderate partially visualized left pleural effusion. There is a small right pleural effusion. Bilaterally there is underlying atelectasis. Hepatobiliary: Mild sludge layering dependently in the gallbladder. Gallbladder is mildly distended with no evidence of inflammation surrounding it. Liver is normal. Pancreas: Normal Spleen: Normal Adrenals/Urinary Tract: There is moderate to severe dilatation of the left ureter and moderate left hydronephrosis. Several tiny nonobstructing left renal calculi are noted. There is evidence of a large lobulated soft tissue mass involving the mid to inferior left aspect of the bladder measuring about 7 by 7 x 5.5 cm. Stomach/Bowel: Nonobstructive bowel gas pattern. Diverticulosis of the sigmoid colon. Mild to moderate fecal impaction in the rectum which is distended to a diameter of 7.5 cm. Vascular/Lymphatic: Atherosclerotic aortoiliac calcification. Soft tissue fullness in the retroperitoneum in both the. Aortic and aortocaval region. This suggests adenopathy as described on report of CT scan performed 08/11/2015. There is bulky left pelvic sidewall adenopathy. Reproductive: The mass involving the left inferior bladder could arise from either the  prostate or the bladder. Musculoskeletal: Numerous sclerotic lesions throughout the thoracolumbar spine and sacrum. Sclerotic lesions are also seen in the iliac bones and sacrum. IMPRESSION: Evidence of carcinoma arising either from the prostate or the bladder, but involving both organs and causing left hydronephrosis. Evidence of metastatic adenopathy in the retroperitoneum and in the pelvis. Bilateral pleural effusions. Electronically Signed   By: Skipper Cliche M.D.   On: 08/11/2015 20:19   Dg Tibia/fibula Left  08/11/2015  CLINICAL DATA:  Recent fall, initial encounter EXAM: LEFT TIBIA AND FIBULA - 2 VIEW COMPARISON:  None. FINDINGS: No acute fracture or dislocation is noted. There is some bony bridging between the distal tibia and distal fibula. No joint effusion is seen no soft tissue abnormalities are noted. IMPRESSION: No acute abnormality seen. Electronically Signed   By: Inez Catalina M.D.   On: 08/11/2015 17:34   Ct Head Wo Contrast  08/11/2015  CLINICAL DATA:  Unwitnessed fall with headaches and neck pain, initial encounter EXAM: CT HEAD WITHOUT CONTRAST CT CERVICAL SPINE WITHOUT CONTRAST TECHNIQUE: Multidetector CT imaging of the head and cervical spine was performed following the standard protocol without intravenous contrast. Multiplanar CT image reconstructions of the cervical spine were also generated. COMPARISON:  08/06/2015 FINDINGS: CT HEAD FINDINGS Bony calvarium is intact. Postsurgical changes are noted in the right orbit. Mild atrophic changes are noted. Mild chronic white matter ischemic change is seen. No findings to suggest acute hemorrhage, acute infarction or space-occupying mass lesion are noted. CT CERVICAL SPINE FINDINGS Seven cervical segments are well visualized. Vertebral body height is well maintained. Osteophytic changes noted from C3-C7. Facet hypertrophic changes are noted. No acute fracture or acute facet abnormality is noted. Surrounding soft tissue structures show  evidence of a left-sided pleural effusion of relatively large size. IMPRESSION: CT of the head:  No acute intracranial abnormality noted. CT of the cervical spine: Degenerative changes without acute bony abnormality. Large left pleural effusion. Electronically Signed   By: Inez Catalina M.D.   On: 08/11/2015 16:53   Ct Cervical Spine Wo Contrast  08/11/2015  CLINICAL DATA:  Unwitnessed fall with headaches and neck pain, initial encounter EXAM: CT HEAD WITHOUT CONTRAST CT CERVICAL SPINE WITHOUT CONTRAST TECHNIQUE: Multidetector CT imaging of the head and cervical spine was performed following the standard protocol without intravenous contrast. Multiplanar CT image reconstructions of the cervical spine were also generated.  COMPARISON:  08/06/2015 FINDINGS: CT HEAD FINDINGS Bony calvarium is intact. Postsurgical changes are noted in the right orbit. Mild atrophic changes are noted. Mild chronic white matter ischemic change is seen. No findings to suggest acute hemorrhage, acute infarction or space-occupying mass lesion are noted. CT CERVICAL SPINE FINDINGS Seven cervical segments are well visualized. Vertebral body height is well maintained. Osteophytic changes noted from C3-C7. Facet hypertrophic changes are noted. No acute fracture or acute facet abnormality is noted. Surrounding soft tissue structures show evidence of a left-sided pleural effusion of relatively large size. IMPRESSION: CT of the head:  No acute intracranial abnormality noted. CT of the cervical spine: Degenerative changes without acute bony abnormality. Large left pleural effusion. Electronically Signed   By: Inez Catalina M.D.   On: 08/11/2015 16:53   Ct Lumbar Spine Wo Contrast  08/11/2015  CLINICAL DATA:  Status post unwitnessed fall today. Lower extremity and low back pain. Initial encounter. EXAM: CT LUMBAR SPINE WITHOUT CONTRAST TECHNIQUE: Multidetector CT imaging of the lumbar spine was performed without intravenous contrast administration.  Multiplanar CT image reconstructions were also generated. COMPARISON:  None. FINDINGS: Vertebral body height and alignment are maintained. A sclerotic lesion in the L3 vertebral body to the right measures 2.8 cm AP by 2.1 cm transverse. Several punctate sclerotic lesions are also identified in the left ilium. Finally, a round sclerotic lesion is seen in the left side of the T11 vertebral body anterior to the pedicle measuring 1.3 cm AP by 1.1 cm transverse. Paraspinous structures demonstrate a small right and small to moderate left pleural effusion. Neither effusion is completely visualized. Bronchiectatic changes seen the right lower lobe. The patient has extensive retroperitoneal lymphadenopathy. Index paraaortic nodal mass measures approximately 3.4 x 2.9 cm on image 90 tripped. Inter-aortocaval node on image 90 measures approximately 2.1 cm short axis dimension. The patient has mild to moderate left hydronephrosis. Cause for obstruction is not discretely identified but the left ureter is not seen in its entirety. There appears to be bladder wall thickening eccentric to the left. Two small nonobstructing stones are identified the left kidney. There is scattered facet degenerative disease lumbar spine. Minimal disc bulge and endplate spurring are noted at L3-4, L4-5 and L5-S1. IMPRESSION: Negative for fracture. Findings consistent with metastatic neoplastic process with extensive abdominal and pelvic lymphadenopathy and sclerotic bone lesions identified. There is left hydronephrosis with partial visualization of a lesion in the left side of the urinary bladder. Most likely differential considerations are prostate or bladder carcinoma. These results were called by telephone at the time of interpretation on 08/11/2015 at 5:00 pm to Dr. Esaw Grandchild , who verbally acknowledged these results. Electronically Signed   By: Inge Rise M.D.   On: 08/11/2015 17:01   Dg Chest Port 1 View  08/11/2015  CLINICAL DATA:   Recent fall EXAM: PORTABLE CHEST - 1 VIEW COMPARISON:  08/06/2015 FINDINGS: Cardiac shadow is within normal limits. The lungs are well aerated bilaterally. No focal infiltrate or sizable effusion is noted. IMPRESSION: No acute abnormality noted. Electronically Signed   By: Inez Catalina M.D.   On: 08/11/2015 17:36   Dg Hips Bilat With Pelvis 2v  08/11/2015  CLINICAL DATA:  Status post fall. EXAM: DG HIP (WITH OR WITHOUT PELVIS) 2V BILAT COMPARISON:  None. FINDINGS: There is decreased bone mineralization. There is no evidence of hip fracture or dislocation. Mild to moderate bilateral hip joint osteoarthritic changes are seen, with joint space narrowing, subchondral sclerosis of the acetabula and osteophyte  formation. IMPRESSION: No evidence of fracture or subluxation of bilateral hips. Electronically Signed   By: Fidela Salisbury M.D.   On: 08/11/2015 17:39    EKG: Independently reviewed. Normal sinus rhythm with APCs.  Assessment/Plan Principal Problem:   Fall at nursing home Active Problems:   HTN (hypertension)   FTT (failure to thrive) in adult   Renal failure (ARF), acute on chronic ([place])   Fall   1. Fall not sure if syncope or near syncope - will observe in telemetry overnight. Patient's brother at this time as requested full workup. Cycle cardiac markers. Since patient has possible prostate cancer will check MRI brain. 2. Failure to thrive - Since patient has not been eating well over the last 1 month which could've also contributed to patient's fall. Will consult hospice for goals of care. 3. Acute on chronic renal failure with left-sided hydronephrosis and possible prostate cancer  - urine analysis is still pending. Patient eventually will need urology referral. Closely follow intake output metabolic panel. 4. Left pleural effusion with lower extremity edema - patient's 2-D echo done in 2040 showing EF of 60%. At this time I have ordered CT chest without contrast. And also Doppler of  the lower extremity. Patient probably eventually will need Lasix. 5. Hypertension - will continue medications. As per the brother patient had low blood pressure last week. Closely follow blood pressure trends. 6. Dementia - continue home medications. 7. Chronic anemia - follow CBC.  X-ray of the knees are pending. MRI brain is pending. Palliative care consult for goals of care has been requested.   DVT ProphylaxisLovenox.   Code Status: Full code.   Family Communication: Discussed with patient's brother.   Disposition Plan: Admit for observation.   Feliz Lincoln N. [facility] Pager [phone].  If 7PM-7AM, please contact night-coverage [email or web address] Password [email or web address] 08/11/2015, 10:30 PM

## 2015-08-11 NOTE — ED Notes (Addendum)
Pt. Coming from assisted living facility after unwitnessed fall today. Pt. Found on the floor earlier today by staff. [place] and cause of fall is unknown. Pt. C/o pain in left lower leg, neck, and back. C-spine and back precautions upon EMS arrival. Pt. Very hard of hearing. Pt. AOX4.

## 2015-08-11 NOTE — ED Notes (Signed)
Report from previous RN states she attempted in and out urinary cath but unable to pass prostate gland; adult brief found to be dry; will perform bladder scan

## 2015-08-11 NOTE — ED Provider Notes (Signed)
CSN: [id number]     Arrival date & time 08/11/15  1503 History   First MD Initiated Contact with Patient 08/11/15 1516     Chief Complaint  Patient presents with  . Fall     (Consider location/radiation/quality/duration/timing/severity/associated sxs/prior Treatment) Patient is a 82 y.o. male presenting with fall. The history is provided by the patient and the EMS personnel.  Fall This is a new problem. The current episode started today. The problem occurs constantly. The problem has been unchanged. Associated symptoms include arthralgias, headaches, myalgias and neck pain. Pertinent negatives include no abdominal pain, anorexia, change in bowel habit, chest pain, chills, congestion, coughing, diaphoresis, fatigue, fever, joint swelling, nausea, numbness, rash, sore throat, swollen glands, urinary symptoms, vertigo, visual change or vomiting. Nothing aggravates the symptoms. He has tried nothing for the symptoms. The treatment provided no relief.    Past Medical History  Diagnosis Date  . Diabetes mellitus without complication ([place])   . Hypertension   . Hearing difficulty of both ears     Bilateral hearing aids  . Blindness     Severe visual impairment OD, Blind OS  . Abnormality of gait   . Peripheral edema   . Glaucoma   . Dyslipidemia   . Nontraumatic cerebral hemorrhage ([place])   . UTI (urinary tract infection)   . Generalized muscle weakness   . Renal disorder   . Dysphagia   . Cognitive communication deficit   . Encephalopathy   . Urinary retention   . Neoplasm of prostate   . Renal failure   . Hyperlipidemia    Past Surgical History  Procedure Laterality Date  . Eye surgery     Family History  Problem Relation Age of Onset  . Hypertension Mother   . Hypertension Father   . Diabetes Mellitus II Brother    Social History  Substance Use Topics  . Smoking status: Former Research scientist (life sciences)  . Smokeless tobacco: Never Used  . Alcohol Use: No    Review of Systems   Constitutional: Negative for fever, chills, diaphoresis and fatigue.  HENT: Negative for congestion, drooling and sore throat.   Eyes: Negative for pain and visual disturbance.  Respiratory: Negative for cough and chest tightness.   Cardiovascular: Negative for chest pain.  Gastrointestinal: Negative for nausea, vomiting, abdominal pain, diarrhea, anorexia and change in bowel habit.  Genitourinary: Negative for dysuria.  Musculoskeletal: Positive for myalgias, back pain, arthralgias, gait problem (Pt has gait problem at baseline) and neck pain. Negative for joint swelling.  Skin: Negative for rash and wound.  Neurological: Positive for headaches. Negative for vertigo, speech difficulty and numbness.  Psychiatric/Behavioral: Negative for confusion.      Allergies  Review of patient's allergies indicates no known allergies.  Home Medications   Prior to Admission medications   Medication Sig Start Date End Date Taking? Authorizing Provider  acetaminophen (TYLENOL) 650 MG CR tablet Take 650 mg by mouth every 8 (eight) hours.   Yes Historical Provider, MD  atorvastatin (LIPITOR) 10 MG tablet Take 10 mg by mouth at bedtime.   Yes Historical Provider, MD  brimonidine (ALPHAGAN) 0.2 % ophthalmic solution Place 1 drop into both eyes 3 (three) times daily.   Yes Historical Provider, MD  carvedilol (COREG) 6.25 MG tablet Take 6.25 mg by mouth 2 (two) times daily. With food. 08/23/14  Yes Historical Provider, MD  donepezil (ARICEPT) 10 MG tablet Take 10 mg by mouth 2 (two) times daily.    Yes Historical Provider, MD  dorzolamide-timolol (COSOPT) 22.3-6.8 MG/ML ophthalmic solution Place 1 drop into the right eye 2 (two) times daily.   Yes Historical Provider, MD  finasteride (PROSCAR) 5 MG tablet Take 5 mg by mouth daily. 10/11/14  Yes Historical Provider, MD  guaiFENesin (MUCINEX) 600 MG 12 hr tablet Take 600 mg by mouth 2 (two) times daily as needed for to loosen phlegm.   Yes Historical Provider,  MD  LORazepam (ATIVAN) 0.5 MG tablet Take 0.5 mg by mouth at bedtime as needed (insomnia).   Yes Historical Provider, MD  megestrol (MEGACE) 400 MG/10ML suspension Take 10 mLs (400 mg total) by mouth 2 (two) times daily. 08/06/15  Yes Orlie Dakin, MD  memantine (NAMENDA) 10 MG tablet Take 10 mg by mouth 2 (two) times daily.   Yes Historical Provider, MD  mirtazapine (REMERON) 15 MG tablet Take 15 mg by mouth at bedtime.   Yes Historical Provider, MD  NUTRITIONAL SUPPLEMENT LIQD Take 1 Can by mouth 3 (three) times daily. Between meals. "Med Pass."   Yes Historical Provider, MD  senna-docusate (SENOKOT-S) 8.6-50 MG per tablet Take 1 tablet by mouth at bedtime as needed for mild constipation. 03/22/15  Yes [name], MD  traMADol (ULTRAM) 50 MG tablet Take 50 mg by mouth 2 (two) times daily.   Yes Historical Provider, MD  verapamil (CALAN-SR) 240 MG CR tablet Take 240 mg by mouth daily.    Yes Historical Provider, MD   BP 153/77 mmHg  Pulse 94  Temp(Src) 98.3 F (36.8 C) (Oral)  Resp 16  Wt 102.967 kg  SpO2 99% Physical Exam  Constitutional: He appears well-developed and well-nourished. He does not appear ill. No distress.  HENT:  Head: Head is without abrasion, without contusion and without laceration.  Eyes: Conjunctivae and EOM are normal.  Neck: Trachea normal. Muscular tenderness present. No rigidity. No edema present.  Cardiovascular: Normal rate.  An irregular rhythm present. Exam reveals no decreased pulses.   No murmur heard. Pulmonary/Chest: Effort normal and breath sounds normal.  Abdominal: Normal appearance. He exhibits no distension and no ascites. There is no tenderness. There is no rigidity, no rebound, no guarding and no CVA tenderness.  Musculoskeletal:       Right hip: He exhibits no tenderness, no bony tenderness, no swelling and no deformity.       Left hip: He exhibits no tenderness, no bony tenderness, no swelling and no deformity.       Cervical back: He exhibits  tenderness and pain. He exhibits no edema, no deformity and no laceration.       Lumbar back: He exhibits tenderness and pain. He exhibits no edema and no deformity.       Left lower leg: He exhibits tenderness. He exhibits no swelling, no edema and no deformity.  Neurological: He is alert. He has normal strength. No sensory deficit. GCS eye subscore is 4. GCS verbal subscore is 5. GCS motor subscore is 6.  Pt has significant decreased hearing at baseline and left eye blindness at baseline  Skin: Skin is warm and dry. No abrasion and no rash noted.  Psychiatric: He has a normal mood and affect. His speech is normal.    ED Course  Procedures (including critical care time) Labs Review Labs Reviewed  CBC WITH DIFFERENTIAL/PLATELET - Abnormal; Notable for the following:    WBC 15.4 (*)    RBC 4.16 (*)    Hemoglobin 12.0 (*)    HCT 35.9 (*)    RDW 16.2 (*)  Neutro Abs 11.8 (*)    Monocytes Absolute 1.5 (*)    All other components within normal limits  BASIC METABOLIC PANEL - Abnormal; Notable for the following:    BUN 27 (*)    Creatinine, Ser 1.84 (*)    GFR calc non Af Amer 32 (*)    GFR calc Af Amer 37 (*)    All other components within normal limits  URINE CULTURE  CK  URINALYSIS, ROUTINE W REFLEX MICROSCOPIC (NOT AT [facility])  I-STAT TROPOININ, ED  I-STAT TROPOININ, ED    Imaging Review Ct Abdomen Pelvis Wo Contrast  08/11/2015  CLINICAL DATA:  Unwitnessed fall EXAM: CT ABDOMEN AND PELVIS WITHOUT CONTRAST TECHNIQUE: Multidetector CT imaging of the abdomen and pelvis was performed following the standard protocol without IV contrast. COMPARISON:  08/11/2015 FINDINGS: Lower chest: There is an at least moderate partially visualized left pleural effusion. There is a small right pleural effusion. Bilaterally there is underlying atelectasis. Hepatobiliary: Mild sludge layering dependently in the gallbladder. Gallbladder is mildly distended with no evidence of inflammation surrounding it.  Liver is normal. Pancreas: Normal Spleen: Normal Adrenals/Urinary Tract: There is moderate to severe dilatation of the left ureter and moderate left hydronephrosis. Several tiny nonobstructing left renal calculi are noted. There is evidence of a large lobulated soft tissue mass involving the mid to inferior left aspect of the bladder measuring about 7 by 7 x 5.5 cm. Stomach/Bowel: Nonobstructive bowel gas pattern. Diverticulosis of the sigmoid colon. Mild to moderate fecal impaction in the rectum which is distended to a diameter of 7.5 cm. Vascular/Lymphatic: Atherosclerotic aortoiliac calcification. Soft tissue fullness in the retroperitoneum in both the. Aortic and aortocaval region. This suggests adenopathy as described on report of CT scan performed 08/11/2015. There is bulky left pelvic sidewall adenopathy. Reproductive: The mass involving the left inferior bladder could arise from either the prostate or the bladder. Musculoskeletal: Numerous sclerotic lesions throughout the thoracolumbar spine and sacrum. Sclerotic lesions are also seen in the iliac bones and sacrum. IMPRESSION: Evidence of carcinoma arising either from the prostate or the bladder, but involving both organs and causing left hydronephrosis. Evidence of metastatic adenopathy in the retroperitoneum and in the pelvis. Bilateral pleural effusions. Electronically Signed   By: Skipper Cliche M.D.   On: 08/11/2015 20:19   Dg Tibia/fibula Left  08/11/2015  CLINICAL DATA:  Recent fall, initial encounter EXAM: LEFT TIBIA AND FIBULA - 2 VIEW COMPARISON:  None. FINDINGS: No acute fracture or dislocation is noted. There is some bony bridging between the distal tibia and distal fibula. No joint effusion is seen no soft tissue abnormalities are noted. IMPRESSION: No acute abnormality seen. Electronically Signed   By: Inez Catalina M.D.   On: 08/11/2015 17:34   Ct Head Wo Contrast  08/11/2015  CLINICAL DATA:  Unwitnessed fall with headaches and neck pain,  initial encounter EXAM: CT HEAD WITHOUT CONTRAST CT CERVICAL SPINE WITHOUT CONTRAST TECHNIQUE: Multidetector CT imaging of the head and cervical spine was performed following the standard protocol without intravenous contrast. Multiplanar CT image reconstructions of the cervical spine were also generated. COMPARISON:  08/06/2015 FINDINGS: CT HEAD FINDINGS Bony calvarium is intact. Postsurgical changes are noted in the right orbit. Mild atrophic changes are noted. Mild chronic white matter ischemic change is seen. No findings to suggest acute hemorrhage, acute infarction or space-occupying mass lesion are noted. CT CERVICAL SPINE FINDINGS Seven cervical segments are well visualized. Vertebral body height is well maintained. Osteophytic changes noted from C3-C7. Facet hypertrophic  changes are noted. No acute fracture or acute facet abnormality is noted. Surrounding soft tissue structures show evidence of a left-sided pleural effusion of relatively large size. IMPRESSION: CT of the head:  No acute intracranial abnormality noted. CT of the cervical spine: Degenerative changes without acute bony abnormality. Large left pleural effusion. Electronically Signed   By: Inez Catalina M.D.   On: 08/11/2015 16:53   Ct Cervical Spine Wo Contrast  08/11/2015  CLINICAL DATA:  Unwitnessed fall with headaches and neck pain, initial encounter EXAM: CT HEAD WITHOUT CONTRAST CT CERVICAL SPINE WITHOUT CONTRAST TECHNIQUE: Multidetector CT imaging of the head and cervical spine was performed following the standard protocol without intravenous contrast. Multiplanar CT image reconstructions of the cervical spine were also generated. COMPARISON:  08/06/2015 FINDINGS: CT HEAD FINDINGS Bony calvarium is intact. Postsurgical changes are noted in the right orbit. Mild atrophic changes are noted. Mild chronic white matter ischemic change is seen. No findings to suggest acute hemorrhage, acute infarction or space-occupying mass lesion are noted.  CT CERVICAL SPINE FINDINGS Seven cervical segments are well visualized. Vertebral body height is well maintained. Osteophytic changes noted from C3-C7. Facet hypertrophic changes are noted. No acute fracture or acute facet abnormality is noted. Surrounding soft tissue structures show evidence of a left-sided pleural effusion of relatively large size. IMPRESSION: CT of the head:  No acute intracranial abnormality noted. CT of the cervical spine: Degenerative changes without acute bony abnormality. Large left pleural effusion. Electronically Signed   By: Inez Catalina M.D.   On: 08/11/2015 16:53   Ct Lumbar Spine Wo Contrast  08/11/2015  CLINICAL DATA:  Status post unwitnessed fall today. Lower extremity and low back pain. Initial encounter. EXAM: CT LUMBAR SPINE WITHOUT CONTRAST TECHNIQUE: Multidetector CT imaging of the lumbar spine was performed without intravenous contrast administration. Multiplanar CT image reconstructions were also generated. COMPARISON:  None. FINDINGS: Vertebral body height and alignment are maintained. A sclerotic lesion in the L3 vertebral body to the right measures 2.8 cm AP by 2.1 cm transverse. Several punctate sclerotic lesions are also identified in the left ilium. Finally, a round sclerotic lesion is seen in the left side of the T11 vertebral body anterior to the pedicle measuring 1.3 cm AP by 1.1 cm transverse. Paraspinous structures demonstrate a small right and small to moderate left pleural effusion. Neither effusion is completely visualized. Bronchiectatic changes seen the right lower lobe. The patient has extensive retroperitoneal lymphadenopathy. Index paraaortic nodal mass measures approximately 3.4 x 2.9 cm on image 90 tripped. Inter-aortocaval node on image 90 measures approximately 2.1 cm short axis dimension. The patient has mild to moderate left hydronephrosis. Cause for obstruction is not discretely identified but the left ureter is not seen in its entirety. There  appears to be bladder wall thickening eccentric to the left. Two small nonobstructing stones are identified the left kidney. There is scattered facet degenerative disease lumbar spine. Minimal disc bulge and endplate spurring are noted at L3-4, L4-5 and L5-S1. IMPRESSION: Negative for fracture. Findings consistent with metastatic neoplastic process with extensive abdominal and pelvic lymphadenopathy and sclerotic bone lesions identified. There is left hydronephrosis with partial visualization of a lesion in the left side of the urinary bladder. Most likely differential considerations are prostate or bladder carcinoma. These results were called by telephone at the time of interpretation on 08/11/2015 at 5:00 pm to Dr. Esaw Grandchild , who verbally acknowledged these results. Electronically Signed   By: Inge Rise M.D.   On: 08/11/2015 17:01  Dg Chest Port 1 View  08/11/2015  CLINICAL DATA:  Recent fall EXAM: PORTABLE CHEST - 1 VIEW COMPARISON:  08/06/2015 FINDINGS: Cardiac shadow is within normal limits. The lungs are well aerated bilaterally. No focal infiltrate or sizable effusion is noted. IMPRESSION: No acute abnormality noted. Electronically Signed   By: Inez Catalina M.D.   On: 08/11/2015 17:36   Dg Hips Bilat With Pelvis 2v  08/11/2015  CLINICAL DATA:  Status post fall. EXAM: DG HIP (WITH OR WITHOUT PELVIS) 2V BILAT COMPARISON:  None. FINDINGS: There is decreased bone mineralization. There is no evidence of hip fracture or dislocation. Mild to moderate bilateral hip joint osteoarthritic changes are seen, with joint space narrowing, subchondral sclerosis of the acetabula and osteophyte formation. IMPRESSION: No evidence of fracture or subluxation of bilateral hips. Electronically Signed   By: Fidela Salisbury M.D.   On: 08/11/2015 17:39   I have personally reviewed and evaluated these images and lab results as part of my medical decision-making.   EKG Interpretation None      MDM   Final  diagnoses:  Syncope and collapse  Hydronephrosis, unspecified hydronephrosis type  Malignancy (HCC)  Bilateral low back pain without sciatica    82 year old black male with past medical history of hypertension, CVA, left eye blindness presents from nursing facility in setting of being found down. Agent with a suspected fall but he is unable to give better details. Patient was down for an unknown amount of time. With EMS patient was reportedly complaining of neck and back pain as well as left lower leg pain. Patient without any obvious trauma on examination. Patient in c-collar on arrival.  Patient hemodynamically stable on arrival and moving all four extremities. Patient does report sensation intact. Patient with no obvious deformity on examination but does complain of pain in his lower back and neck. No tenderness to palpation of pelvis. No tenderness to chest or abdomen. EKG obtained which reveals sinus rhythm without signs of acute ischemia. No significant changes when compared to previous EKG. In setting of patient being found down obtain CT head, C-spine, lumbar spine. We'll obtain chest x-ray and pelvis x-ray. We'll obtain x-ray of left lower leg. We'll obtain laboratory analysis to include troponins, electrolytes, blood levels, CK. Additionally we'll obtain urinalysis. We'll reassess patient after labs and imaging completed.  No significant elevation in initial troponin. CK without elevation making rhabdomyolysis unlikely. Patient with slight elevation in white blood cell count but has persistent elevated white blood cell count. No significant anemia. No significant electrolyte abnormality. Patient has worsening kidney function when compared to baseline. X-ray of chest, pelvis, left lower leg did not reveal any fracture, malalignment, other significant abnormality. CT head and C-spine revealed no fracture or acute intracranial abnormality. CT of lumbar spine did not reveal any fractures or  malalignment lumbar spine however scan concerning for metastatic malignancy. Additionally there was concern on scan for left hydronephrosis with occlusion of left ureter. In setting of this finding CT abdomen pelvis ordered. Scan unable to be obtained with contrast due to patient's GFR. We will await results.  CT abdomen pelvis be demonstrated left hydronephrosis and concern for metastatic malignancy. In setting of patient's possible syncope and being found down will admit to medicine at this time for further management. I discussed case with Dr. Hal Hope. Patient stable at time of admission.  Attending has seen and evaluated patient and Dr. Lita Mains is in agreement with plan.  Esaw Grandchild, MD 08/11/15 [id number]  Julianne Rice, MD  08/13/15 1626 

## 2015-08-11 NOTE — ED Notes (Signed)
EDP at bedside  

## 2015-08-12 ENCOUNTER — Observation Stay (HOSPITAL_COMMUNITY): Payer: Medicare Other

## 2015-08-12 DIAGNOSIS — C801 Malignant (primary) neoplasm, unspecified: Secondary | ICD-10-CM | POA: Diagnosis not present

## 2015-08-12 DIAGNOSIS — N400 Enlarged prostate without lower urinary tract symptoms: Secondary | ICD-10-CM | POA: Diagnosis present

## 2015-08-12 DIAGNOSIS — R55 Syncope and collapse: Secondary | ICD-10-CM | POA: Diagnosis present

## 2015-08-12 DIAGNOSIS — E46 Unspecified protein-calorie malnutrition: Secondary | ICD-10-CM | POA: Diagnosis present

## 2015-08-12 DIAGNOSIS — C779 Secondary and unspecified malignant neoplasm of lymph node, unspecified: Secondary | ICD-10-CM | POA: Diagnosis present

## 2015-08-12 DIAGNOSIS — R630 Anorexia: Secondary | ICD-10-CM | POA: Diagnosis not present

## 2015-08-12 DIAGNOSIS — H409 Unspecified glaucoma: Secondary | ICD-10-CM | POA: Diagnosis present

## 2015-08-12 DIAGNOSIS — C7970 Secondary malignant neoplasm of unspecified adrenal gland: Secondary | ICD-10-CM | POA: Diagnosis present

## 2015-08-12 DIAGNOSIS — R627 Adult failure to thrive: Secondary | ICD-10-CM | POA: Diagnosis not present

## 2015-08-12 DIAGNOSIS — E43 Unspecified severe protein-calorie malnutrition: Secondary | ICD-10-CM | POA: Diagnosis present

## 2015-08-12 DIAGNOSIS — J90 Pleural effusion, not elsewhere classified: Secondary | ICD-10-CM | POA: Diagnosis present

## 2015-08-12 DIAGNOSIS — W19XXXA Unspecified fall, initial encounter: Secondary | ICD-10-CM | POA: Diagnosis not present

## 2015-08-12 DIAGNOSIS — R609 Edema, unspecified: Secondary | ICD-10-CM

## 2015-08-12 DIAGNOSIS — Z87891 Personal history of nicotine dependence: Secondary | ICD-10-CM | POA: Diagnosis not present

## 2015-08-12 DIAGNOSIS — C7951 Secondary malignant neoplasm of bone: Secondary | ICD-10-CM | POA: Diagnosis present

## 2015-08-12 DIAGNOSIS — R41841 Cognitive communication deficit: Secondary | ICD-10-CM | POA: Diagnosis present

## 2015-08-12 DIAGNOSIS — Z515 Encounter for palliative care: Secondary | ICD-10-CM | POA: Diagnosis not present

## 2015-08-12 DIAGNOSIS — F0390 Unspecified dementia without behavioral disturbance: Secondary | ICD-10-CM | POA: Diagnosis not present

## 2015-08-12 DIAGNOSIS — I10 Essential (primary) hypertension: Secondary | ICD-10-CM | POA: Diagnosis not present

## 2015-08-12 DIAGNOSIS — Z6823 Body mass index (BMI) 23.0-23.9, adult: Secondary | ICD-10-CM | POA: Diagnosis not present

## 2015-08-12 DIAGNOSIS — N320 Bladder-neck obstruction: Secondary | ICD-10-CM | POA: Diagnosis present

## 2015-08-12 DIAGNOSIS — Z993 Dependence on wheelchair: Secondary | ICD-10-CM | POA: Diagnosis not present

## 2015-08-12 DIAGNOSIS — E1122 Type 2 diabetes mellitus with diabetic chronic kidney disease: Secondary | ICD-10-CM | POA: Diagnosis present

## 2015-08-12 DIAGNOSIS — E785 Hyperlipidemia, unspecified: Secondary | ICD-10-CM | POA: Diagnosis present

## 2015-08-12 DIAGNOSIS — C7911 Secondary malignant neoplasm of bladder: Secondary | ICD-10-CM | POA: Diagnosis present

## 2015-08-12 DIAGNOSIS — Z8673 Personal history of transient ischemic attack (TIA), and cerebral infarction without residual deficits: Secondary | ICD-10-CM | POA: Diagnosis not present

## 2015-08-12 DIAGNOSIS — E860 Dehydration: Secondary | ICD-10-CM | POA: Diagnosis present

## 2015-08-12 DIAGNOSIS — N1330 Unspecified hydronephrosis: Secondary | ICD-10-CM | POA: Diagnosis present

## 2015-08-12 DIAGNOSIS — W19XXXD Unspecified fall, subsequent encounter: Secondary | ICD-10-CM | POA: Diagnosis not present

## 2015-08-12 DIAGNOSIS — C61 Malignant neoplasm of prostate: Secondary | ICD-10-CM | POA: Diagnosis not present

## 2015-08-12 DIAGNOSIS — N390 Urinary tract infection, site not specified: Secondary | ICD-10-CM | POA: Diagnosis present

## 2015-08-12 DIAGNOSIS — C7800 Secondary malignant neoplasm of unspecified lung: Secondary | ICD-10-CM | POA: Diagnosis present

## 2015-08-12 DIAGNOSIS — Y92129 Unspecified place in nursing home as the place of occurrence of the external cause: Secondary | ICD-10-CM | POA: Diagnosis not present

## 2015-08-12 DIAGNOSIS — I129 Hypertensive chronic kidney disease with stage 1 through stage 4 chronic kidney disease, or unspecified chronic kidney disease: Secondary | ICD-10-CM | POA: Diagnosis present

## 2015-08-12 DIAGNOSIS — Z87440 Personal history of urinary (tract) infections: Secondary | ICD-10-CM | POA: Diagnosis not present

## 2015-08-12 DIAGNOSIS — N183 Chronic kidney disease, stage 3 (moderate): Secondary | ICD-10-CM | POA: Diagnosis present

## 2015-08-12 DIAGNOSIS — D649 Anemia, unspecified: Secondary | ICD-10-CM | POA: Diagnosis present

## 2015-08-12 DIAGNOSIS — N179 Acute kidney failure, unspecified: Secondary | ICD-10-CM | POA: Diagnosis not present

## 2015-08-12 DIAGNOSIS — M545 Low back pain: Secondary | ICD-10-CM | POA: Diagnosis present

## 2015-08-12 LAB — CBC
HEMATOCRIT: 33.2 % — AB (ref 39.0–52.0)
HEMOGLOBIN: 10.9 g/dL — AB (ref 13.0–17.0)
MCH: 28.7 pg (ref 26.0–34.0)
MCHC: 32.8 g/dL (ref 30.0–36.0)
MCV: 87.4 fL (ref 78.0–100.0)
Platelets: 357 10*3/uL (ref 150–400)
RBC: 3.8 MIL/uL — ABNORMAL LOW (ref 4.22–5.81)
RDW: 16.5 % — AB (ref 11.5–15.5)
WBC: 12.4 10*3/uL — ABNORMAL HIGH (ref 4.0–10.5)

## 2015-08-12 LAB — COMPREHENSIVE METABOLIC PANEL
ALBUMIN: 2.6 g/dL — AB (ref 3.5–5.0)
ALK PHOS: 270 U/L — AB (ref 38–126)
ALT: 7 U/L — AB (ref 17–63)
ANION GAP: 9 (ref 5–15)
AST: 29 U/L (ref 15–41)
BILIRUBIN TOTAL: 0.5 mg/dL (ref 0.3–1.2)
BUN: 26 mg/dL — AB (ref 6–20)
CHLORIDE: 109 mmol/L (ref 101–111)
CO2: 24 mmol/L (ref 22–32)
Calcium: 9.1 mg/dL (ref 8.9–10.3)
Creatinine, Ser: 1.69 mg/dL — ABNORMAL HIGH (ref 0.61–1.24)
GFR calc non Af Amer: 35 mL/min — ABNORMAL LOW (ref >60)
GFR, EST AFRICAN AMERICAN: 41 mL/min — AB (ref >60)
GLUCOSE: 82 mg/dL (ref 65–99)
Potassium: 4 mmol/L (ref 3.5–5.1)
Sodium: 142 mmol/L (ref 135–145)
Total Protein: 7.2 g/dL (ref 6.5–8.1)

## 2015-08-12 LAB — GLUCOSE, CAPILLARY
GLUCOSE-CAPILLARY: 91 mg/dL (ref 65–99)
GLUCOSE-CAPILLARY: 86 mg/dL (ref 65–99)
GLUCOSE-CAPILLARY: 84 mg/dL (ref 65–99)
Glucose-Capillary: 115 mg/dL — ABNORMAL HIGH (ref 65–99)
Glucose-Capillary: 66 mg/dL (ref 65–99)

## 2015-08-12 LAB — TROPONIN I
Troponin I: 0.04 ng/mL — ABNORMAL HIGH (ref <0.031)
Troponin I: 0.04 ng/mL — ABNORMAL HIGH (ref <0.031)

## 2015-08-12 LAB — PSA: PSA: 539 ng/mL — AB (ref 0.00–4.00)

## 2015-08-12 MED ORDER — DEGARELIX ACETATE 120 MG ~~LOC~~ SOLR
240.0000 mg | Freq: Once | SUBCUTANEOUS | Status: AC
  Administered 2015-08-13: 240 mg via SUBCUTANEOUS
  Filled 2015-08-12: qty 6

## 2015-08-12 MED ORDER — DEGARELIX ACETATE 120 MG ~~LOC~~ SOLR
240.0000 mg | Freq: Once | SUBCUTANEOUS | Status: DC
  Filled 2015-08-12: qty 6

## 2015-08-12 MED ORDER — DEXTROSE-NACL 5-0.45 % IV SOLN
INTRAVENOUS | Status: DC
  Administered 2015-08-12 – 2015-08-13 (×2): via INTRAVENOUS

## 2015-08-12 MED ORDER — DEXTROSE 50 % IV SOLN
25.0000 mL | Freq: Once | INTRAVENOUS | Status: AC
  Administered 2015-08-12: 25 mL via INTRAVENOUS
  Filled 2015-08-12: qty 50

## 2015-08-12 MED ORDER — DEXTROSE 5 % IV SOLN
1.0000 g | INTRAVENOUS | Status: DC
  Administered 2015-08-12 – 2015-08-14 (×3): 1 g via INTRAVENOUS
  Filled 2015-08-12 (×4): qty 10

## 2015-08-12 MED ORDER — DEXTROSE 50 % IV SOLN
25.0000 mL | Freq: Once | INTRAVENOUS | Status: AC
  Administered 2015-08-12: 25 mL via INTRAVENOUS

## 2015-08-12 NOTE — Progress Notes (Signed)
OT Cancellation Note  Patient Details Name: Kent Mayo MRN: [id number] DOB: 08/29/1926   Cancelled Treatment:    Reason Eval/Treat Not Completed: Patient's level of consciousness. Upon entering room, pt asleep in bed. Pt would not fully wake up for therapist. Pt would not open eyes and upon therapist attempting to have pt sit EOB, pt adamantly resisting. Will check back later for OT eval, possibly tomorrow.   Chrys Racer , MS, OTR/L, CLT Pager: [phone]  08/12/2015, 1:24 PM

## 2015-08-12 NOTE — Progress Notes (Signed)
ANTIBIOTIC CONSULT NOTE - INITIAL  Pharmacy Consult for Ceftriaxone  Indication: UTI  No Known Allergies  Patient Measurements: Height: 6\' 2"  (188 cm) Weight: 181 lb 12.8 oz (82.464 kg) IBW/kg (Calculated) : 82.2  Vital Signs: Temp: 97.7 F (36.5 C) (01/09 0000) Temp Source: Oral (01/08 1532) BP: 153/87 mmHg (01/08 2214) Pulse Rate: 90 (01/08 2214)  Labs:  Recent Labs  08/11/15 1716  WBC 15.4*  HGB 12.0*  PLT 398  CREATININE 1.84*   Estimated Creatinine Clearance: 33.5 mL/min (by C-G formula based on Cr of 1.84).   Medical History: Past Medical History  Diagnosis Date  . Diabetes mellitus without complication ([place])   . Hypertension   . Hearing difficulty of both ears     Bilateral hearing aids  . Blindness     Severe visual impairment OD, Blind OS  . Abnormality of gait   . Peripheral edema   . Glaucoma   . Dyslipidemia   . Nontraumatic cerebral hemorrhage ([place])   . UTI (urinary tract infection)   . Generalized muscle weakness   . Renal disorder   . Dysphagia   . Cognitive communication deficit   . Encephalopathy   . Urinary retention   . Neoplasm of prostate   . Renal failure   . Hyperlipidemia    Assessment: 82 y/o M here with fall, WBC elevated, abnormal U/A, starting ceftriaxone for UTI  Plan:  -Ceftriaxone 1g IV q24h -F/U urine culture  Narda Bonds 08/12/2015,1:13 AM

## 2015-08-12 NOTE — Progress Notes (Signed)
Hypoglycemic Event  CBG: 70  Treatment: D50 IV 25 mL  Symptoms: Nervous/irritable  Follow-up CBG: Time:0110 CBG Result:115  Possible Reasons for Event: Inadequate meal intake  Comments/MD notified:    Jodell Cipro

## 2015-08-12 NOTE — Evaluation (Signed)
Physical Therapy Evaluation Patient Details Name: Kent Mayo MRN: [id number] DOB: [birth year] Today's Date: 08/12/2015   History of Present Illness  Patient is a 82 y.o. male presenting with fall. CT reveals no acute changes. MRI pending. PMH: CVA, HTN, DM, legally blind and very HOH. Pt is from ALF.    Clinical Impression  Pt very HOH and nearly blind, but does seem to hear some from L ear and attempts to participate in mobility with PT.  Pt unable to even come to sitting at EOB due to sharp pain in L anterior hip/groin area with LE movement.  RN aware of pain.  Noted pt is from ALF, but feel at this time pt will need SNF level of care unless his ALF is capable of providing total - maxA for ADLs and mobility.  Will continue to follow.      Follow Up Recommendations SNF    Equipment Recommendations  None recommended by PT    Recommendations for Other Services       Precautions / Restrictions Precautions Precautions: Fall Precaution Comments: pt blind and very HOH. Restrictions Weight Bearing Restrictions: No      Mobility  Bed Mobility Overal bed mobility: Needs Assistance;+2 for physical assistance Bed Mobility: Supine to Sit     Supine to sit: Total assist;+2 for physical assistance;HOB elevated     General bed mobility comments: Attempted coming to sitting EOB, however pt very painful in L hip/groin.  pt does move R LE and utilizes Bil UEs to A with mobility, but no active movement in L LE.  pt unable to achieve full sitting at EOB due to hip pain.    Transfers                    Ambulation/Gait                Stairs            Wheelchair Mobility    Modified Rankin (Stroke Patients Only)       Balance Overall balance assessment: Needs assistance                                           Pertinent Vitals/Pain Pain Assessment: Faces Faces Pain Scale: Hurts whole lot Pain Location: Denied pain, but when attempting  mobility yelled out and grabbed his L hip/groin Pain Descriptors / Indicators: Crying;Grimacing;Guarding Pain Intervention(s): Monitored during session;Repositioned;Patient requesting pain meds-RN notified    Home Living Family/patient expects to be discharged to:: Assisted living                 Additional Comments: Per SW pt is from [place].    Prior Function Level of Independence: Needs assistance   Gait / Transfers Assistance Needed: Per chart pt is W/C bound.     Comments: pt unable to provide info about L_PLOF.       Hand Dominance        Extremity/Trunk Assessment   Upper Extremity Assessment: Defer to OT evaluation           Lower Extremity Assessment: LLE deficits/detail;Difficult to assess due to impaired cognition   LLE Deficits / Details: Minimal active movement in entire LE.  Very painful and anterior hip/groin with any hip movements.       Communication   Communication: HOH (Very HOH and blind)  Cognition  Arousal/Alertness: Awake/alert Behavior During Therapy: Flat affect Overall Cognitive Status: Difficult to assess                      General Comments      Exercises        Assessment/Plan    PT Assessment Patient needs continued PT services  PT Diagnosis Difficulty walking;Generalized weakness;Acute pain   PT Problem List Decreased strength;Decreased range of motion;Decreased activity tolerance;Decreased balance;Decreased mobility;Decreased coordination;Decreased cognition;Decreased knowledge of use of DME;Decreased safety awareness;Pain  PT Treatment Interventions DME instruction;Gait training;Functional mobility training;Therapeutic activities;Therapeutic exercise;Balance training;Cognitive remediation;Patient/family education   PT Goals (Current goals can be found in the Care Plan section) Acute Rehab PT Goals Patient Stated Goal: pt unable to state. PT Goal Formulation: Patient unable to participate in goal  setting Time For Goal Achievement: 08/26/15 Potential to Achieve Goals: Fair    Frequency Min 2X/week   Barriers to discharge        Co-evaluation               End of Session   Activity Tolerance: Patient limited by pain Patient left: in bed;with call bell/phone within reach;with bed alarm set Nurse Communication: Mobility status (L hip pain)    Functional Assessment Tool Used: Clinicaql Judgement Functional Limitation: Mobility: Walking and moving around Mobility: Walking and Moving Around Current Status [id number]): 100 percent impaired, limited or restricted Mobility: Walking and Moving Around Goal Status [id number]): At least 40 percent but less than 60 percent impaired, limited or restricted    Time: 0918-0931 PT Time Calculation (min) (ACUTE ONLY): 13 min   Charges:   PT Evaluation $PT Eval Moderate Complexity: 1 Procedure     PT G Codes:   PT G-Codes **NOT FOR INPATIENT CLASS** Functional Assessment Tool Used: Clinicaql Judgement Functional Limitation: Mobility: Walking and moving around Mobility: Walking and Moving Around Current Status [id number]): 100 percent impaired, limited or restricted Mobility: Walking and Moving Around Goal Status [id number]): At least 40 percent but less than 60 percent impaired, limited or restricted    Catarina Hartshorn, [place] 08/12/2015, 11:51 AM

## 2015-08-12 NOTE — Clinical Social Work Note (Signed)
Clinical Social Work Assessment  Patient Details  Name: Kent Mayo MRN: [id number] Date of Birth: March 31, 1927  Date of referral:  08/12/15               Reason for consult:  Facility Placement, Discharge Planning                Permission sought to share information with:  Facility Sport and exercise psychologist, Family Supports Permission granted to share information::  No (Patient confused.)  Name::     Chief Executive Officer::  [employer]  Relationship::     Contact Information:     Housing/Transportation Living arrangements for the past 2 months:  [place] of Information:  Other (Comment Required) (Brother Arts development officer) Patient Interpreter Needed:  None Criminal Activity/Legal Involvement Pertinent to Current Situation/Hospitalization:  No - Comment as needed Significant Relationships:  Siblings Lives with:  Facility Resident Do you feel safe going back to the place where you live?  Yes Need for family participation in patient care:  Yes (Comment)  Care giving concerns:  Patient's brother Rolan Bucco insists that the patient return to [facility]F at discharge. He is not agreeable to SNF placement.    Social Worker assessment / plan:  CSW spoke with patient's brother by phone to complete assessment. Per brother Rolan Bucco the patient is a resident of [facility]. Rolan Bucco states that he plans for the patient to return to the facility at discharge. CSW explained that it will be up to the facility to clear the patient for return to their facility. Rolan Bucco states that the facility has been trying to get Rolan Bucco to transition the patient to a SNF but Rolan Bucco does not agree that this is appropriate. CSW explained ALF/SNF placement process and answered the brother's questions. CSW will followup after facility responds.  Employment status:  Retired Forensic scientist:  Medicare PT Recommendations:  Not assessed at this time [employer] / Referral to community resources:   Other (Comment Required) (Information will be sent to [facility]  Patient/Family's Response to care:  Patient's brother is happy with the care the patient has received.  Patient/Family's Understanding of and Emotional Response to Diagnosis, Current Treatment, and Prognosis:  Patient's brother appears to have a fair understanding of reason for patient's admission. Patient's brother may have unrealistic expectations of [facility] as the facility has tried to get the family to take the patient to a SNF. Brother claims that the facility "just doesn't want to deal with him anymore." He also mentions that the cost of privately paying for SNF is too much at this time until the patient can qualify for Medicaid.   Emotional Assessment Appearance:  Appears stated age Attitude/Demeanor/Rapport:  Unable to Assess (Patient is confused.) Affect (typically observed):  Unable to Assess (Patient is confused.) Orientation:  Oriented to Self Alcohol / Substance use:  Never Used Psych involvement (Current and /or in the community):  No (Comment)  Discharge Needs  Concerns to be addressed:  Discharge Planning Concerns Readmission within the last 30 days:  No Current discharge risk:  Cognitively Impaired, Chronically ill, Physical Impairment Barriers to Discharge:  Continued Medical Work up   [employer] MSW, [place], [place], [id number]

## 2015-08-12 NOTE — Progress Notes (Signed)
Patient Demographics  Kent Mayo, is a 82 y.o. male, DOB - November 06, 1926, [id number]  Admit date - 08/11/2015   Admitting Physician Rise Patience, MD  Outpatient Primary MD for the patient is No primary care provider on file.  LOS - 1   Chief Complaint  Patient presents with  . Fall       Admission HPI/Brief narrative: 82 y.o. male with history of dementia, hypertension, chronic anemia, chronic kidney disease and legally blind , brought to the ER for  fall at his nursing home, as well failure to thrive, workup was significant for bladder/prostate mass, with left-sided hydronephrosis ,with what appears to be metastatic disease with bone involvement, liver and lung.  Subjective:   [name] today is confused, and can't provide any complaints.   Assessment & Plan    Principal Problem:   Fall at nursing home Active Problems:   HTN (hypertension)   FTT (failure to thrive) in adult   Renal failure (ARF), acute on chronic ([facility])   Fall  Fall - Most likely related to dehydration, failure to thrive, UTI, had metastatic cancer. - To be seen by PT.  Failure to thrive/protein calorie malnutrition - Patient appears to be malnourished, this is most likely related to his metastatic cancer, with known baseline dementia, will consult nutrition, continue with ensure. - Patient with episode of low blood sugar at 68, will start on D5 half-normal saline.  UTI - We'll start on Rocephin, follow on urine cultures  Metastatic disease - Imaging obtained showing evidence of prostate versus bladder cancer, with metastasis involving lung, liver, adrenal glands, and bones. - MRI brain still pending, patient refused MRI earlier today. - Discussed with Dr. Jeffie Pollock, will obtain PSA, and they will consult.  Dementia - Continue with home medication  Hypertension - Blood pressure acceptable, continue  with home medication.  chronic renal disease stage III - Baseline creatinine 1.6, today is 1.6, on admission 1.8, continue with gentle hydration  Code Status: Full  Family Communication: Spoke with brother  , who is POA, will meet him this afternoon .  Disposition Plan: pending further work up.   Procedures  none   Consults   Palliative and Urology pending.   Medications  Scheduled Meds: . atorvastatin  10 mg Oral QHS  . brimonidine  1 drop Both Eyes TID  . carvedilol  6.25 mg Oral BID WC  . cefTRIAXone (ROCEPHIN)  IV  1 g Intravenous Q24H  . donepezil  10 mg Oral BID  . dorzolamide-timolol  1 drop Right Eye BID  . enoxaparin (LOVENOX) injection  40 mg Subcutaneous Daily  . feeding supplement (ENSURE ENLIVE)  237 mL Oral TID BM  . finasteride  5 mg Oral Daily  . megestrol  400 mg Oral BID  . memantine  10 mg Oral BID  . mirtazapine  15 mg Oral QHS  . traMADol  50 mg Oral BID  . verapamil  240 mg Oral Daily   Continuous Infusions: . dextrose 5 % and 0.45% NaCl 50 mL/hr at 08/12/15 1133   PRN Meds:.guaiFENesin, LORazepam, senna-docusate  DVT Prophylaxis  Lovenox   Lab Results  Component Value Date   PLT 357 08/12/2015    Antibiotics    Anti-infectives  Start     Dose/Rate Route Frequency Ordered Stop   08/12/15 0115  cefTRIAXone (ROCEPHIN) 1 g in dextrose 5 % 50 mL IVPB     1 g 100 mL/hr over 30 Minutes Intravenous Every 24 hours 08/12/15 0113            Objective:   Filed Vitals:   08/11/15 2100 08/11/15 2214 08/12/15 0000 08/12/15 0500  BP: 154/95 153/87  145/77  Pulse: 90 90  82  Temp:  97.7 F (36.5 C) 97.7 F (36.5 C) 99.7 F (37.6 C)  TempSrc:      Resp: 18 22  17   Height:  6\' 2"  (1.88 m)    Weight:  82.464 kg (181 lb 12.8 oz)  82.328 kg (181 lb 8 oz)  SpO2: 99% 100%  100%    Wt Readings from Last 3 Encounters:  08/12/15 82.328 kg (181 lb 8 oz)  04/01/15 103.4 kg (227 lb 15.3 oz)  03/22/15 103.9 kg (229 lb 0.9 oz)      Intake/Output Summary (Last 24 hours) at 08/12/15 1245 Last data filed at 08/12/15 0500  Gross per 24 hour  Intake     50 ml  Output      0 ml  Net     50 ml     Physical Exam  Awake, confused, does not follow commands . Legally blind. Supple Neck,No JVD, Symmetrical Chest wall movement, Good air movement bilaterally,  RRR,No Gallops,Rubs or new Murmurs, No Parasternal Heave +ve B.Sounds, Abd Soft, No tenderness,  No rebound - guarding or rigidity. No Cyanosis, Clubbing or edema, No new Rash or bruise     Data Review   Micro Results No results found for this or any previous visit (from the past 240 hour(s)).  Radiology Reports Ct Abdomen Pelvis Wo Contrast  08/11/2015  CLINICAL DATA:  Unwitnessed fall EXAM: CT ABDOMEN AND PELVIS WITHOUT CONTRAST TECHNIQUE: Multidetector CT imaging of the abdomen and pelvis was performed following the standard protocol without IV contrast. COMPARISON:  08/11/2015 FINDINGS: Lower chest: There is an at least moderate partially visualized left pleural effusion. There is a small right pleural effusion. Bilaterally there is underlying atelectasis. Hepatobiliary: Mild sludge layering dependently in the gallbladder. Gallbladder is mildly distended with no evidence of inflammation surrounding it. Liver is normal. Pancreas: Normal Spleen: Normal Adrenals/Urinary Tract: There is moderate to severe dilatation of the left ureter and moderate left hydronephrosis. Several tiny nonobstructing left renal calculi are noted. There is evidence of a large lobulated soft tissue mass involving the mid to inferior left aspect of the bladder measuring about 7 by 7 x 5.5 cm. Stomach/Bowel: Nonobstructive bowel gas pattern. Diverticulosis of the sigmoid colon. Mild to moderate fecal impaction in the rectum which is distended to a diameter of 7.5 cm. Vascular/Lymphatic: Atherosclerotic aortoiliac calcification. Soft tissue fullness in the retroperitoneum in both the. Aortic  and aortocaval region. This suggests adenopathy as described on report of CT scan performed 08/11/2015. There is bulky left pelvic sidewall adenopathy. Reproductive: The mass involving the left inferior bladder could arise from either the prostate or the bladder. Musculoskeletal: Numerous sclerotic lesions throughout the thoracolumbar spine and sacrum. Sclerotic lesions are also seen in the iliac bones and sacrum. IMPRESSION: Evidence of carcinoma arising either from the prostate or the bladder, but involving both organs and causing left hydronephrosis. Evidence of metastatic adenopathy in the retroperitoneum and in the pelvis. Bilateral pleural effusions. Electronically Signed   By: Skipper Cliche M.D.   On:  08/11/2015 20:19   Dg Chest 2 View  08/06/2015  CLINICAL DATA:  Productive cough for 3 days with clear thick sputum. EXAM: CHEST  2 VIEW COMPARISON:  07/04/2015. FINDINGS: Telemetry leads overlie the chest. Normal cardiomediastinal silhouette. Bibasilar subsegmental atelectasis is suspected. No active infiltrates or failure can be observed. There is degenerative change both shoulders. IMPRESSION: Bibasilar subsegmental atelectasis is suspected. No active infiltrates or failure. Worsening aeration from priors. Electronically Signed   By: Staci Righter M.D.   On: 08/06/2015 11:21   Dg Knee 1-2 Views Left  08/12/2015  CLINICAL DATA:  Bilateral knee pain. EXAM: LEFT KNEE - 1-2 VIEW; RIGHT KNEE - 1-2 VIEW COMPARISON:  Left knee radiographs 08/11/2015 FINDINGS: Moderate tricompartmental degenerative changes bilaterally. No acute bony findings or osteochondral abnormality. No definite joint effusion. IMPRESSION: Moderate degenerative changes but no acute bony findings or joint effusion. Electronically Signed   By: Marijo Sanes M.D.   On: 08/12/2015 08:09   Dg Knee 1-2 Views Right  08/12/2015  CLINICAL DATA:  Bilateral knee pain. EXAM: LEFT KNEE - 1-2 VIEW; RIGHT KNEE - 1-2 VIEW COMPARISON:  Left knee  radiographs 08/11/2015 FINDINGS: Moderate tricompartmental degenerative changes bilaterally. No acute bony findings or osteochondral abnormality. No definite joint effusion. IMPRESSION: Moderate degenerative changes but no acute bony findings or joint effusion. Electronically Signed   By: Marijo Sanes M.D.   On: 08/12/2015 08:09   Dg Tibia/fibula Left  08/11/2015  CLINICAL DATA:  Recent fall, initial encounter EXAM: LEFT TIBIA AND FIBULA - 2 VIEW COMPARISON:  None. FINDINGS: No acute fracture or dislocation is noted. There is some bony bridging between the distal tibia and distal fibula. No joint effusion is seen no soft tissue abnormalities are noted. IMPRESSION: No acute abnormality seen. Electronically Signed   By: Inez Catalina M.D.   On: 08/11/2015 17:34   Ct Head Wo Contrast  08/11/2015  CLINICAL DATA:  Unwitnessed fall with headaches and neck pain, initial encounter EXAM: CT HEAD WITHOUT CONTRAST CT CERVICAL SPINE WITHOUT CONTRAST TECHNIQUE: Multidetector CT imaging of the head and cervical spine was performed following the standard protocol without intravenous contrast. Multiplanar CT image reconstructions of the cervical spine were also generated. COMPARISON:  08/06/2015 FINDINGS: CT HEAD FINDINGS Bony calvarium is intact. Postsurgical changes are noted in the right orbit. Mild atrophic changes are noted. Mild chronic white matter ischemic change is seen. No findings to suggest acute hemorrhage, acute infarction or space-occupying mass lesion are noted. CT CERVICAL SPINE FINDINGS Seven cervical segments are well visualized. Vertebral body height is well maintained. Osteophytic changes noted from C3-C7. Facet hypertrophic changes are noted. No acute fracture or acute facet abnormality is noted. Surrounding soft tissue structures show evidence of a left-sided pleural effusion of relatively large size. IMPRESSION: CT of the head:  No acute intracranial abnormality noted. CT of the cervical spine:  Degenerative changes without acute bony abnormality. Large left pleural effusion. Electronically Signed   By: Inez Catalina M.D.   On: 08/11/2015 16:53   Ct Head Wo Contrast  08/06/2015  CLINICAL DATA:  Altered mental status today.  Hypertension, UTI EXAM: CT HEAD WITHOUT CONTRAST TECHNIQUE: Contiguous axial images were obtained from the base of the skull through the vertex without intravenous contrast. COMPARISON:  07/04/2015 FINDINGS: There is atrophy and chronic small vessel disease changes. No acute intracranial abnormality. Specifically, no hemorrhage, hydrocephalus, mass lesion, acute infarction, or significant intracranial injury. No acute calvarial abnormality. IMPRESSION: No acute intracranial abnormality. Atrophy, chronic microvascular disease. Electronically  Signed   By: Rolm Baptise M.D.   On: 08/06/2015 11:11   Ct Chest Wo Contrast  08/12/2015  CLINICAL DATA:  82 year old male with failure to thrive. Prostate cancer. Pleural effusion. EXAM: CT CHEST WITHOUT CONTRAST TECHNIQUE: Multidetector CT imaging of the chest was performed following the standard protocol without IV contrast. COMPARISON:  08/11/2015 and 07/03/2025 chest x-ray FINDINGS: Moderate-size left-sided pleural effusion. Consolidation left lower lobe. No obstructing lesion of the bronchi of the left lower lobe and therefore this may represent passive atelectasis or infiltrate. Small right sided pleural effusion. Anterior right upper lobe and right middle lobe tiny nodules with suggestion of small calcifications may represent small granulomas although metastatic disease not excluded. Noncalcified left upper lobe 3 mm nodule of indeterminate etiology, metastatic disease not excluded. Mediastinal adenopathy. Index lymph nodes pretracheal region with transverse dimension of 2.6 x 1.9 cm and 2.7 x 2.4 cm. Preaortic adenopathy measures 2.3 x 1.9 cm. Retrocrural adenopathy measures 1.8 x 1.7 cm. Diffuse osseous metastatic disease. Sclerotic  metastatic foci seen throughout the lower cervical spine and throughout the thoracic spine upper lumbar spine. At the T9 level, circumferential soft tissue component tumor with possibly of epidural spread on the right. There may be epidural spread of tumor on the right at the T8 level and T6 level. Thoracic spine MR would be necessary for further delineation. Multiple metastatic rib lesions most destructive involving the posterior left sixth rib. Findings suspicious for 4.6 x 6.1 cm liver lesion although incompletely assessed on the present unenhanced CT chest. Mild enlargement of the right adrenal gland may represent hyperplasia or spread of tumor. Marked coronary artery calcifications. Heart size top-normal to slightly enlarged. 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 IMPRESSION: Moderate-size left-sided pleural effusion. Consolidation left lower lobe. No obstructing lesion of the bronchi of the left lower lobe and therefore this may represent passive atelectasis or infiltrate. Small right sided pleural effusion. Anterior right upper lobe and right middle lobe tiny nodules with suggestion of small calcifications may represent small granulomas although metastatic disease not excluded. Noncalcified left upper lobe 3 mm nodule of indeterminate etiology, metastatic disease not excluded. Mediastinal adenopathy.  Retrocrural adenopathy. Diffuse osseous metastatic disease. Sclerotic metastatic foci seen throughout the lower cervical spine and throughout the thoracic spine upper lumbar spine. At the T9 level, circumferential soft tissue component tumor with possibly of epidural spread on the right. There may be epidural spread of tumor on the right at the T8 level and T6 level. Thoracic spine MR would be necessary for further delineation. Multiple metastatic rib lesions most destructive involving the posterior left sixth rib. Findings suspicious for 4.6 x 6.1 cm liver lesion although incompletely assessed  on the present unenhanced CT chest. Mild enlargement of the right adrenal gland may represent hyperplasia or spread of tumor. Marked coronary artery calcifications. Heart size top-normal to slightly enlarged. 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 Electronically Signed   By: Genia Del M.D.   On: 08/12/2015 09:12   Ct Cervical Spine Wo Contrast  08/11/2015  CLINICAL DATA:  Unwitnessed fall with headaches and neck pain, initial encounter EXAM: CT HEAD WITHOUT CONTRAST CT CERVICAL SPINE WITHOUT CONTRAST TECHNIQUE: Multidetector CT imaging of the head and cervical spine was performed following the standard protocol without intravenous contrast. Multiplanar CT image reconstructions of the cervical spine were also generated. COMPARISON:  08/06/2015 FINDINGS: CT HEAD FINDINGS Bony calvarium is intact. Postsurgical changes are noted in the right orbit. Mild atrophic changes  are noted. Mild chronic white matter ischemic change is seen. No findings to suggest acute hemorrhage, acute infarction or space-occupying mass lesion are noted. CT CERVICAL SPINE FINDINGS Seven cervical segments are well visualized. Vertebral body height is well maintained. Osteophytic changes noted from C3-C7. Facet hypertrophic changes are noted. No acute fracture or acute facet abnormality is noted. Surrounding soft tissue structures show evidence of a left-sided pleural effusion of relatively large size. IMPRESSION: CT of the head:  No acute intracranial abnormality noted. CT of the cervical spine: Degenerative changes without acute bony abnormality. Large left pleural effusion. Electronically Signed   By: Inez Catalina M.D.   On: 08/11/2015 16:53   Ct Lumbar Spine Wo Contrast  08/11/2015  CLINICAL DATA:  Status post unwitnessed fall today. Lower extremity and low back pain. Initial encounter. EXAM: CT LUMBAR SPINE WITHOUT CONTRAST TECHNIQUE: Multidetector CT imaging of the lumbar spine was performed without intravenous  contrast administration. Multiplanar CT image reconstructions were also generated. COMPARISON:  None. FINDINGS: Vertebral body height and alignment are maintained. A sclerotic lesion in the L3 vertebral body to the right measures 2.8 cm AP by 2.1 cm transverse. Several punctate sclerotic lesions are also identified in the left ilium. Finally, a round sclerotic lesion is seen in the left side of the T11 vertebral body anterior to the pedicle measuring 1.3 cm AP by 1.1 cm transverse. Paraspinous structures demonstrate a small right and small to moderate left pleural effusion. Neither effusion is completely visualized. Bronchiectatic changes seen the right lower lobe. The patient has extensive retroperitoneal lymphadenopathy. Index paraaortic nodal mass measures approximately 3.4 x 2.9 cm on image 90 tripped. Inter-aortocaval node on image 90 measures approximately 2.1 cm short axis dimension. The patient has mild to moderate left hydronephrosis. Cause for obstruction is not discretely identified but the left ureter is not seen in its entirety. There appears to be bladder wall thickening eccentric to the left. Two small nonobstructing stones are identified the left kidney. There is scattered facet degenerative disease lumbar spine. Minimal disc bulge and endplate spurring are noted at L3-4, L4-5 and L5-S1. IMPRESSION: Negative for fracture. Findings consistent with metastatic neoplastic process with extensive abdominal and pelvic lymphadenopathy and sclerotic bone lesions identified. There is left hydronephrosis with partial visualization of a lesion in the left side of the urinary bladder. Most likely differential considerations are prostate or bladder carcinoma. These results were called by telephone at the time of interpretation on 08/11/2015 at 5:00 pm to Dr. Esaw Grandchild , who verbally acknowledged these results. Electronically Signed   By: Inge Rise M.D.   On: 08/11/2015 17:01   Dg Chest Port 1  View  08/11/2015  CLINICAL DATA:  Recent fall EXAM: PORTABLE CHEST - 1 VIEW COMPARISON:  08/06/2015 FINDINGS: Cardiac shadow is within normal limits. The lungs are well aerated bilaterally. No focal infiltrate or sizable effusion is noted. IMPRESSION: No acute abnormality noted. Electronically Signed   By: Inez Catalina M.D.   On: 08/11/2015 17:36   Dg Hips Bilat With Pelvis 2v  08/11/2015  CLINICAL DATA:  Status post fall. EXAM: DG HIP (WITH OR WITHOUT PELVIS) 2V BILAT COMPARISON:  None. FINDINGS: There is decreased bone mineralization. There is no evidence of hip fracture or dislocation. Mild to moderate bilateral hip joint osteoarthritic changes are seen, with joint space narrowing, subchondral sclerosis of the acetabula and osteophyte formation. IMPRESSION: No evidence of fracture or subluxation of bilateral hips. Electronically Signed   By: Linwood Dibbles.D.  On: 08/11/2015 17:39     CBC  Recent Labs Lab 08/06/15 1049 08/11/15 1716 08/12/15 0506  WBC 13.6* 15.4* 12.4*  HGB 11.1* 12.0* 10.9*  HCT 34.6* 35.9* 33.2*  PLT 366 398 357  MCV 89.4 86.3 87.4  MCH 28.7 28.8 28.7  MCHC 32.1 33.4 32.8  RDW 16.1* 16.2* 16.5*  LYMPHSABS 1.2 2.0  --   MONOABS 1.1* 1.5*  --   EOSABS 0.1 0.1  --   BASOSABS 0.0 0.0  --     Chemistries   Recent Labs Lab 08/06/15 1049 08/11/15 1716 08/12/15 0506  NA 142 142 142  K 4.1 4.9 4.0  CL 107 107 109  CO2 26 23 24   GLUCOSE 135* 89 82  BUN 24* 27* 26*  CREATININE 1.40* 1.84* 1.69*  CALCIUM 9.5 9.7 9.1  AST 25  --  29  ALT 8*  --  7*  ALKPHOS 261*  --  270*  BILITOT 0.8  --  0.5   ------------------------------------------------------------------------------------------------------------------ estimated creatinine clearance is 36.5 mL/min (by C-G formula based on Cr of 1.69). ------------------------------------------------------------------------------------------------------------------ No results for input(s): HGBA1C in the last 72  hours. ------------------------------------------------------------------------------------------------------------------ No results for input(s): CHOL, HDL, LDLCALC, TRIG, CHOLHDL, LDLDIRECT in the last 72 hours. ------------------------------------------------------------------------------------------------------------------ No results for input(s): TSH, T4TOTAL, T3FREE, THYROIDAB in the last 72 hours.  Invalid input(s): FREET3 ------------------------------------------------------------------------------------------------------------------ No results for input(s): VITAMINB12, FOLATE, FERRITIN, TIBC, IRON, RETICCTPCT in the last 72 hours.  Coagulation profile No results for input(s): INR, PROTIME in the last 168 hours.  No results for input(s): DDIMER in the last 72 hours.  Cardiac Enzymes  Recent Labs Lab 08/11/15 2254 08/12/15 0506 08/12/15 1115  TROPONINI 0.03 0.04* 0.04*   ------------------------------------------------------------------------------------------------------------------ Invalid input(s): POCBNP     Time Spent in minutes   30 minutes   Helmer Dull M.D on 08/12/2015 at 12:45 PM  Between 7am to 7pm - Pager - [phone]  After 7pm go to [email or web address] - password [email or web address]  [facility]   Office  [phone]

## 2015-08-12 NOTE — Progress Notes (Signed)
*  PRELIMINARY RESULTS* Vascular Ultrasound Lower extremity venous duplex has been completed.  Preliminary findings: No evidence of DVT or baker's cyst.  Landry Mellow, RDMS, RVT  08/12/2015, 12:42 PM

## 2015-08-12 NOTE — Consult Note (Addendum)
6:18 PM   Kent Mayo 11/12/1926 [id number]  Referring provider: Dr. Noelle Penner  Chief Complaint  Patient presents with  . Fall    HPI: The patient is a 82 year old gentleman with a past medical history that includes dementia, CKD stage III, recurrent UTI, and elevated PSA presented to the hospital after a fall. During workup for the fall he was discovered to have diffuse metastatic disease appearing to arise from his bladder or prostate. On CT scan, he has a mass arising from either his bladder or prostate causing left hydronephrosis as well as retroperitoneal adenopathy. He has multiple osseous metastases including in his ribs. MRI of the brain is pending. His PSA is 539.  The patient is not able to provide much history. He thinks he is in the hospital for his diabetes.     PMH: Past Medical History  Diagnosis Date  . Diabetes mellitus without complication ([place])   . Hypertension   . Hearing difficulty of both ears     Bilateral hearing aids  . Blindness     Severe visual impairment OD, Blind OS  . Abnormality of gait   . Peripheral edema   . Glaucoma   . Dyslipidemia   . Nontraumatic cerebral hemorrhage ([place])   . UTI (urinary tract infection)   . Generalized muscle weakness   . Renal disorder   . Dysphagia   . Cognitive communication deficit   . Encephalopathy   . Urinary retention   . Neoplasm of prostate   . Renal failure   . Hyperlipidemia     Surgical History: Past Surgical History  Procedure Laterality Date  . Eye surgery      Home Medications:    Medication List    ASK your doctor about these medications        acetaminophen 650 MG CR tablet  Commonly known as:  TYLENOL  Take 650 mg by mouth every 8 (eight) hours.     atorvastatin 10 MG tablet  Commonly known as:  LIPITOR  Take 10 mg by mouth at bedtime.     brimonidine 0.2 % ophthalmic solution  Commonly known as:  ALPHAGAN  Place 1 drop into both eyes 3 (three) times daily.     carvedilol 6.25 MG tablet  Commonly known as:  COREG  Take 6.25 mg by mouth 2 (two) times daily. With food.     donepezil 10 MG tablet  Commonly known as:  ARICEPT  Take 10 mg by mouth 2 (two) times daily.     dorzolamide-timolol 22.3-6.8 MG/ML ophthalmic solution  Commonly known as:  COSOPT  Place 1 drop into the right eye 2 (two) times daily.     finasteride 5 MG tablet  Commonly known as:  PROSCAR  Take 5 mg by mouth daily.     guaiFENesin 600 MG 12 hr tablet  Commonly known as:  MUCINEX  Take 600 mg by mouth 2 (two) times daily as needed for to loosen phlegm.     LORazepam 0.5 MG tablet  Commonly known as:  ATIVAN  Take 0.5 mg by mouth at bedtime as needed (insomnia).     megestrol 400 MG/10ML suspension  Commonly known as:  MEGACE  Take 10 mLs (400 mg total) by mouth 2 (two) times daily.     memantine 10 MG tablet  Commonly known as:  NAMENDA  Take 10 mg by mouth 2 (two) times daily.     mirtazapine 15 MG tablet  Commonly known as:  REMERON  Take 15 mg by mouth at bedtime.     NUTRITIONAL SUPPLEMENT Liqd  Take 1 Can by mouth 3 (three) times daily. Between meals. "Med Pass."     senna-docusate 8.6-50 MG tablet  Commonly known as:  Senokot-S  Take 1 tablet by mouth at bedtime as needed for mild constipation.     traMADol 50 MG tablet  Commonly known as:  ULTRAM  Take 50 mg by mouth 2 (two) times daily.     verapamil 240 MG CR tablet  Commonly known as:  CALAN-SR  Take 240 mg by mouth daily.        Allergies: No Known Allergies  Family History: Family History  Problem Relation Age of Onset  . Hypertension Mother   . Hypertension Father   . Diabetes Mellitus II Brother     Social History:  reports that he has quit smoking. He has never used smokeless tobacco. He reports that he does not drink alcohol or use illicit drugs.  ROS: unable to obtain                                        Physical Exam: BP 140/86 mmHg  Pulse 73   Temp(Src) 98.8 F (37.1 C) (Oral)  Resp 15  Ht 6\' 2"  (1.88 m)  Wt 181 lb 8 oz (82.328 kg)  BMI 23.29 kg/m2  SpO2 98%  Constitutional:  Alert and oriented, No acute distress. HEENT: [place] AT, moist mucus membranes.  Trachea midline, no masses. Cardiovascular: No clubbing, cyanosis, or edema. Respiratory: Normal respiratory effort, no increased work of breathing. GI: Abdomen is soft, nontender, nondistended, no abdominal masses GU: No CVA tenderness. normal phallus. Testicles descended bilaterally. DRE: Firm bilaterally. Right greater than left. Small nodule noted on the right. Skin: No rashes, bruises or suspicious lesions. Lymph: No cervical or inguinal adenopathy. Neurologic: Grossly intact, no focal deficits, moving all 4 extremities. Psychiatric: Normal mood and affect.  Laboratory Data: Lab Results  Component Value Date   WBC 12.4* 08/12/2015   HGB 10.9* 08/12/2015   HCT 33.2* 08/12/2015   MCV 87.4 08/12/2015   PLT 357 08/12/2015    Lab Results  Component Value Date   CREATININE 1.69* 08/12/2015    Lab Results  Component Value Date   PSA 539.00* 08/12/2015    No results found for: TESTOSTERONE  Lab Results  Component Value Date   HGBA1C 5.1 03/20/2015    Urinalysis    Component Value Date/Time   COLORURINE AMBER* 08/11/2015 2242   APPEARANCEUR CLOUDY* 08/11/2015 2242   LABSPEC 1.023 08/11/2015 2242   PHURINE 5.5 08/11/2015 2242   GLUCOSEU NEGATIVE 08/11/2015 2242   HGBUR LARGE* 08/11/2015 2242   BILIRUBINUR SMALL* 08/11/2015 2242   KETONESUR 15* 08/11/2015 2242   PROTEINUR 30* 08/11/2015 2242   UROBILINOGEN 0.2 03/29/2015 1613   NITRITE NEGATIVE 08/11/2015 2242   LEUKOCYTESUR MODERATE* 08/11/2015 2242    Pertinent Imaging:    Study Result     CLINICAL DATA: Unwitnessed fall  EXAM: CT ABDOMEN AND PELVIS WITHOUT CONTRAST  TECHNIQUE: Multidetector CT imaging of the abdomen and pelvis was performed following the standard protocol without IV  contrast.  COMPARISON: 08/11/2015  FINDINGS: Lower chest: There is an at least moderate partially visualized left pleural effusion. There is a small right pleural effusion. Bilaterally there is underlying atelectasis.  Hepatobiliary: Mild sludge layering dependently in the gallbladder. Gallbladder  is mildly distended with no evidence of inflammation surrounding it. Liver is normal.  Pancreas: Normal  Spleen: Normal  Adrenals/Urinary Tract: There is moderate to severe dilatation of the left ureter and moderate left hydronephrosis. Several tiny nonobstructing left renal calculi are noted. There is evidence of a large lobulated soft tissue mass involving the mid to inferior left aspect of the bladder measuring about 7 by 7 x 5.5 cm.  Stomach/Bowel: Nonobstructive bowel gas pattern. Diverticulosis of the sigmoid colon. Mild to moderate fecal impaction in the rectum which is distended to a diameter of 7.5 cm.  Vascular/Lymphatic: Atherosclerotic aortoiliac calcification. Soft tissue fullness in the retroperitoneum in both the. Aortic and aortocaval region. This suggests adenopathy as described on report of CT scan performed 08/11/2015. There is bulky left pelvic sidewall adenopathy.  Reproductive: The mass involving the left inferior bladder could arise from either the prostate or the bladder.  Musculoskeletal: Numerous sclerotic lesions throughout the thoracolumbar spine and sacrum. Sclerotic lesions are also seen in the iliac bones and sacrum.  IMPRESSION: Evidence of carcinoma arising either from the prostate or the bladder, but involving both organs and causing left hydronephrosis.  Evidence of metastatic adenopathy in the retroperitoneum and in the pelvis.  Bilateral pleural effusions.    CLINICAL DATA: 82 year old male with failure to thrive. Prostate cancer. Pleural effusion.  EXAM: CT CHEST WITHOUT CONTRAST  TECHNIQUE: Multidetector CT  imaging of the chest was performed following the standard protocol without IV contrast.  COMPARISON: 08/11/2015 and 07/03/2025 chest x-ray  FINDINGS: Moderate-size left-sided pleural effusion. Consolidation left lower lobe. No obstructing lesion of the bronchi of the left lower lobe and therefore this may represent passive atelectasis or infiltrate.  Small right sided pleural effusion.  Anterior right upper lobe and right middle lobe tiny nodules with suggestion of small calcifications may represent small granulomas although metastatic disease not excluded. Noncalcified left upper lobe 3 mm nodule of indeterminate etiology, metastatic disease not excluded.  Mediastinal adenopathy. Index lymph nodes pretracheal region with transverse dimension of 2.6 x 1.9 cm and 2.7 x 2.4 cm. Preaortic adenopathy measures 2.3 x 1.9 cm. Retrocrural adenopathy measures 1.8 x 1.7 cm.  Diffuse osseous metastatic disease. Sclerotic metastatic foci seen throughout the lower cervical spine and throughout the thoracic spine upper lumbar spine. At the T9 level, circumferential soft tissue component tumor with possibly of epidural spread on the right. There may be epidural spread of tumor on the right at the T8 level and T6 level. Thoracic spine MR would be necessary for further delineation.  Multiple metastatic rib lesions most destructive involving the posterior left sixth rib.  Findings suspicious for 4.6 x 6.1 cm liver lesion although incompletely assessed on the present unenhanced CT chest.  Mild enlargement of the right adrenal gland may represent hyperplasia or spread of tumor.  Marked coronary artery calcifications. Heart size top-normal to slightly enlarged.  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  IMPRESSION: Moderate-size left-sided pleural effusion. Consolidation left lower lobe. No obstructing lesion of the bronchi of the left lower lobe and therefore  this may represent passive atelectasis or infiltrate.  Small right sided pleural effusion.  Anterior right upper lobe and right middle lobe tiny nodules with suggestion of small calcifications may represent small granulomas although metastatic disease not excluded. Noncalcified left upper lobe 3 mm nodule of indeterminate etiology, metastatic disease not excluded.  Mediastinal adenopathy. Retrocrural adenopathy.  Diffuse osseous metastatic disease. Sclerotic metastatic foci seen throughout the lower cervical spine and throughout  the thoracic spine upper lumbar spine. At the T9 level, circumferential soft tissue component tumor with possibly of epidural spread on the right. There may be epidural spread of tumor on the right at the T8 level and T6 level. Thoracic spine MR would be necessary for further delineation.  Multiple metastatic rib lesions most destructive involving the posterior left sixth rib.  Findings suspicious for 4.6 x 6.1 cm liver lesion although incompletely assessed on the present unenhanced CT chest.  Mild enlargement of the right adrenal gland may represent hyperplasia or spread of tumor.  Marked coronary artery calcifications. Heart size top-normal to slightly enlarged.  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     Assessment & Plan:    1. Presumed metastatic prostate cancer with diffuse adenopathy and osseous metastases -Will order Firmagon 240 mg subcutaneous 1 dose  (typically dosed q4weeks) [androgen deprivation therapy] -Will check a baseline testosterone level prior to giving firmagon  2. Left hydroureteronephrosis -Secondary to above. His creatinine is returning to baseline. In the event the patient needs left renal drainage, he would need a left percutaneous nephrostomy tube as the prostate cancer invading his bladder would make placing a retrograde stent difficult or even impossible.  3.  Recurrent UTI -Continue  antibiotic starting culture and sensitivity results -Continue Foley for now   Nickie Retort, MD

## 2015-08-12 NOTE — Progress Notes (Signed)
Initial Nutrition Assessment  DOCUMENTATION CODES:   Severe malnutrition in context of chronic illness  INTERVENTION:    Ensure Enlive PO BID, each supplement provides 350 kcal and 20 grams of protein  NUTRITION DIAGNOSIS:   Malnutrition related to chronic illness as evidenced by severe depletion of muscle mass, percent weight loss (20% weight loss within the past 6 months).  GOAL:   Patient will meet greater than or equal to 90% of their needs  MONITOR:   PO intake, Labs, Weight trends  REASON FOR ASSESSMENT:   Consult Assessment of nutrition requirement/status  ASSESSMENT:   82 y.o. male with history of dementia, hypertension, chronic anemia, chronic kidney disease and legally blind , brought to the ER for fall at his nursing home, as well failure to thrive, workup was significant for bladder/prostate mass, with left-sided hydronephrosis ,with what appears to be metastatic disease with bone involvement, liver and lung.  Patient did not wake up during RD visit, no family in room. Nutrition-Focused physical exam completed. Findings are mild-moderate fat depletion and mild-moderate and severe muscle depletion. S/P bedside swallow evaluation with SLP this AM, recommends full liquid diet, thin liquids, only when patient is alert. Patient with severe PCM.   Diet Order:   NPO  Skin:  Reviewed, no issues  Last BM:  1/8  Height:   Ht Readings from Last 1 Encounters:  08/11/15 6\' 2"  (1.88 m)    Weight:   Wt Readings from Last 1 Encounters:  08/12/15 181 lb 8 oz (82.328 kg)    Ideal Body Weight:  86.4 kg  BMI:  Body mass index is 23.29 kg/(m^2).  Estimated Nutritional Needs:   Kcal:  2200-2400  Protein:  110-130 gm  Fluid:  2.2-2.4 L  EDUCATION NEEDS:   No education needs identified at this time   Molli Barrows, [place], [place], [place] Pager [phone] After Hours Pager [phone]

## 2015-08-12 NOTE — Evaluation (Signed)
Clinical/Bedside Swallow Evaluation Patient Details  Name: Kent Mayo MRN: [id number] Date of Birth: Feb 11, 1927  Today's Date: 08/12/2015 Time: SLP Start Time (ACUTE ONLY): 1008 SLP Stop Time (ACUTE ONLY): 1035 SLP Time Calculation (min) (ACUTE ONLY): 27 min  Past Medical History:  Past Medical History  Diagnosis Date  . Diabetes mellitus without complication ([place])   . Hypertension   . Hearing difficulty of both ears     Bilateral hearing aids  . Blindness     Severe visual impairment OD, Blind OS  . Abnormality of gait   . Peripheral edema   . Glaucoma   . Dyslipidemia   . Nontraumatic cerebral hemorrhage ([place])   . UTI (urinary tract infection)   . Generalized muscle weakness   . Renal disorder   . Dysphagia   . Cognitive communication deficit   . Encephalopathy   . Urinary retention   . Neoplasm of prostate   . Renal failure   . Hyperlipidemia    Past Surgical History:  Past Surgical History  Procedure Laterality Date  . Eye surgery     HPI:  81 yo male adm to [facility] from ALF after fall.  PMH + for dementia, hearing loss, blindness, renal fx, UTI, cognitive communication deficit, poor intake over one month - pt on Megace.  Pt found to have bilateral pleural effusions, ? left atx vs infiltrate, diffuse osteous metastatic disease, mediastinal adenopathy per CT scan.  Anterior spurring noted C4-C5, C6-C6, C6-C7.  Bedside swallow eval ordered as pt failed RNSSS due to decreased ability to follow commands.   CT head negative for acute change, uncertrain if pt was able to have MRI.     Assessment / Plan / Recommendation Clinical Impression  Limited assessment completed due to pt's agitation and lack of desire for po.  Moist and clean oral cavity noted which is good indicator for his swallowing and secretion management.  Phonation is strong and clear when pt speaks indicating strong laryngeal strength.  Pt did not follow directions for OME but did accept a single small bolus  of icecream.  Pt with mild delay in oral transiting but no indications of aspiration nor oropharyngeal residuals.  Anticipate intact pharyngeal swallow.  Pt refused all further intake stating "I just want to be quiet".    Recommend pt be allowed full liquid diet ONLY when fully alert, accepting and tolerating.  Rec make pt NPO if showing difficulties.  Medicine recommend give with applesauce or icecream.  Note intake has been poor even prior to admission - per chart review.    Palliative referral pending per SlP conversation with NP for palliative team.    Anticipate swallow to improve consistent with mentation.  Will follow up for tolerance, dietary advancement readiness and family education.      Aspiration Risk  Moderate aspiration risk    Diet Recommendation Thin liquids - full liquids  Liquid Administration via: Cup Medication Administration: Whole meds with puree Supervision: Full supervision/cueing for compensatory strategies Postural Changes: Seated upright at 90 degrees    Other  Recommendations Oral Care Recommendations: Oral care before and after PO   Follow up Recommendations    TBD   Frequency and Duration min 1 x/week  1 week       Prognosis Prognosis for Safe Diet Advancement: Fair Barriers to Reach Goals:  (poor intake prior to admit)      Swallow Study   General Date of Onset: 08/12/15 HPI: 82 yo male adm to [facility]  from ALF after fall.  PMH + for dementia, hearing loss, blindness, renal fx, UTI, cognitive communication deficit, poor intake over one month - pt on Megace.  Pt found to have bilateral pleural effusions, ? left atx vs infiltrate, diffuse osteous metastatic disease, mediastinal adenopathy per CT scan.  Anterior spurring noted C4-C5, C6-C6, C6-C7.  Bedside swallow eval ordered as pt failed RNSSS due to decreased ability to follow commands.   CT head negative for acute change, uncertrain if pt was able to have MRI.   Type of Study: Bedside Swallow  Evaluation Diet Prior to this Study: NPO Temperature Spikes Noted: Yes (low grade) Respiratory Status: Room air History of Recent Intubation: No Behavior/Cognition: Confused;Agitated;Lethargic/Drowsy;Doesn't follow directions Oral Cavity Assessment: Other (comment) (oral cavity was moist and clean) Oral Care Completed by SLP: No (pt agitated and did not allow oral care) Oral Cavity - Dentition: Adequate natural dentition Vision: Impaired for self-feeding Self-Feeding Abilities: Total assist Patient Positioning: Upright in bed Baseline Vocal Quality:  (when verbalize voice is strong and clear) Volitional Cough: Cognitively unable to elicit Volitional Swallow: Unable to elicit    Oral/Motor/Sensory Function Overall Oral Motor/Sensory Function:  (no focal CN deficits from limited evaluation)   Ice Chips Ice chips: Not tested Other Comments: pt refused to accept   Thin Liquid Thin Liquid: Not tested Other Comments: pt refused to accept    Nectar Thick Nectar Thick Liquid: Not tested Other Comments: pt refused to accept   Honey Thick Honey Thick Liquid: Not tested   Puree Puree: Impaired Presentation: Spoon Oral Phase Impairments: Reduced lingual movement/coordination Oral Phase Functional Implications: Prolonged oral transit   Solid   GO Functional Assessment Tool Used: clinical judgement Functional Limitations: Swallowing Swallow Current Status [id number]): At least 40 percent but less than 60 percent impaired, limited or restricted Swallow Goal Status [phone]): At least 20 percent but less than 40 percent impaired, limited or restricted  Solid: Not tested Other Comments: pt refused to accept       Luanna Salk, [place] [facility] SLP [phone]

## 2015-08-12 NOTE — Care Management Obs Status (Signed)
[place] NOTIFICATION   Patient Details  Name: WALES LARRISON MRN: [id number] Date of Birth: June 23, 1927   Medicare Observation Status Notification Given:  Yes    Bethena Roys, RN 08/12/2015, 11:18 AM

## 2015-08-13 ENCOUNTER — Inpatient Hospital Stay (HOSPITAL_COMMUNITY): Payer: Medicare Other

## 2015-08-13 DIAGNOSIS — F0390 Unspecified dementia without behavioral disturbance: Secondary | ICD-10-CM | POA: Diagnosis present

## 2015-08-13 DIAGNOSIS — C801 Malignant (primary) neoplasm, unspecified: Secondary | ICD-10-CM | POA: Insufficient documentation

## 2015-08-13 DIAGNOSIS — C61 Malignant neoplasm of prostate: Secondary | ICD-10-CM | POA: Diagnosis present

## 2015-08-13 DIAGNOSIS — R630 Anorexia: Secondary | ICD-10-CM | POA: Diagnosis present

## 2015-08-13 DIAGNOSIS — Z515 Encounter for palliative care: Secondary | ICD-10-CM

## 2015-08-13 LAB — BASIC METABOLIC PANEL
ANION GAP: 12 (ref 5–15)
BUN: 24 mg/dL — AB (ref 6–20)
CALCIUM: 9.2 mg/dL (ref 8.9–10.3)
CO2: 23 mmol/L (ref 22–32)
CREATININE: 1.55 mg/dL — AB (ref 0.61–1.24)
Chloride: 108 mmol/L (ref 101–111)
GFR calc Af Amer: 45 mL/min — ABNORMAL LOW (ref >60)
GFR, EST NON AFRICAN AMERICAN: 39 mL/min — AB (ref >60)
GLUCOSE: 91 mg/dL (ref 65–99)
Potassium: 3.8 mmol/L (ref 3.5–5.1)
Sodium: 143 mmol/L (ref 135–145)

## 2015-08-13 LAB — CBC
HEMATOCRIT: 36.1 % — AB (ref 39.0–52.0)
Hemoglobin: 11.8 g/dL — ABNORMAL LOW (ref 13.0–17.0)
MCH: 29.1 pg (ref 26.0–34.0)
MCHC: 32.7 g/dL (ref 30.0–36.0)
MCV: 88.9 fL (ref 78.0–100.0)
PLATELETS: 316 10*3/uL (ref 150–400)
RBC: 4.06 MIL/uL — ABNORMAL LOW (ref 4.22–5.81)
RDW: 17.1 % — AB (ref 11.5–15.5)
WBC: 9.7 10*3/uL (ref 4.0–10.5)

## 2015-08-13 LAB — AMMONIA: Ammonia: 31 umol/L (ref 9–35)

## 2015-08-13 LAB — GLUCOSE, CAPILLARY
GLUCOSE-CAPILLARY: 87 mg/dL (ref 65–99)
GLUCOSE-CAPILLARY: 91 mg/dL (ref 65–99)
GLUCOSE-CAPILLARY: 119 mg/dL — AB (ref 65–99)

## 2015-08-13 LAB — URINE CULTURE

## 2015-08-13 LAB — TESTOSTERONE: Testosterone: 104 ng/dL — ABNORMAL LOW (ref 348–1197)

## 2015-08-13 LAB — HEMOGLOBIN A1C
Hgb A1c MFr Bld: 5.2 % (ref 4.8–5.6)
Mean Plasma Glucose: 103 mg/dL

## 2015-08-13 NOTE — Progress Notes (Signed)
Patient Demographics  Kent Mayo, is a 82 y.o. male, DOB - January 12, 1927, [id number]  Admit date - 08/11/2015   Admitting Physician Rise Patience, MD  Outpatient Primary MD for the patient is No primary care provider on file.  LOS - 2   Chief Complaint  Patient presents with  . Fall       Admission HPI/Brief narrative: 82 y.o. male with history of dementia, hypertension, chronic anemia, chronic kidney disease and legally blind , brought to the ER for  fall at his nursing home, as well failure to thrive, workup was significant for bladder/prostate mass, with left-sided hydronephrosis ,with what appears to be metastatic disease with bone involvement, liver and lung.  Subjective:   Kent Mayo today is confused, denies any chest pain, shortness of breath or pain.   Assessment & Plan    Principal Problem:   Fall at nursing home Active Problems:   HTN (hypertension)   FTT (failure to thrive) in adult   Syncope and collapse   Renal failure (ARF), acute on chronic ([place])   Fall   Palliative care encounter   Prostate cancer metastatic to multiple sites [facility])   Anorexia   Dementia  Fall - Most likely related to dehydration, failure to thrive, UTI, had metastatic cancer. - Seen by PT, they recommended SNF.  Failure to thrive/ severe protein calorie malnutritionIn context of cancer and chronic illness - Patient appears to be malnourished, this is most likely related to his metastatic cancer, with known baseline dementia, will consult nutrition, continue with ensure. - Patient with with poor oral intake on admission, with low CBG , requiring D5 half-normal saline , currently improved oral intake , will discontinue D5 half-normal and monitor for hypoglycemia .  UTI - Continue with Rocephine -  urine culture with multiple species   metastatic prostate cancer  - Imaging obtained  showing evidence of prostate/ bladder cancer, with metastasis involving lung, liver, adrenal glands, and bones. - Can to obtain MRI brain secondary to foreign object. - Dr. Jeffie Pollock consult appreciated, PSA> 500, Apparent T4 N1 M1 prostate cancer with left ureteral obstruction and baseline hypogonadism, Management per Urology - No indication for stent or percutaneous for left hydro-given stable renal function    Dementia - Continue with home medication  Hypertension - Blood pressure acceptable, continue with home medication.  chronic renal disease stage III - Baseline creatinine 1.6, today is 1.6, on admission 1.back to baseline  Code Status: Full  Family Communication: Spoke with brother 08/11/2014    Disposition Plan:ALF VS SNF.   Procedures  none   Consults   Palliative medicine Urology , Dr Jeffie Pollock.   Medications  Scheduled Meds: . atorvastatin  10 mg Oral QHS  . brimonidine  1 drop Both Eyes TID  . carvedilol  6.25 mg Oral BID WC  . cefTRIAXone (ROCEPHIN)  IV  1 g Intravenous Q24H  . degarelix  240 mg Subcutaneous Once  . donepezil  10 mg Oral BID  . dorzolamide-timolol  1 drop Right Eye BID  . enoxaparin (LOVENOX) injection  40 mg Subcutaneous Daily  . feeding supplement (ENSURE ENLIVE)  237 mL Oral TID BM  . megestrol  400 mg Oral BID  . memantine  10 mg  Oral BID  . mirtazapine  15 mg Oral QHS  . traMADol  50 mg Oral BID  . verapamil  240 mg Oral Daily   Continuous Infusions: . dextrose 5 % and 0.45% NaCl 50 mL/hr at 08/13/15 1300   PRN Meds:.guaiFENesin, LORazepam, senna-docusate  DVT Prophylaxis  Lovenox   Lab Results  Component Value Date   PLT 316 08/13/2015    Antibiotics    Anti-infectives    Start     Dose/Rate Route Frequency Ordered Stop   08/12/15 0115  cefTRIAXone (ROCEPHIN) 1 g in dextrose 5 % 50 mL IVPB     1 g 100 mL/hr over 30 Minutes Intravenous Every 24 hours 08/12/15 0113            Objective:   Filed Vitals:   08/12/15  1556 08/12/15 2035 08/13/15 0500 08/13/15 1440  BP: 140/86 152/71 159/73 142/72  Pulse: 73 72 65 69  Temp: 98.8 F (37.1 C) 97.5 F (36.4 C) 98 F (36.7 C) 98 F (36.7 C)  TempSrc: Oral Axillary Oral Oral  Resp: 15 16 16 16   Height:      Weight:   82.781 kg (182 lb 8 oz)   SpO2: 98% 100% 100% 100%    Wt Readings from Last 3 Encounters:  08/13/15 82.781 kg (182 lb 8 oz)  04/01/15 103.4 kg (227 lb 15.3 oz)  03/22/15 103.9 kg (229 lb 0.9 oz)     Intake/Output Summary (Last 24 hours) at 08/13/15 1550 Last data filed at 08/13/15 1230  Gross per 24 hour  Intake    950 ml  Output    650 ml  Net    300 ml     Physical Exam  Awake, confused,  but much more coherent and communicative today. legally blind. Supple Neck,No JVD, Symmetrical Chest wall movement, Good air movement bilaterally,  RRR,No Gallops,Rubs or new Murmurs, No Parasternal Heave +ve B.Sounds, Abd Soft, No tenderness,  No rebound - guarding or rigidity. No Cyanosis, Clubbing or edema, No new Rash or bruise     Data Review   Micro Results Recent Results (from the past 240 hour(s))  Urine culture     Status: None   Collection Time: 08/11/15 10:41 PM  Result Value Ref Range Status   Specimen Description URINE, CLEAN CATCH  Final   Special Requests NONE  Final   Culture MULTIPLE SPECIES PRESENT, SUGGEST RECOLLECTION  Final   Report Status 08/13/2015 FINAL  Final    Radiology Reports Ct Abdomen Pelvis Wo Contrast  08/11/2015  CLINICAL DATA:  Unwitnessed fall EXAM: CT ABDOMEN AND PELVIS WITHOUT CONTRAST TECHNIQUE: Multidetector CT imaging of the abdomen and pelvis was performed following the standard protocol without IV contrast. COMPARISON:  08/11/2015 FINDINGS: Lower chest: There is an at least moderate partially visualized left pleural effusion. There is a small right pleural effusion. Bilaterally there is underlying atelectasis. Hepatobiliary: Mild sludge layering dependently in the gallbladder. Gallbladder  is mildly distended with no evidence of inflammation surrounding it. Liver is normal. Pancreas: Normal Spleen: Normal Adrenals/Urinary Tract: There is moderate to severe dilatation of the left ureter and moderate left hydronephrosis. Several tiny nonobstructing left renal calculi are noted. There is evidence of a large lobulated soft tissue mass involving the mid to inferior left aspect of the bladder measuring about 7 by 7 x 5.5 cm. Stomach/Bowel: Nonobstructive bowel gas pattern. Diverticulosis of the sigmoid colon. Mild to moderate fecal impaction in the rectum which is distended to a diameter of  7.5 cm. Vascular/Lymphatic: Atherosclerotic aortoiliac calcification. Soft tissue fullness in the retroperitoneum in both the. Aortic and aortocaval region. This suggests adenopathy as described on report of CT scan performed 08/11/2015. There is bulky left pelvic sidewall adenopathy. Reproductive: The mass involving the left inferior bladder could arise from either the prostate or the bladder. Musculoskeletal: Numerous sclerotic lesions throughout the thoracolumbar spine and sacrum. Sclerotic lesions are also seen in the iliac bones and sacrum. IMPRESSION: Evidence of carcinoma arising either from the prostate or the bladder, but involving both organs and causing left hydronephrosis. Evidence of metastatic adenopathy in the retroperitoneum and in the pelvis. Bilateral pleural effusions. Electronically Signed   By: Skipper Cliche M.D.   On: 08/11/2015 20:19   Dg Eye Foreign Body  08/13/2015  CLINICAL DATA:  Metal working/exposure; clearance prior to MRI EXAM: ORBITS FOR FOREIGN BODY - 2 VIEW COMPARISON:  CT 08/11/2015 FINDINGS: There is a and radiopaque implants noted within the right orbit, seen lateral to the right globe on recent CT. Small density projects over the left orbit on the first image. On prior CT, there is a metallic foreign body noted along the anterior left globe. IMPRESSION: Radiodense implant within  the right orbit. Small metallic foreign body along and possibly within the anterior globe on the left. Electronically Signed   By: Rolm Baptise M.D.   On: 08/13/2015 11:44   Dg Chest 2 View  08/06/2015  CLINICAL DATA:  Productive cough for 3 days with clear thick sputum. EXAM: CHEST  2 VIEW COMPARISON:  07/04/2015. FINDINGS: Telemetry leads overlie the chest. Normal cardiomediastinal silhouette. Bibasilar subsegmental atelectasis is suspected. No active infiltrates or failure can be observed. There is degenerative change both shoulders. IMPRESSION: Bibasilar subsegmental atelectasis is suspected. No active infiltrates or failure. Worsening aeration from priors. Electronically Signed   By: Staci Righter M.D.   On: 08/06/2015 11:21   Dg Knee 1-2 Views Left  08/12/2015  CLINICAL DATA:  Bilateral knee pain. EXAM: LEFT KNEE - 1-2 VIEW; RIGHT KNEE - 1-2 VIEW COMPARISON:  Left knee radiographs 08/11/2015 FINDINGS: Moderate tricompartmental degenerative changes bilaterally. No acute bony findings or osteochondral abnormality. No definite joint effusion. IMPRESSION: Moderate degenerative changes but no acute bony findings or joint effusion. Electronically Signed   By: Marijo Sanes M.D.   On: 08/12/2015 08:09   Dg Knee 1-2 Views Right  08/12/2015  CLINICAL DATA:  Bilateral knee pain. EXAM: LEFT KNEE - 1-2 VIEW; RIGHT KNEE - 1-2 VIEW COMPARISON:  Left knee radiographs 08/11/2015 FINDINGS: Moderate tricompartmental degenerative changes bilaterally. No acute bony findings or osteochondral abnormality. No definite joint effusion. IMPRESSION: Moderate degenerative changes but no acute bony findings or joint effusion. Electronically Signed   By: Marijo Sanes M.D.   On: 08/12/2015 08:09   Dg Tibia/fibula Left  08/11/2015  CLINICAL DATA:  Recent fall, initial encounter EXAM: LEFT TIBIA AND FIBULA - 2 VIEW COMPARISON:  None. FINDINGS: No acute fracture or dislocation is noted. There is some bony bridging between the distal  tibia and distal fibula. No joint effusion is seen no soft tissue abnormalities are noted. IMPRESSION: No acute abnormality seen. Electronically Signed   By: Inez Catalina M.D.   On: 08/11/2015 17:34   Ct Head Wo Contrast  08/11/2015  CLINICAL DATA:  Unwitnessed fall with headaches and neck pain, initial encounter EXAM: CT HEAD WITHOUT CONTRAST CT CERVICAL SPINE WITHOUT CONTRAST TECHNIQUE: Multidetector CT imaging of the head and cervical spine was performed following the standard protocol without  intravenous contrast. Multiplanar CT image reconstructions of the cervical spine were also generated. COMPARISON:  08/06/2015 FINDINGS: CT HEAD FINDINGS Bony calvarium is intact. Postsurgical changes are noted in the right orbit. Mild atrophic changes are noted. Mild chronic white matter ischemic change is seen. No findings to suggest acute hemorrhage, acute infarction or space-occupying mass lesion are noted. CT CERVICAL SPINE FINDINGS Seven cervical segments are well visualized. Vertebral body height is well maintained. Osteophytic changes noted from C3-C7. Facet hypertrophic changes are noted. No acute fracture or acute facet abnormality is noted. Surrounding soft tissue structures show evidence of a left-sided pleural effusion of relatively large size. IMPRESSION: CT of the head:  No acute intracranial abnormality noted. CT of the cervical spine: Degenerative changes without acute bony abnormality. Large left pleural effusion. Electronically Signed   By: Inez Catalina M.D.   On: 08/11/2015 16:53   Ct Head Wo Contrast  08/06/2015  CLINICAL DATA:  Altered mental status today.  Hypertension, UTI EXAM: CT HEAD WITHOUT CONTRAST TECHNIQUE: Contiguous axial images were obtained from the base of the skull through the vertex without intravenous contrast. COMPARISON:  07/04/2015 FINDINGS: There is atrophy and chronic small vessel disease changes. No acute intracranial abnormality. Specifically, no hemorrhage, hydrocephalus,  mass lesion, acute infarction, or significant intracranial injury. No acute calvarial abnormality. IMPRESSION: No acute intracranial abnormality. Atrophy, chronic microvascular disease. Electronically Signed   By: Rolm Baptise M.D.   On: 08/06/2015 11:11   Ct Chest Wo Contrast  08/12/2015  CLINICAL DATA:  82 year old male with failure to thrive. Prostate cancer. Pleural effusion. EXAM: CT CHEST WITHOUT CONTRAST TECHNIQUE: Multidetector CT imaging of the chest was performed following the standard protocol without IV contrast. COMPARISON:  08/11/2015 and 07/03/2025 chest x-ray FINDINGS: Moderate-size left-sided pleural effusion. Consolidation left lower lobe. No obstructing lesion of the bronchi of the left lower lobe and therefore this may represent passive atelectasis or infiltrate. Small right sided pleural effusion. Anterior right upper lobe and right middle lobe tiny nodules with suggestion of small calcifications may represent small granulomas although metastatic disease not excluded. Noncalcified left upper lobe 3 mm nodule of indeterminate etiology, metastatic disease not excluded. Mediastinal adenopathy. Index lymph nodes pretracheal region with transverse dimension of 2.6 x 1.9 cm and 2.7 x 2.4 cm. Preaortic adenopathy measures 2.3 x 1.9 cm. Retrocrural adenopathy measures 1.8 x 1.7 cm. Diffuse osseous metastatic disease. Sclerotic metastatic foci seen throughout the lower cervical spine and throughout the thoracic spine upper lumbar spine. At the T9 level, circumferential soft tissue component tumor with possibly of epidural spread on the right. There may be epidural spread of tumor on the right at the T8 level and T6 level. Thoracic spine MR would be necessary for further delineation. Multiple metastatic rib lesions most destructive involving the posterior left sixth rib. Findings suspicious for 4.6 x 6.1 cm liver lesion although incompletely assessed on the present unenhanced CT chest. Mild enlargement  of the right adrenal gland may represent hyperplasia or spread of tumor. Marked coronary artery calcifications. Heart size top-normal to slightly enlarged. 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 IMPRESSION: Moderate-size left-sided pleural effusion. Consolidation left lower lobe. No obstructing lesion of the bronchi of the left lower lobe and therefore this may represent passive atelectasis or infiltrate. Small right sided pleural effusion. Anterior right upper lobe and right middle lobe tiny nodules with suggestion of small calcifications may represent small granulomas although metastatic disease not excluded. Noncalcified left upper lobe 3 mm nodule of indeterminate etiology,  metastatic disease not excluded. Mediastinal adenopathy.  Retrocrural adenopathy. Diffuse osseous metastatic disease. Sclerotic metastatic foci seen throughout the lower cervical spine and throughout the thoracic spine upper lumbar spine. At the T9 level, circumferential soft tissue component tumor with possibly of epidural spread on the right. There may be epidural spread of tumor on the right at the T8 level and T6 level. Thoracic spine MR would be necessary for further delineation. Multiple metastatic rib lesions most destructive involving the posterior left sixth rib. Findings suspicious for 4.6 x 6.1 cm liver lesion although incompletely assessed on the present unenhanced CT chest. Mild enlargement of the right adrenal gland may represent hyperplasia or spread of tumor. Marked coronary artery calcifications. Heart size top-normal to slightly enlarged. 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 Electronically Signed   By: Genia Del M.D.   On: 08/12/2015 09:12   Ct Cervical Spine Wo Contrast  08/11/2015  CLINICAL DATA:  Unwitnessed fall with headaches and neck pain, initial encounter EXAM: CT HEAD WITHOUT CONTRAST CT CERVICAL SPINE WITHOUT CONTRAST TECHNIQUE: Multidetector CT imaging of the head and  cervical spine was performed following the standard protocol without intravenous contrast. Multiplanar CT image reconstructions of the cervical spine were also generated. COMPARISON:  08/06/2015 FINDINGS: CT HEAD FINDINGS Bony calvarium is intact. Postsurgical changes are noted in the right orbit. Mild atrophic changes are noted. Mild chronic white matter ischemic change is seen. No findings to suggest acute hemorrhage, acute infarction or space-occupying mass lesion are noted. CT CERVICAL SPINE FINDINGS Seven cervical segments are well visualized. Vertebral body height is well maintained. Osteophytic changes noted from C3-C7. Facet hypertrophic changes are noted. No acute fracture or acute facet abnormality is noted. Surrounding soft tissue structures show evidence of a left-sided pleural effusion of relatively large size. IMPRESSION: CT of the head:  No acute intracranial abnormality noted. CT of the cervical spine: Degenerative changes without acute bony abnormality. Large left pleural effusion. Electronically Signed   By: Inez Catalina M.D.   On: 08/11/2015 16:53   Ct Lumbar Spine Wo Contrast  08/11/2015  CLINICAL DATA:  Status post unwitnessed fall today. Lower extremity and low back pain. Initial encounter. EXAM: CT LUMBAR SPINE WITHOUT CONTRAST TECHNIQUE: Multidetector CT imaging of the lumbar spine was performed without intravenous contrast administration. Multiplanar CT image reconstructions were also generated. COMPARISON:  None. FINDINGS: Vertebral body height and alignment are maintained. A sclerotic lesion in the L3 vertebral body to the right measures 2.8 cm AP by 2.1 cm transverse. Several punctate sclerotic lesions are also identified in the left ilium. Finally, a round sclerotic lesion is seen in the left side of the T11 vertebral body anterior to the pedicle measuring 1.3 cm AP by 1.1 cm transverse. Paraspinous structures demonstrate a small right and small to moderate left pleural effusion.  Neither effusion is completely visualized. Bronchiectatic changes seen the right lower lobe. The patient has extensive retroperitoneal lymphadenopathy. Index paraaortic nodal mass measures approximately 3.4 x 2.9 cm on image 90 tripped. Inter-aortocaval node on image 90 measures approximately 2.1 cm short axis dimension. The patient has mild to moderate left hydronephrosis. Cause for obstruction is not discretely identified but the left ureter is not seen in its entirety. There appears to be bladder wall thickening eccentric to the left. Two small nonobstructing stones are identified the left kidney. There is scattered facet degenerative disease lumbar spine. Minimal disc bulge and endplate spurring are noted at L3-4, L4-5 and L5-S1. IMPRESSION: Negative for fracture. Findings  consistent with metastatic neoplastic process with extensive abdominal and pelvic lymphadenopathy and sclerotic bone lesions identified. There is left hydronephrosis with partial visualization of a lesion in the left side of the urinary bladder. Most likely differential considerations are prostate or bladder carcinoma. These results were called by telephone at the time of interpretation on 08/11/2015 at 5:00 pm to Dr. Esaw Grandchild , who verbally acknowledged these results. Electronically Signed   By: Inge Rise M.D.   On: 08/11/2015 17:01   Dg Chest Port 1 View  08/11/2015  CLINICAL DATA:  Recent fall EXAM: PORTABLE CHEST - 1 VIEW COMPARISON:  08/06/2015 FINDINGS: Cardiac shadow is within normal limits. The lungs are well aerated bilaterally. No focal infiltrate or sizable effusion is noted. IMPRESSION: No acute abnormality noted. Electronically Signed   By: Inez Catalina M.D.   On: 08/11/2015 17:36   Dg Hips Bilat With Pelvis 2v  08/11/2015  CLINICAL DATA:  Status post fall. EXAM: DG HIP (WITH OR WITHOUT PELVIS) 2V BILAT COMPARISON:  None. FINDINGS: There is decreased bone mineralization. There is no evidence of hip fracture or  dislocation. Mild to moderate bilateral hip joint osteoarthritic changes are seen, with joint space narrowing, subchondral sclerosis of the acetabula and osteophyte formation. IMPRESSION: No evidence of fracture or subluxation of bilateral hips. Electronically Signed   By: Fidela Salisbury M.D.   On: 08/11/2015 17:39     CBC  Recent Labs Lab 08/11/15 1716 08/12/15 0506 08/13/15 0350  WBC 15.4* 12.4* 9.7  HGB 12.0* 10.9* 11.8*  HCT 35.9* 33.2* 36.1*  PLT 398 357 316  MCV 86.3 87.4 88.9  MCH 28.8 28.7 29.1  MCHC 33.4 32.8 32.7  RDW 16.2* 16.5* 17.1*  LYMPHSABS 2.0  --   --   MONOABS 1.5*  --   --   EOSABS 0.1  --   --   BASOSABS 0.0  --   --     Chemistries   Recent Labs Lab 08/11/15 1716 08/12/15 0506 08/13/15 0350  NA 142 142 143  K 4.9 4.0 3.8  CL 107 109 108  CO2 23 24 23   GLUCOSE 89 82 91  BUN 27* 26* 24*  CREATININE 1.84* 1.69* 1.55*  CALCIUM 9.7 9.1 9.2  AST  --  29  --   ALT  --  7*  --   ALKPHOS  --  270*  --   BILITOT  --  0.5  --    ------------------------------------------------------------------------------------------------------------------ estimated creatinine clearance is 39.8 mL/min (by C-G formula based on Cr of 1.55). ------------------------------------------------------------------------------------------------------------------  Recent Labs  08/12/15 0506  HGBA1C 5.2   ------------------------------------------------------------------------------------------------------------------ No results for input(s): CHOL, HDL, LDLCALC, TRIG, CHOLHDL, LDLDIRECT in the last 72 hours. ------------------------------------------------------------------------------------------------------------------ No results for input(s): TSH, T4TOTAL, T3FREE, THYROIDAB in the last 72 hours.  Invalid input(s): FREET3 ------------------------------------------------------------------------------------------------------------------ No results for input(s):  VITAMINB12, FOLATE, FERRITIN, TIBC, IRON, RETICCTPCT in the last 72 hours.  Coagulation profile No results for input(s): INR, PROTIME in the last 168 hours.  No results for input(s): DDIMER in the last 72 hours.  Cardiac Enzymes  Recent Labs Lab 08/11/15 2254 08/12/15 0506 08/12/15 1115  TROPONINI 0.03 0.04* 0.04*   ------------------------------------------------------------------------------------------------------------------ Invalid input(s): POCBNP     Time Spent in minutes   25 minutes   Maryem Shuffler M.D on 08/13/2015 at 3:50 PM  Between 7am to 7pm - Pager - [phone]  After 7pm go to [email or web address] - password [facility]  [facility]   Office  [phone]

## 2015-08-13 NOTE — Consult Note (Signed)
Consultation Note Date: 08/13/2015   Patient Name: Kent Mayo  DOB: 20-Jan-1927  MRN: [id number]  Age / Sex: 82 y.o., male  PCP: No primary care provider on file. Referring Physician: Albertine Patricia, Mayo  Reason for Consultation: Establishing goals of care   Clinical Assessment/Narrative: Kent Mayo is a 82 y.o. male from [place] with history of dementia, hypertension, chronic anemia, chronic kidney disease and legally blind was brought to the ER after a fall.  The patient's PSA was found to be 539 in the ER.  He has had multiple recent admissions for urinary obstruction and acute kidney failure.  Urology, Kent Mayo has consulted and notes that the patient has T4 N1 M1 prostate cancer with left ureteral obstruction. CT scan done on admission shows likely metastasis to the bone, liver, and bladder. According to the patient's brother Kent Mayo, the patient significantly decreased his oral intake over the past year and has lost a tremendous amount of weight. The patient states he just does not have a good appetite and things don't taste the same. He denies pain with eating or swallowing.    Palliative medicine was consulted to help clarify goals of care.  I spoke with Dr. Waldron Mayo who mentions that the patient will likely not be in the hospital very long and may not be able to return to his assisted living facility.  Kent Mayo and I met with the patient this morning. He is oriented to self but not to place. He denies any pain. He spoke proudly about his son who lives in [place] and works in [place]. The patient is unable to comprehend his current health status. I spoke with the patient's brother, Kent Mayo. Kent Mayo tells me that the patient's Will indicates that he Kent Mayo) is the person to make financial and health care decisions in the event that his brother cannot, with the exception of CODE  STATUS. The patient indicated that that should be a joint decision between Kent Mayo and the patient's son Kent Mayo.    I discussed hospice services with Kent Mayo. Given the diagnosis of dementia and metastatic prostate cancer combined with significant weight loss and anorexia, the patient likely has less than 6 months to live. Kent Mayo was interested in learning more about hospice and whether or not his brother would be able to return to assisted living with hospice services.   Regarding CODE STATUS, Kent Mayo appeared to be on board with DO NOT RESUSCITATE, but wanted to converse with Kent Mayo before officially changing CODE STATUS. I invited Kent Mayo to call me to discuss his father's situation, and plan to reach out to him this afternoon.   Contacts/Participants in Discussion: Patient and brother Kent Mayo Primary Decision Maker: Kent Mayo   Relationship to Patient Brother HCPOA: Brother Kent Mayo and Son Kent Mayo   SUMMARY OF RECOMMENDATIONS   Continue current scope of treatment.  Have asked social work to consider what benefit hospice services would be to this patient in the       setting of ALF or SNF and discuss this with family.  Will discuss code status with son Kent Mayo. [phone]) and confirm DNR  Family hopes Kent Mayo will be able to return to ALF with Hospice services.     Code Status/Advance Care Planning: Full code - Need to discuss DNR with son Kent Mayo.    Code Status Orders        Start     Ordered   08/11/15 2228  Full code  Continuous     08/11/15 2229    Code Status History    Date Active Date Inactive Code Status Order ID Comments User Context   03/29/2015  5:45 PM 04/05/2015  3:26 PM Full Code [id number]  Kent Mayo ED   03/18/2015  8:53 PM 03/22/2015 10:40 PM Full Code [id number]  Kent Mayo Inpatient   02/23/2013  1:28 AM 02/28/2013  4:13 PM Full Code [id number]  Kent Mayo Inpatient      Other  Directives:None  Symptom Management:   Patient appears comfortable at this time  Palliative Prophylaxis:   Bowel Regimen and Frequent Pain Assessment  Additional Recommendations (Limitations, Scope, Preferences):  Recommend Hospice Services at either ALF or SNF.    Psycho-social/Spiritual:  Support System: Kent Mayo for further Chaplaincy support: no Additional Recommendations: Caregiving  Support/Resources  Prognosis: In the setting of dementia and metastatic prostatic cancer, and anorexia, Kent Mayo is at high risk for an acute event.  He very likely has less than 6 months to live and could die much faster if an acute event were to occur.   Discharge Planning: [place] with Hospice   Chief Complaint/ Primary Diagnoses: Present on Admission:  . Fall at nursing home . HTN (hypertension) . FTT (failure to thrive) in adult . Renal failure (ARF), acute on chronic (HCC) . Fall . Syncope and collapse  I have reviewed the medical record, interviewed the patient and family, and examined the patient. The following aspects are pertinent.  Past Medical History  Diagnosis Date  . Diabetes mellitus without complication ([place])   . Hypertension   . Hearing difficulty of both ears     Bilateral hearing aids  . Blindness     Severe visual impairment OD, Blind OS  . Abnormality of gait   . Peripheral edema   . Glaucoma   . Dyslipidemia   . Nontraumatic cerebral hemorrhage ([place])   . UTI (urinary tract infection)   . Generalized muscle weakness   . Renal disorder   . Dysphagia   . Cognitive communication deficit   . Encephalopathy   . Urinary retention   . Neoplasm of prostate   . Renal failure   . Hyperlipidemia    Social History   Social History  . Marital Status: Single    Spouse Name: N/A  . Number of Children: N/A  . Years of Education: N/A   Social History Main Topics  . Smoking status: Former Research scientist (life sciences)  . Smokeless tobacco: Never Used  .  Alcohol Use: No  . Drug Use: No  . Sexual Activity: Not Asked   Other Topics Concern  . None   Social History Narrative   Family History  Problem Relation Age of Onset  . Hypertension Mother   . Hypertension Father   . Diabetes Mellitus II Brother    Scheduled Meds: . atorvastatin  10 mg Oral QHS  . brimonidine  1 drop Both Eyes TID  . carvedilol  6.25 mg Oral BID WC  . cefTRIAXone (ROCEPHIN)  IV  1 g Intravenous Q24H  . degarelix  240 mg Subcutaneous Once  . donepezil  10 mg Oral BID  . dorzolamide-timolol  1 drop Right Eye BID  . enoxaparin (LOVENOX) injection  40 mg Subcutaneous Daily  . feeding supplement (ENSURE ENLIVE)  237 mL Oral TID BM  . megestrol  400 mg Oral BID  . memantine  10 mg Oral BID  . mirtazapine  15 mg  Oral QHS  . traMADol  50 mg Oral BID  . verapamil  240 mg Oral Daily   Continuous Infusions: . dextrose 5 % and 0.45% NaCl 50 mL/hr at 08/13/15 0854   PRN Meds:.guaiFENesin, LORazepam, senna-docusate Medications Prior to Admission:  Prior to Admission medications   Medication Sig Start Date End Date Taking? Authorizing Provider  acetaminophen (TYLENOL) 650 MG CR tablet Take 650 mg by mouth every 8 (eight) hours.   Yes Historical Provider, Mayo  atorvastatin (LIPITOR) 10 MG tablet Take 10 mg by mouth at bedtime.   Yes Historical Provider, Mayo  brimonidine (ALPHAGAN) 0.2 % ophthalmic solution Place 1 drop into both eyes 3 (three) times daily.   Yes Historical Provider, Mayo  carvedilol (COREG) 6.25 MG tablet Take 6.25 mg by mouth 2 (two) times daily. With food. 08/23/14  Yes Historical Provider, Mayo  donepezil (ARICEPT) 10 MG tablet Take 10 mg by mouth 2 (two) times daily.    Yes Historical Provider, Mayo  dorzolamide-timolol (COSOPT) 22.3-6.8 MG/ML ophthalmic solution Place 1 drop into the right eye 2 (two) times daily.   Yes Historical Provider, Mayo  finasteride (PROSCAR) 5 MG tablet Take 5 mg by mouth daily. 10/11/14  Yes Historical Provider, Mayo  guaiFENesin  (MUCINEX) 600 MG 12 hr tablet Take 600 mg by mouth 2 (two) times daily as needed for to loosen phlegm.   Yes Historical Provider, Mayo  LORazepam (ATIVAN) 0.5 MG tablet Take 0.5 mg by mouth at bedtime as needed (insomnia).   Yes Historical Provider, Mayo  megestrol (MEGACE) 400 MG/10ML suspension Take 10 mLs (400 mg total) by mouth 2 (two) times daily. 08/06/15  Yes Orlie Dakin, Mayo  memantine (NAMENDA) 10 MG tablet Take 10 mg by mouth 2 (two) times daily.   Yes Historical Provider, Mayo  mirtazapine (REMERON) 15 MG tablet Take 15 mg by mouth at bedtime.   Yes Historical Provider, Mayo  NUTRITIONAL SUPPLEMENT LIQD Take 1 Can by mouth 3 (three) times daily. Between meals. "Med Pass."   Yes Historical Provider, Mayo  senna-docusate (SENOKOT-S) 8.6-50 MG per tablet Take 1 tablet by mouth at bedtime as needed for mild constipation. 03/22/15  Yes [name], Mayo  traMADol (ULTRAM) 50 MG tablet Take 50 mg by mouth 2 (two) times daily.   Yes Historical Provider, Mayo  verapamil (CALAN-SR) 240 MG CR tablet Take 240 mg by mouth daily.    Yes Historical Provider, Mayo   No Known Allergies  Review of Systems  Physical Exam  Vital Signs: BP 159/73 mmHg  Pulse 65  Temp(Src) 98 F (36.7 C) (Oral)  Resp 16  Ht 6' 2" (1.88 m)  Wt 82.781 kg (182 lb 8 oz)  BMI 23.42 kg/m2  SpO2 100%  SpO2: SpO2: 100 % O2 Device:SpO2: 100 % O2 Flow Rate: .   IO: Intake/output summary:  Intake/Output Summary (Last 24 hours) at 08/13/15 1022 Last data filed at 08/13/15 0800  Gross per 24 hour  Intake    820 ml  Output    450 ml  Net    370 ml    LBM: Last BM Date: 08/11/15 Baseline Weight: Weight: 102.967 kg (227 lb) Most recent weight: Weight: 82.781 kg (182 lb 8 oz)      Palliative Assessment/Data:  Flowsheet Rows        Most Recent Value   Intake Tab    Referral Department  Hospitalist   Unit at Time of Referral  Cardiac/Telemetry Unit   Palliative Care Primary Diagnosis  Cancer   Date Notified  08/11/15   Palliative  Care Type  New Palliative care   Reason for referral  Clarify Goals of Care   Date of Admission  08/11/15   Date first seen by Palliative Care  08/12/15   # of days Palliative referral response time  1 Day(s)   # of days IP prior to Palliative referral  0   Clinical Assessment    Palliative Performance Scale Score  30%   Pain Max last 24 hours  3   Pain Min Last 24 hours  1   Anxiety Max Last 24 Hours  5   Anxiety Min Last 24 Hours  1   Psychosocial & Spiritual Assessment    Social Work Systems developer patient/family wishes with healthcare team, Education on [place]    Patient/Family meeting held?  No   Palliative Care Outcomes  Counseled regarding hospice, Clarified goals of care   Palliative Care follow-up planned  Yes, Facility      Additional Data Reviewed:  CBC:    Component Value Date/Time   WBC 9.7 08/13/2015 0350   HGB 11.8* 08/13/2015 0350   HCT 36.1* 08/13/2015 0350   PLT 316 08/13/2015 0350   MCV 88.9 08/13/2015 0350   NEUTROABS 11.8* 08/11/2015 1716   LYMPHSABS 2.0 08/11/2015 1716   MONOABS 1.5* 08/11/2015 1716   EOSABS 0.1 08/11/2015 1716   BASOSABS 0.0 08/11/2015 1716   Comprehensive Metabolic Panel:    Component Value Date/Time   NA 143 08/13/2015 0350   K 3.8 08/13/2015 0350   CL 108 08/13/2015 0350   CO2 23 08/13/2015 0350   BUN 24* 08/13/2015 0350   CREATININE 1.55* 08/13/2015 0350   GLUCOSE 91 08/13/2015 0350   CALCIUM 9.2 08/13/2015 0350   AST 29 08/12/2015 0506   ALT 7* 08/12/2015 0506   ALKPHOS 270* 08/12/2015 0506   BILITOT 0.5 08/12/2015 0506   PROT 7.2 08/12/2015 0506   ALBUMIN 2.6* 08/12/2015 0506     Time In: 9:00 Time Out: 10:00 Time Total: 60 minutes Greater than 50%  of this time was spent counseling and coordinating care related to the above assessment and plan.  Signed by:  Melton Alar, PA-C  08/13/2015, 10:22 AM  Please contact Palliative Medicine Team phone at [phone] for  questions and concerns.

## 2015-08-13 NOTE — Clinical Social Work Note (Signed)
CSW had a long conversation with main contact (Brother Rolan Bucco). Rolan Bucco continues to insist that the patient return to [facility] ALF. CSW has provided necessary clinical information to facility so that they can make a decision. CSW informed the facility that the patient will likely return with hospice services if they can accept him back. CSW re-iterated to Rolan Bucco that if the facility states they are unwilling to accept him back then the family will need to prepare to either bring the patient home or pay privately for SNF level of care. CSW encouraged Rolan Bucco to contact the Development worker, international aid of the facility as Rolan Bucco states that he has had several conversations with the ED in the past regarding his brother. CSW will continue to follow.   Liz Beach MSW, [place], [place], JI:[id number]

## 2015-08-13 NOTE — Progress Notes (Signed)
Chaplain presented to the 3W Unit to follow up on a request to complete Advance Directive. Chaplain spoke to the patient's Nurse for a medical update on the patient, and was informed that there was some doubt as to whether the patient would be able to complete the Advance Directive due to some of his existing medical issues and mental compteance Chaplain presented to the patient after speaking to the Nurse as a follow up to her input,  to provide spiritual care support, and to also to access the patient's ability to competence to complete an Advance Directive. After dialogue with the patient regarding the same Chaplain was in agreement with the Nurses's decision.  Chaplain will continue to follow up with the patient for spiritual care support. Chaplain Yaakov Guthrie [phone]

## 2015-08-13 NOTE — Progress Notes (Addendum)
Patient ID: Kent Mayo, male   DOB: [birth year], 82 y.o.   MRN: [id number]    Subjective: Kent Mayo is without major complaint today.   He has what appears to be metastatic prostate cancer with a PSA of >500, a large mass at the left bladder base and prostate with left ureteral obstruction and metastatic disease.   He currently has a condom cath draining clear urine and his PVR was 31ml.  He remains on antibiotic therapy with a pending urine culture.  He had enterococcus in December on urine culture.  He has received an induction dose of firmagon 240mg  sq.  His baseline testosterone is low at 104.  It is my experience that patients with hypogonadism can develop more aggressive prostate cancer which appears to be the case.   He has a long history of BPH with BOO and an elevated PSA with the last in our office about 13 but his prostate was quite large at almost 181ml.  He has been on long term finasteride.  His PSA had been stable for about 5 years with a negative biopsy in the past but he has not been seen in about 2 years in our office and missed an annual f/u appt.   His creatinine has fallen since admission. ROS:  Review of Systems  Unable to perform ROS: dementia    Anti-infectives: Anti-infectives    Start     Dose/Rate Route Frequency Ordered Stop   08/12/15 0115  cefTRIAXone (ROCEPHIN) 1 g in dextrose 5 % 50 mL IVPB     1 g 100 mL/hr over 30 Minutes Intravenous Every 24 hours 08/12/15 0113        Current Facility-Administered Medications  Medication Dose Route Frequency Provider Last Rate Last Dose  . atorvastatin (LIPITOR) tablet 10 mg  10 mg Oral QHS Rise Patience, MD   10 mg at 08/12/15 2129  . brimonidine (ALPHAGAN) 0.2 % ophthalmic solution 1 drop  1 drop Both Eyes TID Rise Patience, MD   1 drop at 08/12/15 2139  . carvedilol (COREG) tablet 6.25 mg  6.25 mg Oral BID WC Rise Patience, MD   6.25 mg at 08/12/15 1807  . cefTRIAXone (ROCEPHIN) 1 g in dextrose 5  % 50 mL IVPB  1 g Intravenous Q24H Erenest Blank, RPH   1 g at 08/13/15 0100  . degarelix (FIRMAGON) injection 240 mg  240 mg Subcutaneous Once Albertine Patricia, MD      . dextrose 5 %-0.45 % sodium chloride infusion   Intravenous Continuous Albertine Patricia, MD 50 mL/hr at 08/12/15 1133    . donepezil (ARICEPT) tablet 10 mg  10 mg Oral BID Rise Patience, MD   10 mg at 08/12/15 0047  . dorzolamide-timolol (COSOPT) 22.3-6.8 MG/ML ophthalmic solution 1 drop  1 drop Right Eye BID Rise Patience, MD   1 drop at 08/12/15 2200  . enoxaparin (LOVENOX) injection 40 mg  40 mg Subcutaneous Daily Rise Patience, MD   40 mg at 08/12/15 1100  . feeding supplement (ENSURE ENLIVE) (ENSURE ENLIVE) liquid 237 mL  237 mL Oral TID BM Rise Patience, MD   237 mL at 08/12/15 1811  . finasteride (PROSCAR) tablet 5 mg  5 mg Oral Daily Rise Patience, MD   5 mg at 08/12/15 1807  . guaiFENesin (MUCINEX) 12 hr tablet 600 mg  600 mg Oral BID PRN Rise Patience, MD      . LORazepam (  ATIVAN) tablet 0.5 mg  0.5 mg Oral QHS PRN Rise Patience, MD      . megestrol (MEGACE) 400 MG/10ML suspension 400 mg  400 mg Oral BID Rise Patience, MD   400 mg at 08/12/15 0046  . memantine (NAMENDA) tablet 10 mg  10 mg Oral BID Rise Patience, MD   10 mg at 08/12/15 2129  . mirtazapine (REMERON) tablet 15 mg  15 mg Oral QHS Rise Patience, MD   15 mg at 08/12/15 2129  . senna-docusate (Senokot-S) tablet 1 tablet  1 tablet Oral QHS PRN Rise Patience, MD      . traMADol Veatrice Bourbon) tablet 50 mg  50 mg Oral BID Rise Patience, MD   50 mg at 08/12/15 2130  . verapamil (CALAN-SR) CR tablet 240 mg  240 mg Oral Daily Rise Patience, MD   240 mg at 08/12/15 1807     Objective: Vital signs in last 24 hours: Temp:  [97.5 F (36.4 C)-98.8 F (37.1 C)] 98 F (36.7 C) (01/10 0500) Pulse Rate:  [65-73] 65 (01/10 0500) Resp:  [15-16] 16 (01/10 0500) BP: (140-159)/(71-86) 159/73  mmHg (01/10 0500) SpO2:  [98 %-100 %] 100 % (01/10 0500) Weight:  [82.781 kg (182 lb 8 oz)] 82.781 kg (182 lb 8 oz) (01/10 0500)  Intake/Output from previous day: 01/09 0701 - 01/10 0700 In: 700 [P.O.:700] Out: 450 [Urine:450] Intake/Output this shift:     Physical Exam  Constitutional: He is well-developed, well-nourished, and in no distress.  Genitourinary:  Foley draining clear urine.     Lab Results:   Recent Labs  08/12/15 0506 08/13/15 0350  WBC 12.4* 9.7  HGB 10.9* 11.8*  HCT 33.2* 36.1*  PLT 357 316   BMET  Recent Labs  08/12/15 0506 08/13/15 0350  NA 142 143  K 4.0 3.8  CL 109 108  CO2 24 23  GLUCOSE 82 91  BUN 26* 24*  CREATININE 1.69* 1.55*  CALCIUM 9.1 9.2   PT/INR No results for input(s): LABPROT, INR in the last 72 hours. ABG No results for input(s): PHART, HCO3 in the last 72 hours.  Invalid input(s): PCO2, PO2  Studies/Results: Ct Abdomen Pelvis Wo Contrast  08/11/2015  CLINICAL DATA:  Unwitnessed fall EXAM: CT ABDOMEN AND PELVIS WITHOUT CONTRAST TECHNIQUE: Multidetector CT imaging of the abdomen and pelvis was performed following the standard protocol without IV contrast. COMPARISON:  08/11/2015 FINDINGS: Lower chest: There is an at least moderate partially visualized left pleural effusion. There is a small right pleural effusion. Bilaterally there is underlying atelectasis. Hepatobiliary: Mild sludge layering dependently in the gallbladder. Gallbladder is mildly distended with no evidence of inflammation surrounding it. Liver is normal. Pancreas: Normal Spleen: Normal Adrenals/Urinary Tract: There is moderate to severe dilatation of the left ureter and moderate left hydronephrosis. Several tiny nonobstructing left renal calculi are noted. There is evidence of a large lobulated soft tissue mass involving the mid to inferior left aspect of the bladder measuring about 7 by 7 x 5.5 cm. Stomach/Bowel: Nonobstructive bowel gas pattern. Diverticulosis of  the sigmoid colon. Mild to moderate fecal impaction in the rectum which is distended to a diameter of 7.5 cm. Vascular/Lymphatic: Atherosclerotic aortoiliac calcification. Soft tissue fullness in the retroperitoneum in both the. Aortic and aortocaval region. This suggests adenopathy as described on report of CT scan performed 08/11/2015. There is bulky left pelvic sidewall adenopathy. Reproductive: The mass involving the left inferior bladder could arise from either the prostate  or the bladder. Musculoskeletal: Numerous sclerotic lesions throughout the thoracolumbar spine and sacrum. Sclerotic lesions are also seen in the iliac bones and sacrum. IMPRESSION: Evidence of carcinoma arising either from the prostate or the bladder, but involving both organs and causing left hydronephrosis. Evidence of metastatic adenopathy in the retroperitoneum and in the pelvis. Bilateral pleural effusions. Electronically Signed   By: Skipper Cliche M.D.   On: 08/11/2015 20:19   Dg Knee 1-2 Views Left  08/12/2015  CLINICAL DATA:  Bilateral knee pain. EXAM: LEFT KNEE - 1-2 VIEW; RIGHT KNEE - 1-2 VIEW COMPARISON:  Left knee radiographs 08/11/2015 FINDINGS: Moderate tricompartmental degenerative changes bilaterally. No acute bony findings or osteochondral abnormality. No definite joint effusion. IMPRESSION: Moderate degenerative changes but no acute bony findings or joint effusion. Electronically Signed   By: Marijo Sanes M.D.   On: 08/12/2015 08:09   Dg Knee 1-2 Views Right  08/12/2015  CLINICAL DATA:  Bilateral knee pain. EXAM: LEFT KNEE - 1-2 VIEW; RIGHT KNEE - 1-2 VIEW COMPARISON:  Left knee radiographs 08/11/2015 FINDINGS: Moderate tricompartmental degenerative changes bilaterally. No acute bony findings or osteochondral abnormality. No definite joint effusion. IMPRESSION: Moderate degenerative changes but no acute bony findings or joint effusion. Electronically Signed   By: Marijo Sanes M.D.   On: 08/12/2015 08:09   Dg  Tibia/fibula Left  08/11/2015  CLINICAL DATA:  Recent fall, initial encounter EXAM: LEFT TIBIA AND FIBULA - 2 VIEW COMPARISON:  None. FINDINGS: No acute fracture or dislocation is noted. There is some bony bridging between the distal tibia and distal fibula. No joint effusion is seen no soft tissue abnormalities are noted. IMPRESSION: No acute abnormality seen. Electronically Signed   By: Inez Catalina M.D.   On: 08/11/2015 17:34   Ct Head Wo Contrast  08/11/2015  CLINICAL DATA:  Unwitnessed fall with headaches and neck pain, initial encounter EXAM: CT HEAD WITHOUT CONTRAST CT CERVICAL SPINE WITHOUT CONTRAST TECHNIQUE: Multidetector CT imaging of the head and cervical spine was performed following the standard protocol without intravenous contrast. Multiplanar CT image reconstructions of the cervical spine were also generated. COMPARISON:  08/06/2015 FINDINGS: CT HEAD FINDINGS Bony calvarium is intact. Postsurgical changes are noted in the right orbit. Mild atrophic changes are noted. Mild chronic white matter ischemic change is seen. No findings to suggest acute hemorrhage, acute infarction or space-occupying mass lesion are noted. CT CERVICAL SPINE FINDINGS Seven cervical segments are well visualized. Vertebral body height is well maintained. Osteophytic changes noted from C3-C7. Facet hypertrophic changes are noted. No acute fracture or acute facet abnormality is noted. Surrounding soft tissue structures show evidence of a left-sided pleural effusion of relatively large size. IMPRESSION: CT of the head:  No acute intracranial abnormality noted. CT of the cervical spine: Degenerative changes without acute bony abnormality. Large left pleural effusion. Electronically Signed   By: Inez Catalina M.D.   On: 08/11/2015 16:53   Ct Chest Wo Contrast  08/12/2015  CLINICAL DATA:  82 year old male with failure to thrive. Prostate cancer. Pleural effusion. EXAM: CT CHEST WITHOUT CONTRAST TECHNIQUE: Multidetector CT imaging  of the chest was performed following the standard protocol without IV contrast. COMPARISON:  08/11/2015 and 07/03/2025 chest x-ray FINDINGS: Moderate-size left-sided pleural effusion. Consolidation left lower lobe. No obstructing lesion of the bronchi of the left lower lobe and therefore this may represent passive atelectasis or infiltrate. Small right sided pleural effusion. Anterior right upper lobe and right middle lobe tiny nodules with suggestion of small calcifications may represent  small granulomas although metastatic disease not excluded. Noncalcified left upper lobe 3 mm nodule of indeterminate etiology, metastatic disease not excluded. Mediastinal adenopathy. Index lymph nodes pretracheal region with transverse dimension of 2.6 x 1.9 cm and 2.7 x 2.4 cm. Preaortic adenopathy measures 2.3 x 1.9 cm. Retrocrural adenopathy measures 1.8 x 1.7 cm. Diffuse osseous metastatic disease. Sclerotic metastatic foci seen throughout the lower cervical spine and throughout the thoracic spine upper lumbar spine. At the T9 level, circumferential soft tissue component tumor with possibly of epidural spread on the right. There may be epidural spread of tumor on the right at the T8 level and T6 level. Thoracic spine MR would be necessary for further delineation. Multiple metastatic rib lesions most destructive involving the posterior left sixth rib. Findings suspicious for 4.6 x 6.1 cm liver lesion although incompletely assessed on the present unenhanced CT chest. Mild enlargement of the right adrenal gland may represent hyperplasia or spread of tumor. Marked coronary artery calcifications. Heart size top-normal to slightly enlarged. 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 IMPRESSION: Moderate-size left-sided pleural effusion. Consolidation left lower lobe. No obstructing lesion of the bronchi of the left lower lobe and therefore this may represent passive atelectasis or infiltrate. Small right sided pleural  effusion. Anterior right upper lobe and right middle lobe tiny nodules with suggestion of small calcifications may represent small granulomas although metastatic disease not excluded. Noncalcified left upper lobe 3 mm nodule of indeterminate etiology, metastatic disease not excluded. Mediastinal adenopathy.  Retrocrural adenopathy. Diffuse osseous metastatic disease. Sclerotic metastatic foci seen throughout the lower cervical spine and throughout the thoracic spine upper lumbar spine. At the T9 level, circumferential soft tissue component tumor with possibly of epidural spread on the right. There may be epidural spread of tumor on the right at the T8 level and T6 level. Thoracic spine MR would be necessary for further delineation. Multiple metastatic rib lesions most destructive involving the posterior left sixth rib. Findings suspicious for 4.6 x 6.1 cm liver lesion although incompletely assessed on the present unenhanced CT chest. Mild enlargement of the right adrenal gland may represent hyperplasia or spread of tumor. Marked coronary artery calcifications. Heart size top-normal to slightly enlarged. 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 Electronically Signed   By: Genia Del M.D.   On: 08/12/2015 09:12   Ct Cervical Spine Wo Contrast  08/11/2015  CLINICAL DATA:  Unwitnessed fall with headaches and neck pain, initial encounter EXAM: CT HEAD WITHOUT CONTRAST CT CERVICAL SPINE WITHOUT CONTRAST TECHNIQUE: Multidetector CT imaging of the head and cervical spine was performed following the standard protocol without intravenous contrast. Multiplanar CT image reconstructions of the cervical spine were also generated. COMPARISON:  08/06/2015 FINDINGS: CT HEAD FINDINGS Bony calvarium is intact. Postsurgical changes are noted in the right orbit. Mild atrophic changes are noted. Mild chronic white matter ischemic change is seen. No findings to suggest acute hemorrhage, acute infarction or  space-occupying mass lesion are noted. CT CERVICAL SPINE FINDINGS Seven cervical segments are well visualized. Vertebral body height is well maintained. Osteophytic changes noted from C3-C7. Facet hypertrophic changes are noted. No acute fracture or acute facet abnormality is noted. Surrounding soft tissue structures show evidence of a left-sided pleural effusion of relatively large size. IMPRESSION: CT of the head:  No acute intracranial abnormality noted. CT of the cervical spine: Degenerative changes without acute bony abnormality. Large left pleural effusion. Electronically Signed   By: Inez Catalina M.D.   On: 08/11/2015 16:53  Ct Lumbar Spine Wo Contrast  08/11/2015  CLINICAL DATA:  Status post unwitnessed fall today. Lower extremity and low back pain. Initial encounter. EXAM: CT LUMBAR SPINE WITHOUT CONTRAST TECHNIQUE: Multidetector CT imaging of the lumbar spine was performed without intravenous contrast administration. Multiplanar CT image reconstructions were also generated. COMPARISON:  None. FINDINGS: Vertebral body height and alignment are maintained. A sclerotic lesion in the L3 vertebral body to the right measures 2.8 cm AP by 2.1 cm transverse. Several punctate sclerotic lesions are also identified in the left ilium. Finally, a round sclerotic lesion is seen in the left side of the T11 vertebral body anterior to the pedicle measuring 1.3 cm AP by 1.1 cm transverse. Paraspinous structures demonstrate a small right and small to moderate left pleural effusion. Neither effusion is completely visualized. Bronchiectatic changes seen the right lower lobe. The patient has extensive retroperitoneal lymphadenopathy. Index paraaortic nodal mass measures approximately 3.4 x 2.9 cm on image 90 tripped. Inter-aortocaval node on image 90 measures approximately 2.1 cm short axis dimension. The patient has mild to moderate left hydronephrosis. Cause for obstruction is not discretely identified but the left ureter  is not seen in its entirety. There appears to be bladder wall thickening eccentric to the left. Two small nonobstructing stones are identified the left kidney. There is scattered facet degenerative disease lumbar spine. Minimal disc bulge and endplate spurring are noted at L3-4, L4-5 and L5-S1. IMPRESSION: Negative for fracture. Findings consistent with metastatic neoplastic process with extensive abdominal and pelvic lymphadenopathy and sclerotic bone lesions identified. There is left hydronephrosis with partial visualization of a lesion in the left side of the urinary bladder. Most likely differential considerations are prostate or bladder carcinoma. These results were called by telephone at the time of interpretation on 08/11/2015 at 5:00 pm to Dr. Esaw Grandchild , who verbally acknowledged these results. Electronically Signed   By: Inge Rise M.D.   On: 08/11/2015 17:01   Dg Chest Port 1 View  08/11/2015  CLINICAL DATA:  Recent fall EXAM: PORTABLE CHEST - 1 VIEW COMPARISON:  08/06/2015 FINDINGS: Cardiac shadow is within normal limits. The lungs are well aerated bilaterally. No focal infiltrate or sizable effusion is noted. IMPRESSION: No acute abnormality noted. Electronically Signed   By: Inez Catalina M.D.   On: 08/11/2015 17:36   Dg Hips Bilat With Pelvis 2v  08/11/2015  CLINICAL DATA:  Status post fall. EXAM: DG HIP (WITH OR WITHOUT PELVIS) 2V BILAT COMPARISON:  None. FINDINGS: There is decreased bone mineralization. There is no evidence of hip fracture or dislocation. Mild to moderate bilateral hip joint osteoarthritic changes are seen, with joint space narrowing, subchondral sclerosis of the acetabula and osteophyte formation. IMPRESSION: No evidence of fracture or subluxation of bilateral hips. Electronically Signed   By: Fidela Salisbury M.D.   On: 08/11/2015 17:39   Labs and films reviewed.   Assessment and Plan: Apparent T4 N1 M1 prostate cancer with left ureteral obstruction and  baseline hypogonadism.  At this point treatment with androgen ablation with firmagon monthly would be most appropriate.   I would not consider stent or perc for the left hydro with his current renal function.     He will need a repeat PSA and testosterone in about a month.   I will try to arrange office f/u for that time frame.   Finasteride can be discontinued.  I was initially mistaken that he had a foley catheter and not a condom catheter and have revised my comments  accordingly.   LOS: 2 days    [name] 08/13/2015 [id number]

## 2015-08-13 NOTE — NC FL2 (Signed)
[place] LEVEL OF CARE SCREENING TOOL     IDENTIFICATION  Patient Name: Kent Mayo Birthdate: 05-Jan-1927 Sex: male Admission Date (Current Location): 08/11/2015  [facility] and [place] Number:  Herbalist and Address:  [facility], [place] [address] [place], [place] [address]      Provider Number: [id number]  Attending Physician Name and Address:  Albertine Patricia, MD  Relative Name and Phone Number:       Current Level of Care: Hospital Recommended Level of Care: SNF Prior Approval Number:    Date Approved/Denied:   PASRR Number: [id number]  Discharge Plan: SNF    Current Diagnoses: Patient Active Problem List   Diagnosis Date Noted  . Syncope and collapse 08/11/2015  . Fall at nursing home 08/11/2015  . Renal failure (ARF), acute on chronic (HCC) 08/11/2015  . Fall 08/11/2015  . UTI (urinary tract infection) 03/30/2015  . Acute renal failure ([place]) 03/29/2015  . Hyperammonemia ([place])   . Urinary retention 03/20/2015  . Acute encephalopathy 03/20/2015  . AKI (acute kidney injury) ([place])   . Leukocytosis   . CN (constipation)   . ARF (acute renal failure) ([place]) 03/18/2015  . History of CVA (cerebrovascular accident) 03/18/2015  . FTT (failure to thrive) in adult 03/18/2015  . CVA (cerebral vascular accident) ([place]) 04/26/2013  . Pont[place] hemorrhage ([place]) 02/24/2013  . Dizziness and giddiness 02/23/2013  . HTN (hypertension) 02/23/2013  . Diabetes mellitus ([place]) 02/23/2013  . Hyperlipidemia 02/23/2013    Orientation RESPIRATION BLADDER Height & Weight    Self  Normal Incontinent 6\' 2"  (188 cm) 181 lbs.  BEHAVIORAL SYMPTOMS/MOOD NEUROLOGICAL BOWEL NUTRITION STATUS   (NONE)  (NONE) Incontinent Diet (Regular diet)  AMBULATORY STATUS COMMUNICATION OF NEEDS Skin   Extensive Assist Verbally Normal                       Personal Care Assistance Level of Assistance  Bathing, Feeding, Dressing Bathing  Assistance: Maximum assistance Feeding assistance: Maximum assistance Dressing Assistance: Maximum assistance     Functional Limitations Info  Sight, Hearing, Speech Sight Info: Impaired Hearing Info: Impaired Speech Info: Adequate    SPECIAL CARE FACTORS FREQUENCY  PT (By licensed PT)     PT Frequency: 3/week              Contractures Contractures Info: Not present    Additional Factors Info  Allergies, Code Status, Psychotropic Code Status Info: Full Allergies Info: NKDA Psychotropic Info: Namenda, Remeron         Current Medications (08/13/2015):  This is the current hospital active medication list Current Facility-Administered Medications  Medication Dose Route Frequency Provider Last Rate Last Dose  . atorvastatin (LIPITOR) tablet 10 mg  10 mg Oral QHS Rise Patience, MD   10 mg at 08/12/15 2129  . brimonidine (ALPHAGAN) 0.2 % ophthalmic solution 1 drop  1 drop Both Eyes TID Rise Patience, MD   1 drop at 08/13/15 0853  . carvedilol (COREG) tablet 6.25 mg  6.25 mg Oral BID WC Rise Patience, MD   6.25 mg at 08/13/15 0853  . cefTRIAXone (ROCEPHIN) 1 g in dextrose 5 % 50 mL IVPB  1 g Intravenous Q24H Erenest Blank, RPH   1 g at 08/13/15 0100  . degarelix (FIRMAGON) injection 240 mg  240 mg Subcutaneous Once Silver Huguenin Elgergawy, MD      . dextrose 5 %-0.45 % sodium chloride infusion  Intravenous Continuous Albertine Patricia, MD 50 mL/hr at 08/13/15 0854    . donepezil (ARICEPT) tablet 10 mg  10 mg Oral BID Rise Patience, MD   10 mg at 08/13/15 0853  . dorzolamide-timolol (COSOPT) 22.3-6.8 MG/ML ophthalmic solution 1 drop  1 drop Right Eye BID Rise Patience, MD   1 drop at 08/13/15 0854  . enoxaparin (LOVENOX) injection 40 mg  40 mg Subcutaneous Daily Rise Patience, MD   40 mg at 08/13/15 0854  . feeding supplement (ENSURE ENLIVE) (ENSURE ENLIVE) liquid 237 mL  237 mL Oral TID BM Rise Patience, MD   237 mL at 08/13/15 1000  .  guaiFENesin (MUCINEX) 12 hr tablet 600 mg  600 mg Oral BID PRN Rise Patience, MD      . LORazepam (ATIVAN) tablet 0.5 mg  0.5 mg Oral QHS PRN Rise Patience, MD      . megestrol (MEGACE) 400 MG/10ML suspension 400 mg  400 mg Oral BID Rise Patience, MD   400 mg at 08/13/15 0853  . memantine (NAMENDA) tablet 10 mg  10 mg Oral BID Rise Patience, MD   10 mg at 08/13/15 0853  . mirtazapine (REMERON) tablet 15 mg  15 mg Oral QHS Rise Patience, MD   15 mg at 08/12/15 2129  . senna-docusate (Senokot-S) tablet 1 tablet  1 tablet Oral QHS PRN Rise Patience, MD      . traMADol Veatrice Bourbon) tablet 50 mg  50 mg Oral BID Rise Patience, MD   50 mg at 08/13/15 0853  . verapamil (CALAN-SR) CR tablet 240 mg  240 mg Oral Daily Rise Patience, MD   240 mg at 08/13/15 [id number]     Discharge Medications: Please see discharge summary for a list of discharge medications.  Relevant Imaging Results:  Relevant Lab Results:   Additional Information Patient to receive palliative services at SNF.   Rigoberto Noel, LCSW

## 2015-08-13 NOTE — Progress Notes (Signed)
Speech Language Pathology Treatment: Dysphagia  Patient Details Name: Kent Mayo MRN: [id number] DOB: 09/08/1926 Today's Date: 08/13/2015 Time: 0811-0845 SLP Time Calculation (min) (ACUTE ONLY): 34 min  Assessment / Plan / Recommendation Clinical Impression  Pt today with much improved level of alertness and much more agreeable to accept po intake.  Much improved swallowing today due to improved alertness.   SLP assisted pt to consume milk *7 ounces, orange juice *4 ounces, grits *3 bites, ice cream *3 ounces, peaches *2 ounces.  No indication of airway compromise with any po intake consumed.  Pt able to hold his own cup/carton to self feed liquids, however needed physical assist for use of utencils.  He is impulsive and takes large sequential boluses = no indication of aspiration/airway compromise.  Note palliative referral pending- suspect due to metastatic disease.    SLP recommends to advance diet to regular/thin with full assistance.  He may self feed better with "finger foods" also.  No follow up indicated at this time as all education completed.  Thanks for this order.    HPI HPI: 82 yo male adm to [facility] from ALF after fall.  PMH + for dementia, hearing loss, blindness, renal fx, UTI, cognitive communication deficit, poor intake over one month - pt on Megace.  Pt found to have bilateral pleural effusions, ? left atx vs infiltrate, diffuse osteous metastatic disease, mediastinal adenopathy per CT scan.  Anterior spurring noted C4-C5, C6-C6, C6-C7.  Bedside swallow eval ordered as pt failed RNSSS due to decreased ability to follow commands.   CT head negative for acute change, uncertrain if pt was able to have MRI.        SLP Plan  All goals met     Recommendations  Diet recommendations: Regular;Thin liquid Liquids provided via: Straw Medication Administration: Whole meds with puree Supervision: Full supervision/cueing for compensatory strategies;Staff to assist with self  feeding Compensations: Slow rate;Small sips/bites (use straw) Postural Changes and/or Swallow Maneuvers: Upright 30-60 min after meal;Seated upright 90 degrees              Oral Care Recommendations: Oral care BID Follow up Recommendations: None Plan: All goals met    , MS CCC SLP [phone]    

## 2015-08-13 NOTE — Progress Notes (Signed)
Notified Dr. Jeffie Pollock patient does not have foley catheter.  Patient has condom cath.  Bladder scan with 70 cc.  MD stated to continue with condom cath.  Sanda Linger

## 2015-08-13 NOTE — Evaluation (Signed)
Occupational Therapy Evaluation Patient Details Name: Kent Mayo MRN: [id number] DOB: Jul 18, 1927 Today's Date: 08/13/2015    History of Present Illness 82 y.o. male from ALF with history of dementia, hypertension, chronic anemia, chronic kidney disease and legally blind was brought to the ER after a fall. The patient's PSA was found to be 539 in the ER. He has had multiple recent admissions for urinary obstruction and acute kidney failure. Urology, Dr. Jeffie Pollock has consulted and notes that the patient has T4 N1 M1 prostate cancer with left ureteral obstruction. CT scan done on admission shows likely metastasis to the bone, liver, and bladder. According to the patient's brother Kent Mayo, the patient significantly decreased his oral intake over the past year and has lost a tremendous amount of weight.    Clinical Impression   Pt admitted with above and presents to OT with functional deficits impacting his ability to complete ADLs at [facility].  Pt very hard of hearing and legally blind with some vision in Rt eye.  Completed bed mobility with mod-max assist, with pt able to initiate movements and with less c/o pain when allowed to initiate LLE mobility.  Pt demonstrated posterior LOB when attempting to don socks, requiring mod assist to maintain sitting balance.  Sit > stand with initial max assist progressing to mod assist.  Pt requires increased cues and assist to locate items due to visual impairments.  Pt will benefit from acute OT to increase functional mobility and participation in ADLs.  Note pt is from ALF, however recommend SNF at this point unless ALF is capable of providing necessary level of care (max-total assist).    Follow Up Recommendations  SNF    Equipment Recommendations  None recommended by OT    Recommendations for Other Services       Precautions / Restrictions Precautions Precautions: Fall Precaution Comments: pt blind and very HOH. Restrictions Weight Bearing  Restrictions: No      Mobility Bed Mobility Overal bed mobility: Needs Assistance Bed Mobility: Rolling;Sidelying to Sit;Sit to Sidelying Rolling: Mod assist   Supine to sit: HOB elevated;Max assist   Sit to sidelying: Mod assist General bed mobility comments: Engaged in rolling and bed mobility with pt initially crying out in pain at LLE with therapist assisting with positioning, however when pt initiated movements he had less pain in LLE.  Mod assist to help at trunk for sidelying to sit with pt able to manage bringing legs off EOB.  Transfers Overall transfer level: Needs assistance   Transfers: Sit to/from Stand Sit to Stand: Max assist         General transfer comment: Max assist sit <> stand with pt requiring manual facilitation for anterior weight shift. Pt maintained standing balance for 2-3 mins with min-mod assist to maintain upright.    Balance Overall balance assessment: Needs assistance                                          ADL Overall ADL's : Needs assistance/impaired     Grooming: Minimal assistance;Set up Grooming Details (indicate cue type and reason): setup due to visual impairments     Lower Body Bathing: Moderate assistance;Cueing for safety;Cueing for sequencing;Sit to/from stand Lower Body Bathing Details (indicate cue type and reason): Pt requires mod assist sit > stand and physical assist to maintain standing     Lower Body Dressing: Maximal assistance;Cueing  for safety;Cueing for sequencing;Sit to/from stand Lower Body Dressing Details (indicate cue type and reason): Pt requires mod assist sit > stand and physical assist for standing balance with and without functional activity             Functional mobility during ADLs: Moderate assistance;+2 for physical assistance General ADL Comments: Mod assist for bed mobility and sit > stand at EOB.  Did not attempt transfer from EOB due to pt pain and instability.  Recommend +2  for transfers and OOB mobility.     Vision Vision Assessment?: Vision impaired- to be further tested in functional context Additional Comments: Pt is legally blind.  Pt reports no vision in Lt eye and approx 2/3 vision intact in Rt eye          Pertinent Vitals/Pain Pain Assessment: Faces Faces Pain Scale: Hurts whole lot Pain Location: denied pain, but when attempting bed mobility pt yelled out and grabbed his LLE Pain Descriptors / Indicators: Grimacing;Guarding;Crying Pain Intervention(s): Limited activity within patient's tolerance;Monitored during session;Repositioned     Hand Dominance Right   Extremity/Trunk Assessment Upper Extremity Assessment Upper Extremity Assessment: Generalized weakness;Difficult to assess due to impaired cognition (Utilized BUE with bed mobility and pushing up in bed)           Communication Communication Communication: HOH   Cognition Arousal/Alertness: Awake/alert Behavior During Therapy: Flat affect Overall Cognitive Status: Difficult to assess                                Home Living Family/patient expects to be discharged to:: Assisted living                             Home Equipment: Walker - 2 wheels;Cane - single point;Shower seat   Additional Comments: Per SW pt is from [facility]F and brother would like pt to return there if able to provide necessary care      Prior Functioning/Environment Level of Independence: Needs assistance  Gait / Transfers Assistance Needed: Per chart pt is W/C bound. ADL's / Homemaking Assistance Needed: Pt poor historian reporting he completes ADLs and IADLs, however per chart review pt had assistance with self-care and IADLs        OT Diagnosis: Generalized weakness;Cognitive deficits;Acute pain;Blindness and low vision   OT Problem List: Decreased strength;Decreased range of motion;Decreased activity tolerance;Impaired balance (sitting and/or standing);Impaired  vision/perception;Decreased cognition;Decreased safety awareness;Pain;Decreased knowledge of use of DME or AE   OT Treatment/Interventions: Self-care/ADL training;DME and/or AE instruction;Therapeutic activities;Cognitive remediation/compensation;Patient/family education    OT Goals(Current goals can be found in the care plan section) Acute Rehab OT Goals Patient Stated Goal: pt unable to state. OT Goal Formulation: Patient unable to participate in goal setting  OT Frequency: Min 1X/week   Barriers to D/C: Decreased caregiver support  Pt from ALF, unsure if ALF able to provide necessary level of assistance          End of Session Nurse Communication: Mobility status;Other (comment) (need for soft call bell)  Activity Tolerance: Patient tolerated treatment well Patient left: in bed;with call bell/phone within reach;with bed alarm set   Time: 1336-1400 OT Time Calculation (min): 24 min Charges:  OT General Charges $OT Visit: 1 Procedure OT Evaluation $OT Eval High Complexity: 1 Procedure OT Treatments $Self Care/Home Management : 8-22 mins G-CodesSimonne Come, [phone] 08/13/2015, 2:28 PM

## 2015-08-14 DIAGNOSIS — I10 Essential (primary) hypertension: Secondary | ICD-10-CM

## 2015-08-14 DIAGNOSIS — F0390 Unspecified dementia without behavioral disturbance: Secondary | ICD-10-CM

## 2015-08-14 DIAGNOSIS — W19XXXD Unspecified fall, subsequent encounter: Secondary | ICD-10-CM

## 2015-08-14 DIAGNOSIS — N1330 Unspecified hydronephrosis: Secondary | ICD-10-CM | POA: Insufficient documentation

## 2015-08-14 DIAGNOSIS — E43 Unspecified severe protein-calorie malnutrition: Secondary | ICD-10-CM | POA: Insufficient documentation

## 2015-08-14 DIAGNOSIS — C61 Malignant neoplasm of prostate: Secondary | ICD-10-CM

## 2015-08-14 DIAGNOSIS — R630 Anorexia: Secondary | ICD-10-CM

## 2015-08-14 LAB — GLUCOSE, CAPILLARY
GLUCOSE-CAPILLARY: 101 mg/dL — AB (ref 65–99)
Glucose-Capillary: 80 mg/dL (ref 65–99)
Glucose-Capillary: 82 mg/dL (ref 65–99)
Glucose-Capillary: 87 mg/dL (ref 65–99)

## 2015-08-14 MED ORDER — CEFUROXIME AXETIL 500 MG PO TABS
500.0000 mg | ORAL_TABLET | Freq: Two times a day (BID) | ORAL | Status: DC
  Administered 2015-08-14 – 2015-08-16 (×4): 500 mg via ORAL
  Filled 2015-08-14 (×6): qty 1

## 2015-08-14 NOTE — Progress Notes (Addendum)
Late Day addendum (no charge note)  Spoke with Dr. Jeffie Pollock, Urology, who states he has seen firmagon provide good results (symptom improvement) for patients in the past.  He did caution that this is likely an aggressive tumor as the patient's PSA has risen so quickly (to over 500 in two years).  He also cautioned that the tumor has grown despite that patient having relatively low testosterone, so we would have to wait and see how effective the androgen ablation and monthly firmagon therapy would actually be.  He will be due for his next treatment in 1 month.  Patient would likely qualify for Hospice benefit based on dementia with recurrent infections and the inability to maintain fluid and calorie intake in the past 6 months as well as being wheel chair bound.    On 03/18/2015 patient weighed 265 lbs (120.203 kg).  This admission the patient weighs 180 lb (81.78 kg).  Down 85 lbs in 5 months.  Per brother Kent Mayo, the patient's son Kent Mayo. has to be involved in a DNR decision.  I was able to speak with Kent Mayo this afternoon.  I asked him about code status.  He will discuss this with his Kent Mayo and then make a decision.  We discussed the possibility of utilizing the Hospice benefit and returning to ALF.   We also discussed Urology's recommendation of continued firmagon therapy and that this may not be compatible with Hospice benefit.  Attempted to reach [employer] and left a message.   Will attempt to reach him again tomorrow.  Imogene Burn, [facility] [phone]

## 2015-08-14 NOTE — Progress Notes (Signed)
Patient Demographics  Kent Mayo, is a 82 y.o. male, DOB - 08/06/1926, [id number]  Admit date - 08/11/2015   Admitting Physician Rise Patience, MD  Outpatient Primary MD for the patient is No primary care provider on file.  LOS - 3   Chief Complaint  Patient presents with  . Fall       Admission HPI/Brief narrative: 82 y.o. male with history of dementia, hypertension, chronic anemia, chronic kidney disease and legally blind , brought to the ER for  fall at his nursing home, as well failure to thrive, workup was significant for bladder/prostate mass, with left-sided hydronephrosis ,with what appears to be metastatic disease with bone involvement, liver and adrenal glands.  Subjective:   [name] oriented to person only; afebrile and overall comfortable. No CP, no SOB and no abd pain.  Assessment & Plan    Principal Problem:   Fall at nursing home Active Problems:   HTN (hypertension)   FTT (failure to thrive) in adult   Syncope and collapse   Renal failure (ARF), acute on chronic ([place])   Fall   Palliative care encounter   Prostate cancer metastatic to multiple sites [facility])   Anorexia   Dementia   Malignancy ([place])  Fall - Most likely related to dehydration, failure to thrive, UTI and metastatic cancer. - Seen by PT, they recommended SNF. -family deciding on treatment and further invasive therapy; but leaning towards hospice care at ALF from where he came from.   Failure to thrive/ severe protein calorie malnutritionIn context of cancer, dementia and chronic illness - Patient appears to be malnourished, this is most likely related to his metastatic cancer, with known baseline dementia, will follow rec's from nutrition nutrition service regarding feeding supplementation -started on Megace  -advise to keep himself hydrated  UTI -Continue with ceftin now; day  4/8 -urine culture with multiple species  -no dysuria  -afebrile and with normal WBC's  metastatic prostate cancer  -Imaging obtained showing evidence of prostate/ bladder cancer, with presumed metastasis involving liver, adrenal glands, and bones (ribs, thoracic and lumbar spine). - Can not obtain MRI brain secondary to foreign object. -CT head neg for metastasis  - Dr. Jeffie Pollock consult appreciated, PSA> 500, Apparent T4 N1 M1 prostate cancer with left ureteral obstruction and baseline hypogonadism, Management per Urology - No indication for stent or percutaneous for left hydronephrosis at this point (given stable renal function) -recommending androgen ablation -family inquiring into percentage of cure/treatment efficacy and outcome with/without   Dementia - Continue supportive care and home medication  Hypertension - Blood pressure acceptable, continue with home medication.  chronic renal disease stage III - Baseline creatinine 1.6, today is 1.5 -will monitor intermittently   Code Status: Full  Family Communication: Spoke with brother 08/13/2014    Disposition Plan:ALF VS SNF.   Procedures  See below for x-ray reports    [facility] , Dr Jeffie Pollock.   Medications  Scheduled Meds: . atorvastatin  10 mg Oral QHS  . brimonidine  1 drop Both Eyes TID  . carvedilol  6.25 mg Oral BID WC  . cefTRIAXone (ROCEPHIN)  IV  1 g Intravenous Q24H  . donepezil  10 mg Oral BID  .  dorzolamide-timolol  1 drop Right Eye BID  . enoxaparin (LOVENOX) injection  40 mg Subcutaneous Daily  . feeding supplement (ENSURE ENLIVE)  237 mL Oral TID BM  . megestrol  400 mg Oral BID  . memantine  10 mg Oral BID  . mirtazapine  15 mg Oral QHS  . traMADol  50 mg Oral BID  . verapamil  240 mg Oral Daily   Continuous Infusions:   PRN Meds:.guaiFENesin, LORazepam, senna-docusate  DVT Prophylaxis  Lovenox   Lab Results  Component Value Date   PLT 316 08/13/2015     Antibiotics    Anti-infectives    Start     Dose/Rate Route Frequency Ordered Stop   08/12/15 0115  cefTRIAXone (ROCEPHIN) 1 g in dextrose 5 % 50 mL IVPB     1 g 100 mL/hr over 30 Minutes Intravenous Every 24 hours 08/12/15 0113            Objective:   Filed Vitals:   08/13/15 2006 08/14/15 0500 08/14/15 0848 08/14/15 1500  BP: 120/58 147/75 173/76 128/70  Pulse: 60 66  66  Temp: 98.3 F (36.8 C) 97.5 F (36.4 C)  97.6 F (36.4 C)  TempSrc: Oral Oral  Oral  Resp: 16 16  18   Height:      Weight:  81.784 kg (180 lb 4.8 oz)    SpO2: 100% 98%  100%    Wt Readings from Last 3 Encounters:  08/14/15 81.784 kg (180 lb 4.8 oz)  04/01/15 103.4 kg (227 lb 15.3 oz)  03/22/15 103.9 kg (229 lb 0.9 oz)     Intake/Output Summary (Last 24 hours) at 08/14/15 1617 Last data filed at 08/14/15 1200  Gross per 24 hour  Intake    840 ml  Output    500 ml  Net    340 ml     Physical Exam Awake, oriented to person. Legally blind. Reports appetite still poor. No CP, no nausea, no vomiting  Supple Neck, No JVD, Symmetrical Chest wall movement, Good air movement bilaterally,  RRR,No Gallops, Rubs or Murmurs +ve B.Sounds, Abd Soft, No tenderness,  No rebound - guarding or rigidity. No Cyanosis, Clubbing or edema, No new Rash or bruises     Data Review   Micro Results Recent Results (from the past 240 hour(s))  Urine culture     Status: None   Collection Time: 08/11/15 10:41 PM  Result Value Ref Range Status   Specimen Description URINE, CLEAN CATCH  Final   Special Requests NONE  Final   Culture MULTIPLE SPECIES PRESENT, SUGGEST RECOLLECTION  Final   Report Status 08/13/2015 FINAL  Final    Radiology Reports Ct Abdomen Pelvis Wo Contrast  08/11/2015  CLINICAL DATA:  Unwitnessed fall EXAM: CT ABDOMEN AND PELVIS WITHOUT CONTRAST TECHNIQUE: Multidetector CT imaging of the abdomen and pelvis was performed following the standard protocol without IV contrast. COMPARISON:   08/11/2015 FINDINGS: Lower chest: There is an at least moderate partially visualized left pleural effusion. There is a small right pleural effusion. Bilaterally there is underlying atelectasis. Hepatobiliary: Mild sludge layering dependently in the gallbladder. Gallbladder is mildly distended with no evidence of inflammation surrounding it. Liver is normal. Pancreas: Normal Spleen: Normal Adrenals/Urinary Tract: There is moderate to severe dilatation of the left ureter and moderate left hydronephrosis. Several tiny nonobstructing left renal calculi are noted. There is evidence of a large lobulated soft tissue mass involving the mid to inferior left aspect of the bladder measuring about 7  by 7 x 5.5 cm. Stomach/Bowel: Nonobstructive bowel gas pattern. Diverticulosis of the sigmoid colon. Mild to moderate fecal impaction in the rectum which is distended to a diameter of 7.5 cm. Vascular/Lymphatic: Atherosclerotic aortoiliac calcification. Soft tissue fullness in the retroperitoneum in both the. Aortic and aortocaval region. This suggests adenopathy as described on report of CT scan performed 08/11/2015. There is bulky left pelvic sidewall adenopathy. Reproductive: The mass involving the left inferior bladder could arise from either the prostate or the bladder. Musculoskeletal: Numerous sclerotic lesions throughout the thoracolumbar spine and sacrum. Sclerotic lesions are also seen in the iliac bones and sacrum. IMPRESSION: Evidence of carcinoma arising either from the prostate or the bladder, but involving both organs and causing left hydronephrosis. Evidence of metastatic adenopathy in the retroperitoneum and in the pelvis. Bilateral pleural effusions. Electronically Signed   By: Skipper Cliche M.D.   On: 08/11/2015 20:19   Dg Eye Foreign Body  08/13/2015  CLINICAL DATA:  Metal working/exposure; clearance prior to MRI EXAM: ORBITS FOR FOREIGN BODY - 2 VIEW COMPARISON:  CT 08/11/2015 FINDINGS: There is a and  radiopaque implants noted within the right orbit, seen lateral to the right globe on recent CT. Small density projects over the left orbit on the first image. On prior CT, there is a metallic foreign body noted along the anterior left globe. IMPRESSION: Radiodense implant within the right orbit. Small metallic foreign body along and possibly within the anterior globe on the left. Electronically Signed   By: Rolm Baptise M.D.   On: 08/13/2015 11:44   Dg Chest 2 View  08/06/2015  CLINICAL DATA:  Productive cough for 3 days with clear thick sputum. EXAM: CHEST  2 VIEW COMPARISON:  07/04/2015. FINDINGS: Telemetry leads overlie the chest. Normal cardiomediastinal silhouette. Bibasilar subsegmental atelectasis is suspected. No active infiltrates or failure can be observed. There is degenerative change both shoulders. IMPRESSION: Bibasilar subsegmental atelectasis is suspected. No active infiltrates or failure. Worsening aeration from priors. Electronically Signed   By: Staci Righter M.D.   On: 08/06/2015 11:21   Dg Knee 1-2 Views Left  08/12/2015  CLINICAL DATA:  Bilateral knee pain. EXAM: LEFT KNEE - 1-2 VIEW; RIGHT KNEE - 1-2 VIEW COMPARISON:  Left knee radiographs 08/11/2015 FINDINGS: Moderate tricompartmental degenerative changes bilaterally. No acute bony findings or osteochondral abnormality. No definite joint effusion. IMPRESSION: Moderate degenerative changes but no acute bony findings or joint effusion. Electronically Signed   By: Marijo Sanes M.D.   On: 08/12/2015 08:09   Dg Knee 1-2 Views Right  08/12/2015  CLINICAL DATA:  Bilateral knee pain. EXAM: LEFT KNEE - 1-2 VIEW; RIGHT KNEE - 1-2 VIEW COMPARISON:  Left knee radiographs 08/11/2015 FINDINGS: Moderate tricompartmental degenerative changes bilaterally. No acute bony findings or osteochondral abnormality. No definite joint effusion. IMPRESSION: Moderate degenerative changes but no acute bony findings or joint effusion. Electronically Signed   By: Marijo Sanes M.D.   On: 08/12/2015 08:09   Dg Tibia/fibula Left  08/11/2015  CLINICAL DATA:  Recent fall, initial encounter EXAM: LEFT TIBIA AND FIBULA - 2 VIEW COMPARISON:  None. FINDINGS: No acute fracture or dislocation is noted. There is some bony bridging between the distal tibia and distal fibula. No joint effusion is seen no soft tissue abnormalities are noted. IMPRESSION: No acute abnormality seen. Electronically Signed   By: Inez Catalina M.D.   On: 08/11/2015 17:34   Ct Head Wo Contrast  08/13/2015  CLINICAL DATA:  Prostate cancer. Altered mental status. Evaluate  for brain Mets. EXAM: CT HEAD WITHOUT CONTRAST TECHNIQUE: Contiguous axial images were obtained from the base of the skull through the vertex without intravenous contrast. COMPARISON:  None. FINDINGS: There is low attenuation throughout the subcortical and periventricular white matter compatible with chronic microvascular disease. There is prominence of the sulci and the ventricles consistent with brain atrophy. There is no abnormal extra-axial fluid collection, intracranial hemorrhage or mass. The mastoid air cells are clear. The paranasal sinuses are clear. IMPRESSION: 1. No acute intracranial abnormalities.  No evidence for brain mass. 2. Small vessel ischemic disease and brain atrophy. Electronically Signed   By: Kerby Moors M.D.   On: 08/13/2015 16:11   Ct Head Wo Contrast  08/11/2015  CLINICAL DATA:  Unwitnessed fall with headaches and neck pain, initial encounter EXAM: CT HEAD WITHOUT CONTRAST CT CERVICAL SPINE WITHOUT CONTRAST TECHNIQUE: Multidetector CT imaging of the head and cervical spine was performed following the standard protocol without intravenous contrast. Multiplanar CT image reconstructions of the cervical spine were also generated. COMPARISON:  08/06/2015 FINDINGS: CT HEAD FINDINGS Bony calvarium is intact. Postsurgical changes are noted in the right orbit. Mild atrophic changes are noted. Mild chronic white matter  ischemic change is seen. No findings to suggest acute hemorrhage, acute infarction or space-occupying mass lesion are noted. CT CERVICAL SPINE FINDINGS Seven cervical segments are well visualized. Vertebral body height is well maintained. Osteophytic changes noted from C3-C7. Facet hypertrophic changes are noted. No acute fracture or acute facet abnormality is noted. Surrounding soft tissue structures show evidence of a left-sided pleural effusion of relatively large size. IMPRESSION: CT of the head:  No acute intracranial abnormality noted. CT of the cervical spine: Degenerative changes without acute bony abnormality. Large left pleural effusion. Electronically Signed   By: Inez Catalina M.D.   On: 08/11/2015 16:53   Ct Head Wo Contrast  08/06/2015  CLINICAL DATA:  Altered mental status today.  Hypertension, UTI EXAM: CT HEAD WITHOUT CONTRAST TECHNIQUE: Contiguous axial images were obtained from the base of the skull through the vertex without intravenous contrast. COMPARISON:  07/04/2015 FINDINGS: There is atrophy and chronic small vessel disease changes. No acute intracranial abnormality. Specifically, no hemorrhage, hydrocephalus, mass lesion, acute infarction, or significant intracranial injury. No acute calvarial abnormality. IMPRESSION: No acute intracranial abnormality. Atrophy, chronic microvascular disease. Electronically Signed   By: Rolm Baptise M.D.   On: 08/06/2015 11:11   Ct Chest Wo Contrast  08/12/2015  CLINICAL DATA:  82 year old male with failure to thrive. Prostate cancer. Pleural effusion. EXAM: CT CHEST WITHOUT CONTRAST TECHNIQUE: Multidetector CT imaging of the chest was performed following the standard protocol without IV contrast. COMPARISON:  08/11/2015 and 07/03/2025 chest x-ray FINDINGS: Moderate-size left-sided pleural effusion. Consolidation left lower lobe. No obstructing lesion of the bronchi of the left lower lobe and therefore this may represent passive atelectasis or infiltrate.  Small right sided pleural effusion. Anterior right upper lobe and right middle lobe tiny nodules with suggestion of small calcifications may represent small granulomas although metastatic disease not excluded. Noncalcified left upper lobe 3 mm nodule of indeterminate etiology, metastatic disease not excluded. Mediastinal adenopathy. Index lymph nodes pretracheal region with transverse dimension of 2.6 x 1.9 cm and 2.7 x 2.4 cm. Preaortic adenopathy measures 2.3 x 1.9 cm. Retrocrural adenopathy measures 1.8 x 1.7 cm. Diffuse osseous metastatic disease. Sclerotic metastatic foci seen throughout the lower cervical spine and throughout the thoracic spine upper lumbar spine. At the T9 level, circumferential soft tissue component tumor with possibly  of epidural spread on the right. There may be epidural spread of tumor on the right at the T8 level and T6 level. Thoracic spine MR would be necessary for further delineation. Multiple metastatic rib lesions most destructive involving the posterior left sixth rib. Findings suspicious for 4.6 x 6.1 cm liver lesion although incompletely assessed on the present unenhanced CT chest. Mild enlargement of the right adrenal gland may represent hyperplasia or spread of tumor. Marked coronary artery calcifications. Heart size top-normal to slightly enlarged. 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 IMPRESSION: Moderate-size left-sided pleural effusion. Consolidation left lower lobe. No obstructing lesion of the bronchi of the left lower lobe and therefore this may represent passive atelectasis or infiltrate. Small right sided pleural effusion. Anterior right upper lobe and right middle lobe tiny nodules with suggestion of small calcifications may represent small granulomas although metastatic disease not excluded. Noncalcified left upper lobe 3 mm nodule of indeterminate etiology, metastatic disease not excluded. Mediastinal adenopathy.  Retrocrural adenopathy. Diffuse  osseous metastatic disease. Sclerotic metastatic foci seen throughout the lower cervical spine and throughout the thoracic spine upper lumbar spine. At the T9 level, circumferential soft tissue component tumor with possibly of epidural spread on the right. There may be epidural spread of tumor on the right at the T8 level and T6 level. Thoracic spine MR would be necessary for further delineation. Multiple metastatic rib lesions most destructive involving the posterior left sixth rib. Findings suspicious for 4.6 x 6.1 cm liver lesion although incompletely assessed on the present unenhanced CT chest. Mild enlargement of the right adrenal gland may represent hyperplasia or spread of tumor. Marked coronary artery calcifications. Heart size top-normal to slightly enlarged. 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 Electronically Signed   By: Genia Del M.D.   On: 08/12/2015 09:12   Ct Cervical Spine Wo Contrast  08/11/2015  CLINICAL DATA:  Unwitnessed fall with headaches and neck pain, initial encounter EXAM: CT HEAD WITHOUT CONTRAST CT CERVICAL SPINE WITHOUT CONTRAST TECHNIQUE: Multidetector CT imaging of the head and cervical spine was performed following the standard protocol without intravenous contrast. Multiplanar CT image reconstructions of the cervical spine were also generated. COMPARISON:  08/06/2015 FINDINGS: CT HEAD FINDINGS Bony calvarium is intact. Postsurgical changes are noted in the right orbit. Mild atrophic changes are noted. Mild chronic white matter ischemic change is seen. No findings to suggest acute hemorrhage, acute infarction or space-occupying mass lesion are noted. CT CERVICAL SPINE FINDINGS Seven cervical segments are well visualized. Vertebral body height is well maintained. Osteophytic changes noted from C3-C7. Facet hypertrophic changes are noted. No acute fracture or acute facet abnormality is noted. Surrounding soft tissue structures show evidence of a left-sided  pleural effusion of relatively large size. IMPRESSION: CT of the head:  No acute intracranial abnormality noted. CT of the cervical spine: Degenerative changes without acute bony abnormality. Large left pleural effusion. Electronically Signed   By: Inez Catalina M.D.   On: 08/11/2015 16:53   Ct Lumbar Spine Wo Contrast  08/11/2015  CLINICAL DATA:  Status post unwitnessed fall today. Lower extremity and low back pain. Initial encounter. EXAM: CT LUMBAR SPINE WITHOUT CONTRAST TECHNIQUE: Multidetector CT imaging of the lumbar spine was performed without intravenous contrast administration. Multiplanar CT image reconstructions were also generated. COMPARISON:  None. FINDINGS: Vertebral body height and alignment are maintained. A sclerotic lesion in the L3 vertebral body to the right measures 2.8 cm AP by 2.1 cm transverse. Several punctate sclerotic lesions  are also identified in the left ilium. Finally, a round sclerotic lesion is seen in the left side of the T11 vertebral body anterior to the pedicle measuring 1.3 cm AP by 1.1 cm transverse. Paraspinous structures demonstrate a small right and small to moderate left pleural effusion. Neither effusion is completely visualized. Bronchiectatic changes seen the right lower lobe. The patient has extensive retroperitoneal lymphadenopathy. Index paraaortic nodal mass measures approximately 3.4 x 2.9 cm on image 90 tripped. Inter-aortocaval node on image 90 measures approximately 2.1 cm short axis dimension. The patient has mild to moderate left hydronephrosis. Cause for obstruction is not discretely identified but the left ureter is not seen in its entirety. There appears to be bladder wall thickening eccentric to the left. Two small nonobstructing stones are identified the left kidney. There is scattered facet degenerative disease lumbar spine. Minimal disc bulge and endplate spurring are noted at L3-4, L4-5 and L5-S1. IMPRESSION: Negative for fracture. Findings consistent  with metastatic neoplastic process with extensive abdominal and pelvic lymphadenopathy and sclerotic bone lesions identified. There is left hydronephrosis with partial visualization of a lesion in the left side of the urinary bladder. Most likely differential considerations are prostate or bladder carcinoma. These results were called by telephone at the time of interpretation on 08/11/2015 at 5:00 pm to Dr. Esaw Grandchild , who verbally acknowledged these results. Electronically Signed   By: Inge Rise M.D.   On: 08/11/2015 17:01   Dg Chest Port 1 View  08/11/2015  CLINICAL DATA:  Recent fall EXAM: PORTABLE CHEST - 1 VIEW COMPARISON:  08/06/2015 FINDINGS: Cardiac shadow is within normal limits. The lungs are well aerated bilaterally. No focal infiltrate or sizable effusion is noted. IMPRESSION: No acute abnormality noted. Electronically Signed   By: Inez Catalina M.D.   On: 08/11/2015 17:36   Dg Hips Bilat With Pelvis 2v  08/11/2015  CLINICAL DATA:  Status post fall. EXAM: DG HIP (WITH OR WITHOUT PELVIS) 2V BILAT COMPARISON:  None. FINDINGS: There is decreased bone mineralization. There is no evidence of hip fracture or dislocation. Mild to moderate bilateral hip joint osteoarthritic changes are seen, with joint space narrowing, subchondral sclerosis of the acetabula and osteophyte formation. IMPRESSION: No evidence of fracture or subluxation of bilateral hips. Electronically Signed   By: Fidela Salisbury M.D.   On: 08/11/2015 17:39     CBC  Recent Labs Lab 08/11/15 1716 08/12/15 0506 08/13/15 0350  WBC 15.4* 12.4* 9.7  HGB 12.0* 10.9* 11.8*  HCT 35.9* 33.2* 36.1*  PLT 398 357 316  MCV 86.3 87.4 88.9  MCH 28.8 28.7 29.1  MCHC 33.4 32.8 32.7  RDW 16.2* 16.5* 17.1*  LYMPHSABS 2.0  --   --   MONOABS 1.5*  --   --   EOSABS 0.1  --   --   BASOSABS 0.0  --   --     Chemistries   Recent Labs Lab 08/11/15 1716 08/12/15 0506 08/13/15 0350  NA 142 142 143  K 4.9 4.0 3.8  CL 107 109  108  CO2 23 24 23   GLUCOSE 89 82 91  BUN 27* 26* 24*  CREATININE 1.84* 1.69* 1.55*  CALCIUM 9.7 9.1 9.2  AST  --  29  --   ALT  --  7*  --   ALKPHOS  --  270*  --   BILITOT  --  0.5  --     Cardiac Enzymes  Recent Labs Lab 08/11/15 2254 08/12/15 0506 08/12/15 1115  TROPONINI 0.03 0.04* 0.04*   ------------------------------------------------------------------------------------------------------------------ Invalid input(s): POCBNP     Time Spent in minutes   25 minutes   Barton Dubois M.D on 08/14/2015 at 4:17 PM  Between 7am to 7pm - Pager - [phone]  After 7pm go to [email or web address] - password [email or web address]  [facility]   Office  [phone]

## 2015-08-14 NOTE — Clinical Social Work Note (Signed)
CSW received call from [facility]. Administator Mia Selena Batten has approved patient to return to ALF with hospice services when ready for DC.   Liz Beach MSW, [place], [place], [id number]

## 2015-08-14 NOTE — Clinical Social Work Note (Signed)
Message left with son requesting call back regarding [facility] and discharge planning. Palliative medicine office number and CSW contact information left. CSW received call back from [facility] stating that they "mispoke" when they committed to taking the patient back with hospice services and that they will need to come evaluate the patient this afternoon. CSW contact [facility] in regards to firmagon and whether the patient can receive this under home hospice services. [facility] states that this medication can only be given under palliative services not hospice services.   Liz Beach MSW, [place], [place], [id number]

## 2015-08-14 NOTE — Progress Notes (Addendum)
Daily Progress Note   Patient Name: Kent Mayo       Date: 08/14/2015 DOB: October 29, 1926  Age: 82 y.o. MRN#: [id number] Attending Physician: Barton Dubois, MD Primary Care Physician: No primary care provider on file. Admit Date: 08/11/2015  Reason for Consultation/Follow-up: Disposition and Establishing goals of care  Subjective: No complaints, No worries.  Denies pain.  Reports he is not hungry.  Interval Events:  Spoke with Brother Rolan Bucco on 1/10 who wants the patient to return to ALF with hospice if eligible.  ALF is willing to accept patient back with Hospice services when medically appropriate.  Unsuccessfully attempted to reach Son, Saamir, twice on 1/10 and again today to confirm DNR.  Need to determine if urology recommendations ([place] monthly) would be allowed under Hospice benefit and if patient/HCPOA wants to receive them.  Patient appears stable and comfortable.    Length of Stay: 3 days  Current Medications: Scheduled Meds:  . atorvastatin  10 mg Oral QHS  . brimonidine  1 drop Both Eyes TID  . carvedilol  6.25 mg Oral BID WC  . cefTRIAXone (ROCEPHIN)  IV  1 g Intravenous Q24H  . donepezil  10 mg Oral BID  . dorzolamide-timolol  1 drop Right Eye BID  . enoxaparin (LOVENOX) injection  40 mg Subcutaneous Daily  . feeding supplement (ENSURE ENLIVE)  237 mL Oral TID BM  . megestrol  400 mg Oral BID  . memantine  10 mg Oral BID  . mirtazapine  15 mg Oral QHS  . traMADol  50 mg Oral BID  . verapamil  240 mg Oral Daily    Continuous Infusions:    PRN Meds: guaiFENesin, LORazepam, senna-docusate  Physical Exam: Physical Exam             Awake, Appropriate, Well developed male, HOH (with hearing aids) and blind Chest:  No increased work of breathing CV:   No frank M/R/G Abdomen:  Soft, thin, Nt, nd   Vital Signs: BP 173/76 mmHg  Pulse 66  Temp(Src) 97.5 F (36.4 C) (Oral)  Resp 16  Ht 6\' 2"  (1.88 m)  Wt 81.784 kg (180 lb 4.8 oz)  BMI 23.14 kg/m2  SpO2 98% SpO2: SpO2: 98 % O2 Device: O2 Device: Not Delivered O2 Flow Rate:    Intake/output summary:  Intake/Output  Summary (Last 24 hours) at 08/14/15 1313 Last data filed at 08/14/15 1200  Gross per 24 hour  Intake 1267.5 ml  Output    500 ml  Net  767.5 ml   LBM: Last BM Date: 08/11/15 Baseline Weight: Weight: 102.967 kg (227 lb) Most recent weight: Weight: 81.784 kg (180 lb 4.8 oz)       Palliative Assessment/Data: Flowsheet Rows        Most Recent Value   Intake Tab    Referral Department  Hospitalist   Unit at Time of Referral  Cardiac/Telemetry Unit   Palliative Care Primary Diagnosis  Cancer   Date Notified  08/11/15   Palliative Care Type  New Palliative care   Reason for referral  Clarify Goals of Care   Date of Admission  08/11/15   Date first seen by Palliative Care  08/12/15   # of days Palliative referral response time  1 Day(s)   # of days IP prior to Palliative referral  0   Clinical Assessment    Palliative Performance Scale Score  30%   Pain Max last 24 hours  3   Pain Min Last 24 hours  1   Anxiety Max Last 24 Hours  5   Anxiety Min Last 24 Hours  1   Psychosocial & Spiritual Assessment    Social Work Plan of [place] patient/family wishes with healthcare team, Education on [place]    Patient/Family meeting held?  No   Palliative Care Outcomes  Counseled regarding hospice, Clarified goals of care   Palliative Care follow-up planned  Yes, Facility      Additional Data Reviewed: CBC    Component Value Date/Time   WBC 9.7 08/13/2015 0350   RBC 4.06* 08/13/2015 0350   HGB 11.8* 08/13/2015 0350   HCT 36.1* 08/13/2015 0350   PLT 316 08/13/2015 0350   MCV 88.9 08/13/2015 0350   MCH 29.1 08/13/2015 0350   MCHC  32.7 08/13/2015 0350   RDW 17.1* 08/13/2015 0350   LYMPHSABS 2.0 08/11/2015 1716   MONOABS 1.5* 08/11/2015 1716   EOSABS 0.1 08/11/2015 1716   BASOSABS 0.0 08/11/2015 1716    CMP     Component Value Date/Time   NA 143 08/13/2015 0350   K 3.8 08/13/2015 0350   CL 108 08/13/2015 0350   CO2 23 08/13/2015 0350   GLUCOSE 91 08/13/2015 0350   BUN 24* 08/13/2015 0350   CREATININE 1.55* 08/13/2015 0350   CALCIUM 9.2 08/13/2015 0350   PROT 7.2 08/12/2015 0506   ALBUMIN 2.6* 08/12/2015 0506   AST 29 08/12/2015 0506   ALT 7* 08/12/2015 0506   ALKPHOS 270* 08/12/2015 0506   BILITOT 0.5 08/12/2015 0506   GFRNONAA 39* 08/13/2015 0350   GFRAA 45* 08/13/2015 0350       Problem List:  Patient Active Problem List   Diagnosis Date Noted  . Prostate cancer metastatic to multiple sites [facility]) 08/13/2015    Priority: High  . Anorexia 08/13/2015    Priority: High  . Palliative care encounter 08/13/2015  . Dementia 08/13/2015  . Malignancy ([place])   . Syncope and collapse 08/11/2015  . Fall at nursing home 08/11/2015  . Renal failure (ARF), acute on chronic (HCC) 08/11/2015  . Fall 08/11/2015  . UTI (urinary tract infection) 03/30/2015  . Acute renal failure ([place]) 03/29/2015  . Hyperammonemia ([place])   . Urinary retention 03/20/2015  . Acute encephalopathy 03/20/2015  . AKI (acute kidney  injury) ([place])   . Leukocytosis   . CN (constipation)   . ARF (acute renal failure) ([place]) 03/18/2015  . History of CVA (cerebrovascular accident) 03/18/2015  . FTT (failure to thrive) in adult 03/18/2015  . CVA (cerebral vascular accident) ([place]) 04/26/2013  . 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 Dizziness and giddiness 02/23/2013  . HTN (hypertension) 02/23/2013  . Diabetes mellitus ([place]) 02/23/2013  . Hyperlipidemia 02/23/2013     Palliative Care Assessment & Plan    1.Code Status:  Full code    Code Status Orders        Start     Ordered   08/11/15 2228  Full code   Continuous       08/11/15 2229    Code Status History    Date Active Date Inactive Code Status Order ID Comments User Context   03/29/2015  5:45 PM 04/05/2015  3:26 PM Full Code [id number]  Debbe Odea, MD ED   03/18/2015  8:53 PM 03/22/2015 10:40 PM Full Code [id number]  Allyne Gee, MD Inpatient   02/23/2013  1:28 AM 02/28/2013  4:13 PM Full Code [id number]  Rise Patience, MD Inpatient       2. Goals of Care/Additional Recommendations:  Need to confirm DNR with son.  Limitations on Scope of Treatment:   Desire for further Chaplaincy support:No  Psycho-social Needs: Caregiving  Support/Resources  3. Symptom Management:      Patient is comfortable with PRN tramadol.  4. Palliative Prophylaxis:   Bowel Regimen, Frequent Pain Assessment and Turn Reposition  5.  Prognosis: In the setting of dementia and metastatic prostatic cancer, and anorexia, Mr. Loschiavo is at high risk for an acute event. He very likely has less than 6 months to live and could die much faster if an acute event were to occur.   6. Discharge Planning:  Likely discharge to ALF with Hospice Services.   Care plan was discussed with Dr. Barton Dubois of [facility]  Thank you for allowing the Palliative Medicine Team to assist in the care of this patient.   Time In: 1300 Time Out: 1325 Total Time 25 min Prolonged Time Billed NO        Melton Alar, PA-C  08/14/2015, 1:13 PM  Please contact Palliative Medicine Team phone at [phone] for questions and concerns.   Addendum: Discussed with Ms. Allean Found. Recommend family meeting with palliative provider, patient's son and brother. Need to discuss code status and other goals of care before deciding on hospice services in my opinion. Patient maybe eligible for hospice based on his dementia stage.

## 2015-08-14 NOTE — Progress Notes (Signed)
Patient ID: Kent Mayo, male   DOB: 10-30-1926, 82 y.o.   MRN: [id number]  I was initially under the impression that he had a foley catheter and not a condom catheter.   I was notified of my error by his nurse and also informed of the 79ml PVR.   I have updated my initial note to reflect this.

## 2015-08-14 NOTE — Clinical Social Work Note (Signed)
Meeting held with patient, patient's brother Rolan Bucco, and facility RN Dazha. CSW explained that if ALF is unable to accept the patient with hospice services at discharge then the family will need to be prepared for the patient to go to SNF with palliative services if Medicare is to be utilized or private pay at [facility] if hospice services is chosen. Dahza unable to give CSW decision at this time and states she will need to talk with her director regarding her evaluation of the patient. Rolan Bucco states that he will pay privately for the patient to have any needed assistance at ALF. CSW will leave report for covering CSW.   Liz Beach MSW, [place], [place], [id number]

## 2015-08-14 NOTE — Care Management Important Message (Signed)
Important Message  Patient Details  Name: Kent Mayo MRN: [id number] Date of Birth: 21-Mar-1927   Medicare Important Message Given:  Yes    Nathen May 08/14/2015, 12:19 PM

## 2015-08-15 DIAGNOSIS — F0391 Unspecified dementia with behavioral disturbance: Secondary | ICD-10-CM

## 2015-08-15 DIAGNOSIS — R627 Adult failure to thrive: Secondary | ICD-10-CM

## 2015-08-15 DIAGNOSIS — N1339 Other hydronephrosis: Secondary | ICD-10-CM

## 2015-08-15 DIAGNOSIS — W19XXXA Unspecified fall, initial encounter: Secondary | ICD-10-CM

## 2015-08-15 DIAGNOSIS — N183 Chronic kidney disease, stage 3 (moderate): Secondary | ICD-10-CM

## 2015-08-15 LAB — GLUCOSE, CAPILLARY
Glucose-Capillary: 72 mg/dL (ref 65–99)
Glucose-Capillary: 106 mg/dL — ABNORMAL HIGH (ref 65–99)

## 2015-08-15 MED ORDER — LIDOCAINE 5 % EX PTCH
1.0000 | MEDICATED_PATCH | CUTANEOUS | Status: DC
  Filled 2015-08-15: qty 1

## 2015-08-15 MED ORDER — OXYCODONE HCL 5 MG PO TABS: 5.0000 mg | ORAL_TABLET | ORAL | Status: DC | PRN

## 2015-08-15 NOTE — Progress Notes (Signed)
Patient Demographics  Kent Mayo, is a 82 y.o. male, DOB - August 23, 1926, [id number]  Admit date - 08/11/2015   Admitting Physician Rise Patience, MD  Outpatient Primary MD for the patient is No primary care provider on file.  LOS - 4   Chief Complaint  Patient presents with  . Fall       Admission HPI/Brief narrative: 82 y.o. male with history of dementia, hypertension, chronic anemia, chronic kidney disease and legally blind , brought to the ER for  fall at his nursing home, as well failure to thrive, workup was significant for bladder/prostate mass, with left-sided hydronephrosis ,with what appears to be metastatic disease with bone involvement, liver and adrenal glands.  Subjective:   Kent Mayo oriented to person only; afebrile and overall comfortable. No CP, no SOB and no abd pain. Appears to have pain with movement.  Assessment & Plan    Principal Problem:   Fall at nursing home Active Problems:   HTN (hypertension)   FTT (failure to thrive) in adult   Syncope and collapse   Renal failure (ARF), acute on chronic ([place])   Fall   Palliative care encounter   Prostate cancer metastatic to multiple sites ([place])   Anorexia   Dementia   Malignancy ([place])   Hydronephrosis   Severe protein-calorie malnutrition ([place])  Fall - Most likely related to dehydration, failure to thrive, UTI and metastatic cancer. - Seen by PT, they recommended SNF. -family deciding on treatment and further invasive therapy; but leaning towards hospice care at ALF from where he came from.   Failure to thrive/ severe protein calorie malnutritionIn context of cancer, dementia and chronic illness - Patient appears to be malnourished, this is most likely related to his metastatic cancer, with known baseline dementia, will follow rec's from nutrition nutrition service regarding feeding  supplementation -started on Megace  -advise to keep himself well hydrated  UTI -Continue with ceftin now; day 5/8 -urine culture with multiple species  -no dysuria  -afebrile and with normal WBC's  metastatic prostate cancer  -Imaging obtained showing evidence of prostate/ bladder cancer, with presumed metastasis involving liver, adrenal glands, and bones (ribs, thoracic and lumbar spine). - Can not obtain MRI brain secondary to foreign object. -CT head neg for metastasis  - Dr. Jeffie Pollock consult appreciated, PSA> 500, Apparent T4 N1 M1 prostate cancer with left ureteral obstruction and baseline hypogonadism, Management per Urology - No indication for stent or percutaneous drainage for left hydronephrosis at this point (given stable renal function) -urology recommending androgen ablation -family inquiring into percentage of cure/treatment efficacy and outcome with and without treatment -prognosis is poor overall, especially given degree of malnutrition, underlying dementia and lack of proper care/support   Dementia - Continue supportive care and home medication  Hypertension - Blood pressure acceptable, continue with home antihypertensive medication.  chronic renal disease stage III - Baseline creatinine 1.6, today is 1.5 -will monitor intermittently   Code Status: Full  Family Communication: Spoke with brother 08/13/2014    Disposition Plan: ALF VS SNF. Waiting final decision from family.   Procedures  See below for x-ray reports    [facility] , Dr Jeffie Pollock.   Medications  Scheduled Meds: . atorvastatin  10 mg Oral QHS  . brimonidine  1 drop Both Eyes TID  . carvedilol  6.25 mg Oral BID WC  . cefUROXime  500 mg Oral BID WC  . donepezil  10 mg Oral BID  . dorzolamide-timolol  1 drop Right Eye BID  . enoxaparin (LOVENOX) injection  40 mg Subcutaneous Daily  . feeding supplement (ENSURE ENLIVE)  237 mL Oral TID BM  . megestrol  400 mg Oral  BID  . memantine  10 mg Oral BID  . mirtazapine  15 mg Oral QHS  . verapamil  240 mg Oral Daily   Continuous Infusions:   PRN Meds:.guaiFENesin, LORazepam, oxyCODONE, senna-docusate  DVT Prophylaxis  Lovenox   Lab Results  Component Value Date   PLT 316 08/13/2015    Antibiotics    Anti-infectives    Start     Dose/Rate Route Frequency Ordered Stop   08/14/15 1700  cefUROXime (CEFTIN) tablet 500 mg     500 mg Oral 2 times daily with meals 08/14/15 1622     08/12/15 0115  cefTRIAXone (ROCEPHIN) 1 g in dextrose 5 % 50 mL IVPB  Status:  Discontinued     1 g 100 mL/hr over 30 Minutes Intravenous Every 24 hours 08/12/15 0113 08/14/15 1622          Objective:   Filed Vitals:   08/14/15 1500 08/14/15 2105 08/15/15 0612 08/15/15 1440  BP: 128/70 129/60 141/65 134/63  Pulse: 66 61 71 71  Temp: 97.6 F (36.4 C) 97.5 F (36.4 C) 98.1 F (36.7 C) 98.3 F (36.8 C)  TempSrc: Oral Oral Oral Oral  Resp: 18 18 18 18   Height:      Weight:      SpO2: 100% 100% 100% 100%    Wt Readings from Last 3 Encounters:  08/14/15 81.784 kg (180 lb 4.8 oz)  04/01/15 103.4 kg (227 lb 15.3 oz)  03/22/15 103.9 kg (229 lb 0.9 oz)     Intake/Output Summary (Last 24 hours) at 08/15/15 1850 Last data filed at 08/15/15 1440  Gross per 24 hour  Intake    480 ml  Output    400 ml  Net     80 ml     Physical Exam Awake, oriented to person only. Legally blind. Reports appetite still poor. No CP, no nausea, no vomiting. Overall appears comfortable, but when movement is attempted patient with grimacing and appears to be in pain.  Supple Neck, No JVD, Symmetrical Chest wall movement, Good air movement bilaterally, no wheezing  RRR,No Gallops, Rubs or Murmurs +ve B.Sounds, Abd Soft, No tenderness,  No rebound - guarding or rigidity. No Cyanosis, Clubbing or edema, No new Rash or bruises     Data Review   Micro Results Recent Results (from the past 240 hour(s))  Urine culture     Status:  None   Collection Time: 08/11/15 10:41 PM  Result Value Ref Range Status   Specimen Description URINE, CLEAN CATCH  Final   Special Requests NONE  Final   Culture MULTIPLE SPECIES PRESENT, SUGGEST RECOLLECTION  Final   Report Status 08/13/2015 FINAL  Final    Radiology Reports Ct Abdomen Pelvis Wo Contrast  08/11/2015  CLINICAL DATA:  Unwitnessed fall EXAM: CT ABDOMEN AND PELVIS WITHOUT CONTRAST TECHNIQUE: Multidetector CT imaging of the abdomen and pelvis was performed following the standard protocol without IV contrast. COMPARISON:  08/11/2015 FINDINGS: Lower chest: There is an at least moderate partially visualized left pleural effusion. There is  a small right pleural effusion. Bilaterally there is underlying atelectasis. Hepatobiliary: Mild sludge layering dependently in the gallbladder. Gallbladder is mildly distended with no evidence of inflammation surrounding it. Liver is normal. Pancreas: Normal Spleen: Normal Adrenals/Urinary Tract: There is moderate to severe dilatation of the left ureter and moderate left hydronephrosis. Several tiny nonobstructing left renal calculi are noted. There is evidence of a large lobulated soft tissue mass involving the mid to inferior left aspect of the bladder measuring about 7 by 7 x 5.5 cm. Stomach/Bowel: Nonobstructive bowel gas pattern. Diverticulosis of the sigmoid colon. Mild to moderate fecal impaction in the rectum which is distended to a diameter of 7.5 cm. Vascular/Lymphatic: Atherosclerotic aortoiliac calcification. Soft tissue fullness in the retroperitoneum in both the. Aortic and aortocaval region. This suggests adenopathy as described on report of CT scan performed 08/11/2015. There is bulky left pelvic sidewall adenopathy. Reproductive: The mass involving the left inferior bladder could arise from either the prostate or the bladder. Musculoskeletal: Numerous sclerotic lesions throughout the thoracolumbar spine and sacrum. Sclerotic lesions are also  seen in the iliac bones and sacrum. IMPRESSION: Evidence of carcinoma arising either from the prostate or the bladder, but involving both organs and causing left hydronephrosis. Evidence of metastatic adenopathy in the retroperitoneum and in the pelvis. Bilateral pleural effusions. Electronically Signed   By: Skipper Cliche M.D.   On: 08/11/2015 20:19   Dg Eye Foreign Body  08/13/2015  CLINICAL DATA:  Metal working/exposure; clearance prior to MRI EXAM: ORBITS FOR FOREIGN BODY - 2 VIEW COMPARISON:  CT 08/11/2015 FINDINGS: There is a and radiopaque implants noted within the right orbit, seen lateral to the right globe on recent CT. Small density projects over the left orbit on the first image. On prior CT, there is a metallic foreign body noted along the anterior left globe. IMPRESSION: Radiodense implant within the right orbit. Small metallic foreign body along and possibly within the anterior globe on the left. Electronically Signed   By: Rolm Baptise M.D.   On: 08/13/2015 11:44   Dg Chest 2 View  08/06/2015  CLINICAL DATA:  Productive cough for 3 days with clear thick sputum. EXAM: CHEST  2 VIEW COMPARISON:  07/04/2015. FINDINGS: Telemetry leads overlie the chest. Normal cardiomediastinal silhouette. Bibasilar subsegmental atelectasis is suspected. No active infiltrates or failure can be observed. There is degenerative change both shoulders. IMPRESSION: Bibasilar subsegmental atelectasis is suspected. No active infiltrates or failure. Worsening aeration from priors. Electronically Signed   By: Staci Righter M.D.   On: 08/06/2015 11:21   Dg Knee 1-2 Views Left  08/12/2015  CLINICAL DATA:  Bilateral knee pain. EXAM: LEFT KNEE - 1-2 VIEW; RIGHT KNEE - 1-2 VIEW COMPARISON:  Left knee radiographs 08/11/2015 FINDINGS: Moderate tricompartmental degenerative changes bilaterally. No acute bony findings or osteochondral abnormality. No definite joint effusion. IMPRESSION: Moderate degenerative changes but no acute  bony findings or joint effusion. Electronically Signed   By: Marijo Sanes M.D.   On: 08/12/2015 08:09   Dg Knee 1-2 Views Right  08/12/2015  CLINICAL DATA:  Bilateral knee pain. EXAM: LEFT KNEE - 1-2 VIEW; RIGHT KNEE - 1-2 VIEW COMPARISON:  Left knee radiographs 08/11/2015 FINDINGS: Moderate tricompartmental degenerative changes bilaterally. No acute bony findings or osteochondral abnormality. No definite joint effusion. IMPRESSION: Moderate degenerative changes but no acute bony findings or joint effusion. Electronically Signed   By: Marijo Sanes M.D.   On: 08/12/2015 08:09   Dg Tibia/fibula Left  08/11/2015  CLINICAL DATA:  Recent fall, initial encounter EXAM: LEFT TIBIA AND FIBULA - 2 VIEW COMPARISON:  None. FINDINGS: No acute fracture or dislocation is noted. There is some bony bridging between the distal tibia and distal fibula. No joint effusion is seen no soft tissue abnormalities are noted. IMPRESSION: No acute abnormality seen. Electronically Signed   By: Inez Catalina M.D.   On: 08/11/2015 17:34   Ct Head Wo Contrast  08/13/2015  CLINICAL DATA:  Prostate cancer. Altered mental status. Evaluate for brain Mets. EXAM: CT HEAD WITHOUT CONTRAST TECHNIQUE: Contiguous axial images were obtained from the base of the skull through the vertex without intravenous contrast. COMPARISON:  None. FINDINGS: There is low attenuation throughout the subcortical and periventricular white matter compatible with chronic microvascular disease. There is prominence of the sulci and the ventricles consistent with brain atrophy. There is no abnormal extra-axial fluid collection, intracranial hemorrhage or mass. The mastoid air cells are clear. The paranasal sinuses are clear. IMPRESSION: 1. No acute intracranial abnormalities.  No evidence for brain mass. 2. Small vessel ischemic disease and brain atrophy. Electronically Signed   By: Kerby Moors M.D.   On: 08/13/2015 16:11   Ct Head Wo Contrast  08/11/2015  CLINICAL  DATA:  Unwitnessed fall with headaches and neck pain, initial encounter EXAM: CT HEAD WITHOUT CONTRAST CT CERVICAL SPINE WITHOUT CONTRAST TECHNIQUE: Multidetector CT imaging of the head and cervical spine was performed following the standard protocol without intravenous contrast. Multiplanar CT image reconstructions of the cervical spine were also generated. COMPARISON:  08/06/2015 FINDINGS: CT HEAD FINDINGS Bony calvarium is intact. Postsurgical changes are noted in the right orbit. Mild atrophic changes are noted. Mild chronic white matter ischemic change is seen. No findings to suggest acute hemorrhage, acute infarction or space-occupying mass lesion are noted. CT CERVICAL SPINE FINDINGS Seven cervical segments are well visualized. Vertebral body height is well maintained. Osteophytic changes noted from C3-C7. Facet hypertrophic changes are noted. No acute fracture or acute facet abnormality is noted. Surrounding soft tissue structures show evidence of a left-sided pleural effusion of relatively large size. IMPRESSION: CT of the head:  No acute intracranial abnormality noted. CT of the cervical spine: Degenerative changes without acute bony abnormality. Large left pleural effusion. Electronically Signed   By: Inez Catalina M.D.   On: 08/11/2015 16:53   Ct Head Wo Contrast  08/06/2015  CLINICAL DATA:  Altered mental status today.  Hypertension, UTI EXAM: CT HEAD WITHOUT CONTRAST TECHNIQUE: Contiguous axial images were obtained from the base of the skull through the vertex without intravenous contrast. COMPARISON:  07/04/2015 FINDINGS: There is atrophy and chronic small vessel disease changes. No acute intracranial abnormality. Specifically, no hemorrhage, hydrocephalus, mass lesion, acute infarction, or significant intracranial injury. No acute calvarial abnormality. IMPRESSION: No acute intracranial abnormality. Atrophy, chronic microvascular disease. Electronically Signed   By: Rolm Baptise M.D.   On:  08/06/2015 11:11   Ct Chest Wo Contrast  08/12/2015  CLINICAL DATA:  82 year old male with failure to thrive. Prostate cancer. Pleural effusion. EXAM: CT CHEST WITHOUT CONTRAST TECHNIQUE: Multidetector CT imaging of the chest was performed following the standard protocol without IV contrast. COMPARISON:  08/11/2015 and 07/03/2025 chest x-ray FINDINGS: Moderate-size left-sided pleural effusion. Consolidation left lower lobe. No obstructing lesion of the bronchi of the left lower lobe and therefore this may represent passive atelectasis or infiltrate. Small right sided pleural effusion. Anterior right upper lobe and right middle lobe tiny nodules with suggestion of small calcifications may represent small granulomas although metastatic  disease not excluded. Noncalcified left upper lobe 3 mm nodule of indeterminate etiology, metastatic disease not excluded. Mediastinal adenopathy. Index lymph nodes pretracheal region with transverse dimension of 2.6 x 1.9 cm and 2.7 x 2.4 cm. Preaortic adenopathy measures 2.3 x 1.9 cm. Retrocrural adenopathy measures 1.8 x 1.7 cm. Diffuse osseous metastatic disease. Sclerotic metastatic foci seen throughout the lower cervical spine and throughout the thoracic spine upper lumbar spine. At the T9 level, circumferential soft tissue component tumor with possibly of epidural spread on the right. There may be epidural spread of tumor on the right at the T8 level and T6 level. Thoracic spine MR would be necessary for further delineation. Multiple metastatic rib lesions most destructive involving the posterior left sixth rib. Findings suspicious for 4.6 x 6.1 cm liver lesion although incompletely assessed on the present unenhanced CT chest. Mild enlargement of the right adrenal gland may represent hyperplasia or spread of tumor. Marked coronary artery calcifications. Heart size top-normal to slightly enlarged. 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 IMPRESSION: Moderate-size  left-sided pleural effusion. Consolidation left lower lobe. No obstructing lesion of the bronchi of the left lower lobe and therefore this may represent passive atelectasis or infiltrate. Small right sided pleural effusion. Anterior right upper lobe and right middle lobe tiny nodules with suggestion of small calcifications may represent small granulomas although metastatic disease not excluded. Noncalcified left upper lobe 3 mm nodule of indeterminate etiology, metastatic disease not excluded. Mediastinal adenopathy.  Retrocrural adenopathy. Diffuse osseous metastatic disease. Sclerotic metastatic foci seen throughout the lower cervical spine and throughout the thoracic spine upper lumbar spine. At the T9 level, circumferential soft tissue component tumor with possibly of epidural spread on the right. There may be epidural spread of tumor on the right at the T8 level and T6 level. Thoracic spine MR would be necessary for further delineation. Multiple metastatic rib lesions most destructive involving the posterior left sixth rib. Findings suspicious for 4.6 x 6.1 cm liver lesion although incompletely assessed on the present unenhanced CT chest. Mild enlargement of the right adrenal gland may represent hyperplasia or spread of tumor. Marked coronary artery calcifications. Heart size top-normal to slightly enlarged. 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 Electronically Signed   By: Genia Del M.D.   On: 08/12/2015 09:12   Ct Cervical Spine Wo Contrast  08/11/2015  CLINICAL DATA:  Unwitnessed fall with headaches and neck pain, initial encounter EXAM: CT HEAD WITHOUT CONTRAST CT CERVICAL SPINE WITHOUT CONTRAST TECHNIQUE: Multidetector CT imaging of the head and cervical spine was performed following the standard protocol without intravenous contrast. Multiplanar CT image reconstructions of the cervical spine were also generated. COMPARISON:  08/06/2015 FINDINGS: CT HEAD FINDINGS Bony calvarium is  intact. Postsurgical changes are noted in the right orbit. Mild atrophic changes are noted. Mild chronic white matter ischemic change is seen. No findings to suggest acute hemorrhage, acute infarction or space-occupying mass lesion are noted. CT CERVICAL SPINE FINDINGS Seven cervical segments are well visualized. Vertebral body height is well maintained. Osteophytic changes noted from C3-C7. Facet hypertrophic changes are noted. No acute fracture or acute facet abnormality is noted. Surrounding soft tissue structures show evidence of a left-sided pleural effusion of relatively large size. IMPRESSION: CT of the head:  No acute intracranial abnormality noted. CT of the cervical spine: Degenerative changes without acute bony abnormality. Large left pleural effusion. Electronically Signed   By: Inez Catalina M.D.   On: 08/11/2015 16:53   Ct Lumbar  Spine Wo Contrast  08/11/2015  CLINICAL DATA:  Status post unwitnessed fall today. Lower extremity and low back pain. Initial encounter. EXAM: CT LUMBAR SPINE WITHOUT CONTRAST TECHNIQUE: Multidetector CT imaging of the lumbar spine was performed without intravenous contrast administration. Multiplanar CT image reconstructions were also generated. COMPARISON:  None. FINDINGS: Vertebral body height and alignment are maintained. A sclerotic lesion in the L3 vertebral body to the right measures 2.8 cm AP by 2.1 cm transverse. Several punctate sclerotic lesions are also identified in the left ilium. Finally, a round sclerotic lesion is seen in the left side of the T11 vertebral body anterior to the pedicle measuring 1.3 cm AP by 1.1 cm transverse. Paraspinous structures demonstrate a small right and small to moderate left pleural effusion. Neither effusion is completely visualized. Bronchiectatic changes seen the right lower lobe. The patient has extensive retroperitoneal lymphadenopathy. Index paraaortic nodal mass measures approximately 3.4 x 2.9 cm on image 90 tripped.  Inter-aortocaval node on image 90 measures approximately 2.1 cm short axis dimension. The patient has mild to moderate left hydronephrosis. Cause for obstruction is not discretely identified but the left ureter is not seen in its entirety. There appears to be bladder wall thickening eccentric to the left. Two small nonobstructing stones are identified the left kidney. There is scattered facet degenerative disease lumbar spine. Minimal disc bulge and endplate spurring are noted at L3-4, L4-5 and L5-S1. IMPRESSION: Negative for fracture. Findings consistent with metastatic neoplastic process with extensive abdominal and pelvic lymphadenopathy and sclerotic bone lesions identified. There is left hydronephrosis with partial visualization of a lesion in the left side of the urinary bladder. Most likely differential considerations are prostate or bladder carcinoma. These results were called by telephone at the time of interpretation on 08/11/2015 at 5:00 pm to Dr. Esaw Grandchild , who verbally acknowledged these results. Electronically Signed   By: Inge Rise M.D.   On: 08/11/2015 17:01   Dg Chest Port 1 View  08/11/2015  CLINICAL DATA:  Recent fall EXAM: PORTABLE CHEST - 1 VIEW COMPARISON:  08/06/2015 FINDINGS: Cardiac shadow is within normal limits. The lungs are well aerated bilaterally. No focal infiltrate or sizable effusion is noted. IMPRESSION: No acute abnormality noted. Electronically Signed   By: Inez Catalina M.D.   On: 08/11/2015 17:36   Dg Hips Bilat With Pelvis 2v  08/11/2015  CLINICAL DATA:  Status post fall. EXAM: DG HIP (WITH OR WITHOUT PELVIS) 2V BILAT COMPARISON:  None. FINDINGS: There is decreased bone mineralization. There is no evidence of hip fracture or dislocation. Mild to moderate bilateral hip joint osteoarthritic changes are seen, with joint space narrowing, subchondral sclerosis of the acetabula and osteophyte formation. IMPRESSION: No evidence of fracture or subluxation of bilateral  hips. Electronically Signed   By: Fidela Salisbury M.D.   On: 08/11/2015 17:39     CBC  Recent Labs Lab 08/11/15 1716 08/12/15 0506 08/13/15 0350  WBC 15.4* 12.4* 9.7  HGB 12.0* 10.9* 11.8*  HCT 35.9* 33.2* 36.1*  PLT 398 357 316  MCV 86.3 87.4 88.9  MCH 28.8 28.7 29.1  MCHC 33.4 32.8 32.7  RDW 16.2* 16.5* 17.1*  LYMPHSABS 2.0  --   --   MONOABS 1.5*  --   --   EOSABS 0.1  --   --   BASOSABS 0.0  --   --     Chemistries   Recent Labs Lab 08/11/15 1716 08/12/15 0506 08/13/15 0350  NA 142 142 143  K 4.9 4.0  3.8  CL 107 109 108  CO2 23 24 23   GLUCOSE 89 82 91  BUN 27* 26* 24*  CREATININE 1.84* 1.69* 1.55*  CALCIUM 9.7 9.1 9.2  AST  --  29  --   ALT  --  7*  --   ALKPHOS  --  270*  --   BILITOT  --  0.5  --     Cardiac Enzymes  Recent Labs Lab 08/11/15 2254 08/12/15 0506 08/12/15 1115  TROPONINI 0.03 0.04* 0.04*   ------------------------------------------------------------------------------------------------------------------ Invalid input(s): POCBNP     Time Spent in minutes   25 minutes   Barton Dubois M.D on 08/15/2015 at 6:50 PM  Between 7am to 7pm - Pager - [phone]  After 7pm go to [email or web address] - password [facility]  [facility]   Office  [phone]

## 2015-08-15 NOTE — Progress Notes (Signed)
Short note (non-billable) Spoke with Rolan Bucco on the phone.  He states Truddie Crumble. Is still struggling with the DNR, but will hopefully have an answer 1/13.  Rolan Bucco shares that the patient was not cooperative with bathing/dressing at ALF - possibly because he was in pain.  Patient would not report pain, but would grimace and not want to move.  Discussed the idea of giving oxy ir prior to bathing/moving patient with Dr. Dyann Kief.  He felt this would be a worthwhile trial.    Disposition of the patient is still undetermined.   ALF vs SNF.

## 2015-08-15 NOTE — Progress Notes (Signed)
Patient ID: Kent Mayo, male   DOB: 1927-01-17, 82 y.o.   MRN: [id number]    Subjective: Kent Mayo voices no new complaints this morning.  The Chest CT exhibits multiple areas of probable metastases including bone, liver and adrenal gland.   His Head CT is negative.   I spoke to his brother regarding the prostate cancer and its probable contribution to his weight loss and we discussed therapy with androgen ablation and he thought that was reasonable to continue.  I explained that response is difficult to predict but his quality of life could be improved in the near term with androgen ablation.  ROS:  Review of Systems  Unable to perform ROS: dementia    Anti-infectives: Anti-infectives    Start     Dose/Rate Route Frequency Ordered Stop   08/14/15 1700  cefUROXime (CEFTIN) tablet 500 mg     500 mg Oral 2 times daily with meals 08/14/15 1622     08/12/15 0115  cefTRIAXone (ROCEPHIN) 1 g in dextrose 5 % 50 mL IVPB  Status:  Discontinued     1 g 100 mL/hr over 30 Minutes Intravenous Every 24 hours 08/12/15 0113 08/14/15 1622      Current Facility-Administered Medications  Medication Dose Route Frequency Provider Last Rate Last Dose  . atorvastatin (LIPITOR) tablet 10 mg  10 mg Oral QHS Rise Patience, MD   10 mg at 08/14/15 2221  . brimonidine (ALPHAGAN) 0.2 % ophthalmic solution 1 drop  1 drop Both Eyes TID Rise Patience, MD   1 drop at 08/14/15 2200  . carvedilol (COREG) tablet 6.25 mg  6.25 mg Oral BID WC Rise Patience, MD   6.25 mg at 08/14/15 1654  . cefUROXime (CEFTIN) tablet 500 mg  500 mg Oral BID WC Barton Dubois, MD   500 mg at 08/14/15 2221  . donepezil (ARICEPT) tablet 10 mg  10 mg Oral BID Rise Patience, MD   10 mg at 08/14/15 2221  . dorzolamide-timolol (COSOPT) 22.3-6.8 MG/ML ophthalmic solution 1 drop  1 drop Right Eye BID Rise Patience, MD   1 drop at 08/14/15 2200  . enoxaparin (LOVENOX) injection 40 mg  40 mg Subcutaneous Daily Rise Patience, MD   40 mg at 08/14/15 0853  . feeding supplement (ENSURE ENLIVE) (ENSURE ENLIVE) liquid 237 mL  237 mL Oral TID BM Rise Patience, MD   237 mL at 08/14/15 1900  . guaiFENesin (MUCINEX) 12 hr tablet 600 mg  600 mg Oral BID PRN Rise Patience, MD      . LORazepam (ATIVAN) tablet 0.5 mg  0.5 mg Oral QHS PRN Rise Patience, MD      . megestrol (MEGACE) 400 MG/10ML suspension 400 mg  400 mg Oral BID Rise Patience, MD   400 mg at 08/14/15 2227  . memantine (NAMENDA) tablet 10 mg  10 mg Oral BID Rise Patience, MD   10 mg at 08/14/15 2221  . mirtazapine (REMERON) tablet 15 mg  15 mg Oral QHS Rise Patience, MD   15 mg at 08/14/15 2221  . senna-docusate (Senokot-S) tablet 1 tablet  1 tablet Oral QHS PRN Rise Patience, MD      . traMADol Veatrice Bourbon) tablet 50 mg  50 mg Oral BID Rise Patience, MD   50 mg at 08/14/15 2221  . verapamil (CALAN-SR) CR tablet 240 mg  240 mg Oral Daily Rise Patience, MD   [phone]  mg at 08/14/15 0848     Objective: Vital signs in last 24 hours: Temp:  [97.5 F (36.4 C)-98.1 F (36.7 C)] 98.1 F (36.7 C) (01/12 0612) Pulse Rate:  [61-71] 71 (01/12 0612) Resp:  [18] 18 (01/12 0612) BP: (128-173)/(60-76) 141/65 mmHg (01/12 0612) SpO2:  [100 %] 100 % (01/12 0612)  Intake/Output from previous day: 01/11 0701 - 01/12 0700 In: 960 [P.O.:960] Out: 200 [Urine:200] Intake/Output this shift:     Physical Exam  Constitutional:  Well, developed, thin in NAD  Genitourinary:  Urine remains clear    Lab Results:   Recent Labs  08/13/15 0350  WBC 9.7  HGB 11.8*  HCT 36.1*  PLT 316   BMET  Recent Labs  08/13/15 0350  NA 143  K 3.8  CL 108  CO2 23  GLUCOSE 91  BUN 24*  CREATININE 1.55*  CALCIUM 9.2   PT/INR No results for input(s): LABPROT, INR in the last 72 hours. ABG No results for input(s): PHART, HCO3 in the last 72 hours.  Invalid input(s): PCO2, PO2  Studies/Results: Dg Eye Foreign  Body  08/13/2015  CLINICAL DATA:  Metal working/exposure; clearance prior to MRI EXAM: ORBITS FOR FOREIGN BODY - 2 VIEW COMPARISON:  CT 08/11/2015 FINDINGS: There is a and radiopaque implants noted within the right orbit, seen lateral to the right globe on recent CT. Small density projects over the left orbit on the first image. On prior CT, there is a metallic foreign body noted along the anterior left globe. IMPRESSION: Radiodense implant within the right orbit. Small metallic foreign body along and possibly within the anterior globe on the left. Electronically Signed   By: Rolm Baptise M.D.   On: 08/13/2015 11:44   Ct Head Wo Contrast  08/13/2015  CLINICAL DATA:  Prostate cancer. Altered mental status. Evaluate for brain Mets. EXAM: CT HEAD WITHOUT CONTRAST TECHNIQUE: Contiguous axial images were obtained from the base of the skull through the vertex without intravenous contrast. COMPARISON:  None. FINDINGS: There is low attenuation throughout the subcortical and periventricular white matter compatible with chronic microvascular disease. There is prominence of the sulci and the ventricles consistent with brain atrophy. There is no abnormal extra-axial fluid collection, intracranial hemorrhage or mass. The mastoid air cells are clear. The paranasal sinuses are clear. IMPRESSION: 1. No acute intracranial abnormalities.  No evidence for brain mass. 2. Small vessel ischemic disease and brain atrophy. Electronically Signed   By: Kerby Moors M.D.   On: 08/13/2015 16:11     Assessment and Plan: Widely metastatic prostate cancer with baseline hypogonadism.   He has been started on firmagon and it is difficult to predict the duration of response in this situation but I would suspect it would be measured in months more than years.  He will need a PSA and testosterone level in 1 month with his next injection of 80mg  Firmagon.  I think a hospice consultation would be very appropriate.         LOS: 4  days    [name] 08/15/2015 [phone]

## 2015-08-15 NOTE — Progress Notes (Signed)
Physical Therapy Treatment Patient Details Name: Kent Mayo MRN: [id number] DOB: 01/09/1927 Today's Date: 08/15/2015    History of Present Illness Patient is a 82 y.o. male presenting with fall. CT reveals no acute changes. MRI pending. PMH: CVA, HTN, DM, legally blind and very HOH. Pt is from ALF.      PT Comments    Pt soiled in BM and unaware of it in bed.  Pt needed 2 person A for rolling and performing peri hygiene.  Pt grimaces during bed mobility, but unable to state to PT where he is hurting.  Continue to feel pt will need SNF level of care unless ALF staff is able to provide this level of care.  Will continue to follow.    Follow Up Recommendations  SNF     Equipment Recommendations  None recommended by PT    Recommendations for Other Services       Precautions / Restrictions Precautions Precautions: Fall Precaution Comments: pt blind and very HOH. Restrictions Weight Bearing Restrictions: No    Mobility  Bed Mobility Overal bed mobility: Needs Assistance Bed Mobility: Rolling Rolling: Mod assist         General bed mobility comments: pt needs hand over hand cueing, but was able to roll with decreased A today as compared to eval.  pt soiled with BM and needed to roll multiple times for hygiene.    Transfers                    Ambulation/Gait                 Stairs            Wheelchair Mobility    Modified Rankin (Stroke Patients Only)       Balance                                    Cognition Arousal/Alertness: Awake/alert Behavior During Therapy: Flat affect Overall Cognitive Status: Difficult to assess                      Exercises      General Comments        Pertinent Vitals/Pain Pain Assessment: Faces Faces Pain Scale: Hurts even more Pain Location: Grimaces with bed mobility, but unable to state location.   Pain Descriptors / Indicators: Grimacing Pain Intervention(s):  Monitored during session;Repositioned;Limited activity within patient's tolerance    Home Living                      Prior Function            PT Goals (current goals can now be found in the care plan section) Acute Rehab PT Goals Patient Stated Goal: pt unable to state. PT Goal Formulation: Patient unable to participate in goal setting Time For Goal Achievement: 08/26/15 Potential to Achieve Goals: Fair Progress towards PT goals: Progressing toward goals    Frequency  Min 2X/week    PT Plan Current plan remains appropriate    Co-evaluation             End of Session   Activity Tolerance: Patient limited by pain Patient left: in bed;with call bell/phone within reach;with bed alarm set     Time: [id number] PT Time Calculation (min) (ACUTE ONLY): 18 min  Charges:  $Therapeutic Activity: 8-22 mins  G CodesCatarina Hartshorn, [place] 08/15/2015, 10:30 AM

## 2015-08-16 DIAGNOSIS — Z87821 Personal history of retained foreign body fully removed: Secondary | ICD-10-CM

## 2015-08-16 DIAGNOSIS — M545 Low back pain, unspecified: Secondary | ICD-10-CM | POA: Insufficient documentation

## 2015-08-16 LAB — GLUCOSE, CAPILLARY
GLUCOSE-CAPILLARY: 109 mg/dL — AB (ref 65–99)
Glucose-Capillary: 71 mg/dL (ref 65–99)

## 2015-08-16 MED ORDER — LIDOCAINE 5 % EX PTCH: 1.0000 | MEDICATED_PATCH | CUTANEOUS | Status: AC

## 2015-08-16 MED ORDER — CEFUROXIME AXETIL 500 MG PO TABS: 500.0000 mg | ORAL_TABLET | Freq: Two times a day (BID) | ORAL | Status: AC

## 2015-08-16 MED ORDER — LORAZEPAM 0.5 MG PO TABS: 0.5000 mg | ORAL_TABLET | Freq: Two times a day (BID) | ORAL | Status: AC | PRN

## 2015-08-16 MED ORDER — OXYCODONE HCL 5 MG PO TABS: 5.0000 mg | ORAL_TABLET | Freq: Four times a day (QID) | ORAL | Status: AC | PRN

## 2015-08-16 NOTE — Clinical Social Work Note (Signed)
CSW received notification from [facility] that the facility WILL NOT be able to accept the patient back at this time and urge the family to follow the SNF recommendations. Rolan Bucco notified of this. Rolan Bucco states he will give CSW decision on which SNF the patient will DC to in a couple of hours.    Liz Beach MSW, [place], [place], [id number]

## 2015-08-16 NOTE — Progress Notes (Signed)
Palliative:   Kent Mayo is sleeping comfortably when I enter the room and I did not awaken him. I did call and speak to his brother Kent Mayo to follow up on our conversations. Kent Mayo tells me that he agrees that "we should not do anything that's going to hurt him" - speaks of CPR/shock and breaking ribs. However, he says that he cannot make this decision before speaking with Kent Mayo son, Kent Mayo, and that Kent Mayo has not called him back. Kent Mayo does mention breathing tubes/machines and that he would want anything to keep his brother alive. We spoke about treating the things we can treat and that there are things we can do but we want to make sure the benefits outweigh the risks/harm of putting him through this and then the expectations that a breathing machine would not fix the problems he is having. Talking with Kent Mayo it sounds as if they do want everything being offered to keep him alive if it does not "harm" him. Unfortunately, still no concrete decisions today. Will need palliative follow up at SNF.   [name], NP Palliative Medicine Team Pager # [phone] (M-F 8a-5p) Team Phone # [phone] (Nights/Weekends)

## 2015-08-16 NOTE — Discharge Summary (Signed)
Physician Discharge Summary  Kent Mayo [id number] DOB: [birth year] DOA: 08/11/2015  PCP: No primary care provider on file.  Admit date: 08/11/2015 Discharge date: 08/16/2015  Time spent: 35 minutes  Recommendations for Outpatient Follow-up:  1. Repeat BMET in 5 days to follow electrolytes and renal function   Discharge Diagnoses:  Principal Problem:   Fall at nursing home Active Problems:   HTN (hypertension)   FTT (failure to thrive) in adult   Syncope and collapse   Renal failure (ARF), acute on chronic ([place])   Fall   Palliative care encounter   Prostate cancer metastatic to multiple sites ([place])   Anorexia   Dementia   Malignancy ([place])   Hydronephrosis   Severe protein-calorie malnutrition ([place])   Discharge Condition: stable and comfortable overall. Will discharge to SNF for further care.  Diet recommendation: regular diet (to try to help with nutrition)   Filed Weights   08/13/15 0500 08/14/15 0500 08/16/15 0458  Weight: 82.781 kg (182 lb 8 oz) 81.784 kg (180 lb 4.8 oz) 79.969 kg (176 lb 4.8 oz)    History of present illness:  82 y.o. male with history of dementia, hypertension, chronic anemia, chronic kidney disease and legally blind was brought to the ER patient had a fall at his nursing home. As per patient's brother patient has not been eating well for last 1 month. Patient is wheelchair bound and this evening patient was found to be on the floor. As per the patient's brother patient probably did not lose consciousness. Since patient has dementia and patient was not able to contribute to history. Patient on exam was complaining of pain around the lower extremities. X-ray of the pelvis did not show any fracture. There is some bruising around the knee and x-rays are pending. CT head and neck did not show anything acute. CT abdomen shows possible prostate cancer with left-sided hydronephrosis. At this time cause for patient's fall is not known if it is syncope or near  syncope.   Hospital Course:  Fall - Most likely related to dehydration, failure to thrive, UTI and metastatic cancer. - Seen by PT, they recommended SNF. -patient will be discharge to SNF -will benefit of palliative care follow up at facility ([place], continue discussion about code status and depending response, transition to full comfort)  Failure to thrive/ severe protein calorie malnutritionIn context of cancer, dementia and chronic illness - Patient appears to be malnourished, this is most likely related to his metastatic cancer, with known baseline dementia, will follow rec's from nutritional service regarding feeding supplementation -started on Megace  -advise to keep himself well hydrated -has lost approx 85 pounds in 5-6 months.  UTI -Continue with ceftin now; will need another 3 days of therapy after discharge -urine culture with multiple species  -no dysuria currently -afebrile and with normal WBC's  metastatic prostate cancer -Imaging obtained showing evidence of prostate/ bladder cancer, with presumed metastasis involving liver, adrenal glands, and bones (ribs, thoracic and lumbar spine). - Can not obtain MRI brain secondary to foreign object. -CT head neg for metastasis  - Dr. Jeffie Pollock consult appreciated, PSA> 500, Apparent T4 N1 M1 prostate cancer with left ureteral obstruction and baseline hypogonadism - No indication for stent or percutaneous drainage for left hydronephrosis at this point (given stable renal function) -urology recommending androgen ablation; further management per urology  -prognosis is poor overall, especially given degree of malnutrition and deconditioning -patient started on lidocaine patch and oxycodone to help with pain from bone metastasis  Dementia -Continue supportive care and home medication  Hypertension -Blood pressure acceptable, continue with home antihypertensive medication.  HLD -will continue statins  chronic renal disease stage  III -Baseline creatinine 1.6 -at discharge 1.5 -Will need intermittent monitoring    Procedures:  See below for x-ray reports   Consultations:  Urology   Palliative Care  Discharge Exam: Filed Vitals:   08/16/15 0458 08/16/15 1011  BP: 149/69 143/73  Pulse: 70   Temp: 98 F (36.7 C)   Resp: 16    Awake, oriented to person only. Legally blind. Reports appetite still poor. No CP, no nausea, no vomiting. Overall appears comfortable, but when movement is attempted (by Physical therapy service) patient with grimacing and appears to be in pain.  Supple Neck, No JVD, Symmetrical Chest wall movement, Good air movement bilaterally, no wheezing  RRR,No Gallops, Rubs or Murmurs +ve B.Sounds, Abd Soft, No tenderness, No rebound - guarding or rigidity. No Cyanosis, Clubbing or edema, No new Rash or bruises    Discharge Instructions   Discharge Instructions    Discharge instructions    Complete by:  As directed   Maintain good hydration Encourage PO intake and good nutrition  Patient needs assistance for all his ADL's Please have palliative care following patient along to help with further goals of care decisions and transition to full comfort if patient failed to improve or further declines          Current Discharge Medication List    START taking these medications   Details  cefUROXime (CEFTIN) 500 MG tablet Take 1 tablet (500 mg total) by mouth 2 (two) times daily with a meal. For 3 more days    lidocaine (LIDODERM) 5 % Place 1 patch onto the skin daily. Remove & Discard patch within 12 hours or as directed by MD Qty: 20 patch, Refills: 0    oxyCODONE (OXY IR/ROXICODONE) 5 MG immediate release tablet Take 1 tablet (5 mg total) by mouth every 6 (six) hours as needed for severe pain (please give to patient prior to bathing / cleaning / physical therapy.). Qty: 30 tablet, Refills: 0      CONTINUE these medications which have CHANGED   Details  LORazepam (ATIVAN) 0.5  MG tablet Take 1 tablet (0.5 mg total) by mouth every 12 (twelve) hours as needed for anxiety (insomnia). Qty: 20 tablet, Refills: 0      CONTINUE these medications which have NOT CHANGED   Details  acetaminophen (TYLENOL) 650 MG CR tablet Take 650 mg by mouth every 8 (eight) hours.    atorvastatin (LIPITOR) 10 MG tablet Take 10 mg by mouth at bedtime.    brimonidine (ALPHAGAN) 0.2 % ophthalmic solution Place 1 drop into both eyes 3 (three) times daily.    carvedilol (COREG) 6.25 MG tablet Take 6.25 mg by mouth 2 (two) times daily. With food. Refills: 6    donepezil (ARICEPT) 10 MG tablet Take 10 mg by mouth 2 (two) times daily.     dorzolamide-timolol (COSOPT) 22.3-6.8 MG/ML ophthalmic solution Place 1 drop into the right eye 2 (two) times daily.    guaiFENesin (MUCINEX) 600 MG 12 hr tablet Take 600 mg by mouth 2 (two) times daily as needed for to loosen phlegm.    megestrol (MEGACE) 400 MG/10ML suspension Take 10 mLs (400 mg total) by mouth 2 (two) times daily. Qty: 240 mL, Refills: 0    memantine (NAMENDA) 10 MG tablet Take 10 mg by mouth 2 (two) times daily.  mirtazapine (REMERON) 15 MG tablet Take 15 mg by mouth at bedtime.    NUTRITIONAL SUPPLEMENT LIQD Take 1 Can by mouth 3 (three) times daily. Between meals. "Med Pass."    senna-docusate (SENOKOT-S) 8.6-50 MG per tablet Take 1 tablet by mouth at bedtime as needed for mild constipation. Qty: 30 tablet, Refills: 0    verapamil (CALAN-SR) 240 MG CR tablet Take 240 mg by mouth daily.       STOP taking these medications     finasteride (PROSCAR) 5 MG tablet      traMADol (ULTRAM) 50 MG tablet        No Known Allergies Follow-up Information    Call [name], MD.   Specialty:  Urology   Why:  to confirmed follow up appointment (approx in 1 month)   Contact information:   [place] [address] [phone]       The results of significant diagnostics from this hospitalization (including  imaging, microbiology, ancillary and laboratory) are listed below for reference.    Significant Diagnostic Studies: Ct Abdomen Pelvis Wo Contrast  08/11/2015  CLINICAL DATA:  Unwitnessed fall EXAM: CT ABDOMEN AND PELVIS WITHOUT CONTRAST TECHNIQUE: Multidetector CT imaging of the abdomen and pelvis was performed following the standard protocol without IV contrast. COMPARISON:  08/11/2015 FINDINGS: Lower chest: There is an at least moderate partially visualized left pleural effusion. There is a small right pleural effusion. Bilaterally there is underlying atelectasis. Hepatobiliary: Mild sludge layering dependently in the gallbladder. Gallbladder is mildly distended with no evidence of inflammation surrounding it. Liver is normal. Pancreas: Normal Spleen: Normal Adrenals/Urinary Tract: There is moderate to severe dilatation of the left ureter and moderate left hydronephrosis. Several tiny nonobstructing left renal calculi are noted. There is evidence of a large lobulated soft tissue mass involving the mid to inferior left aspect of the bladder measuring about 7 by 7 x 5.5 cm. Stomach/Bowel: Nonobstructive bowel gas pattern. Diverticulosis of the sigmoid colon. Mild to moderate fecal impaction in the rectum which is distended to a diameter of 7.5 cm. Vascular/Lymphatic: Atherosclerotic aortoiliac calcification. Soft tissue fullness in the retroperitoneum in both the. Aortic and aortocaval region. This suggests adenopathy as described on report of CT scan performed 08/11/2015. There is bulky left pelvic sidewall adenopathy. Reproductive: The mass involving the left inferior bladder could arise from either the prostate or the bladder. Musculoskeletal: Numerous sclerotic lesions throughout the thoracolumbar spine and sacrum. Sclerotic lesions are also seen in the iliac bones and sacrum. IMPRESSION: Evidence of carcinoma arising either from the prostate or the bladder, but involving both organs and causing left  hydronephrosis. Evidence of metastatic adenopathy in the retroperitoneum and in the pelvis. Bilateral pleural effusions. Electronically Signed   By: Skipper Cliche M.D.   On: 08/11/2015 20:19   Dg Eye Foreign Body  08/13/2015  CLINICAL DATA:  Metal working/exposure; clearance prior to MRI EXAM: ORBITS FOR FOREIGN BODY - 2 VIEW COMPARISON:  CT 08/11/2015 FINDINGS: There is a and radiopaque implants noted within the right orbit, seen lateral to the right globe on recent CT. Small density projects over the left orbit on the first image. On prior CT, there is a metallic foreign body noted along the anterior left globe. IMPRESSION: Radiodense implant within the right orbit. Small metallic foreign body along and possibly within the anterior globe on the left. Electronically Signed   By: Rolm Baptise M.D.   On: 08/13/2015 11:44   Dg Chest 2 View  08/06/2015  CLINICAL DATA:  Productive cough for 3 days with clear thick sputum. EXAM: CHEST  2 VIEW COMPARISON:  07/04/2015. FINDINGS: Telemetry leads overlie the chest. Normal cardiomediastinal silhouette. Bibasilar subsegmental atelectasis is suspected. No active infiltrates or failure can be observed. There is degenerative change both shoulders. IMPRESSION: Bibasilar subsegmental atelectasis is suspected. No active infiltrates or failure. Worsening aeration from priors. Electronically Signed   By: Staci Righter M.D.   On: 08/06/2015 11:21   Dg Knee 1-2 Views Left  08/12/2015  CLINICAL DATA:  Bilateral knee pain. EXAM: LEFT KNEE - 1-2 VIEW; RIGHT KNEE - 1-2 VIEW COMPARISON:  Left knee radiographs 08/11/2015 FINDINGS: Moderate tricompartmental degenerative changes bilaterally. No acute bony findings or osteochondral abnormality. No definite joint effusion. IMPRESSION: Moderate degenerative changes but no acute bony findings or joint effusion. Electronically Signed   By: Marijo Sanes M.D.   On: 08/12/2015 08:09   Dg Knee 1-2 Views Right  08/12/2015  CLINICAL DATA:   Bilateral knee pain. EXAM: LEFT KNEE - 1-2 VIEW; RIGHT KNEE - 1-2 VIEW COMPARISON:  Left knee radiographs 08/11/2015 FINDINGS: Moderate tricompartmental degenerative changes bilaterally. No acute bony findings or osteochondral abnormality. No definite joint effusion. IMPRESSION: Moderate degenerative changes but no acute bony findings or joint effusion. Electronically Signed   By: Marijo Sanes M.D.   On: 08/12/2015 08:09   Dg Tibia/fibula Left  08/11/2015  CLINICAL DATA:  Recent fall, initial encounter EXAM: LEFT TIBIA AND FIBULA - 2 VIEW COMPARISON:  None. FINDINGS: No acute fracture or dislocation is noted. There is some bony bridging between the distal tibia and distal fibula. No joint effusion is seen no soft tissue abnormalities are noted. IMPRESSION: No acute abnormality seen. Electronically Signed   By: Inez Catalina M.D.   On: 08/11/2015 17:34   Ct Head Wo Contrast  08/13/2015  CLINICAL DATA:  Prostate cancer. Altered mental status. Evaluate for brain Mets. EXAM: CT HEAD WITHOUT CONTRAST TECHNIQUE: Contiguous axial images were obtained from the base of the skull through the vertex without intravenous contrast. COMPARISON:  None. FINDINGS: There is low attenuation throughout the subcortical and periventricular white matter compatible with chronic microvascular disease. There is prominence of the sulci and the ventricles consistent with brain atrophy. There is no abnormal extra-axial fluid collection, intracranial hemorrhage or mass. The mastoid air cells are clear. The paranasal sinuses are clear. IMPRESSION: 1. No acute intracranial abnormalities.  No evidence for brain mass. 2. Small vessel ischemic disease and brain atrophy. Electronically Signed   By: Kerby Moors M.D.   On: 08/13/2015 16:11   Ct Head Wo Contrast  08/11/2015  CLINICAL DATA:  Unwitnessed fall with headaches and neck pain, initial encounter EXAM: CT HEAD WITHOUT CONTRAST CT CERVICAL SPINE WITHOUT CONTRAST TECHNIQUE: Multidetector  CT imaging of the head and cervical spine was performed following the standard protocol without intravenous contrast. Multiplanar CT image reconstructions of the cervical spine were also generated. COMPARISON:  08/06/2015 FINDINGS: CT HEAD FINDINGS Bony calvarium is intact. Postsurgical changes are noted in the right orbit. Mild atrophic changes are noted. Mild chronic white matter ischemic change is seen. No findings to suggest acute hemorrhage, acute infarction or space-occupying mass lesion are noted. CT CERVICAL SPINE FINDINGS Seven cervical segments are well visualized. Vertebral body height is well maintained. Osteophytic changes noted from C3-C7. Facet hypertrophic changes are noted. No acute fracture or acute facet abnormality is noted. Surrounding soft tissue structures show evidence of a left-sided pleural effusion of relatively large size. IMPRESSION: CT of  the head:  No acute intracranial abnormality noted. CT of the cervical spine: Degenerative changes without acute bony abnormality. Large left pleural effusion. Electronically Signed   By: Inez Catalina M.D.   On: 08/11/2015 16:53   Ct Head Wo Contrast  08/06/2015  CLINICAL DATA:  Altered mental status today.  Hypertension, UTI EXAM: CT HEAD WITHOUT CONTRAST TECHNIQUE: Contiguous axial images were obtained from the base of the skull through the vertex without intravenous contrast. COMPARISON:  07/04/2015 FINDINGS: There is atrophy and chronic small vessel disease changes. No acute intracranial abnormality. Specifically, no hemorrhage, hydrocephalus, mass lesion, acute infarction, or significant intracranial injury. No acute calvarial abnormality. IMPRESSION: No acute intracranial abnormality. Atrophy, chronic microvascular disease. Electronically Signed   By: Rolm Baptise M.D.   On: 08/06/2015 11:11   Ct Chest Wo Contrast  08/12/2015  CLINICAL DATA:  82 year old male with failure to thrive. Prostate cancer. Pleural effusion. EXAM: CT CHEST WITHOUT  CONTRAST TECHNIQUE: Multidetector CT imaging of the chest was performed following the standard protocol without IV contrast. COMPARISON:  08/11/2015 and 07/03/2025 chest x-ray FINDINGS: Moderate-size left-sided pleural effusion. Consolidation left lower lobe. No obstructing lesion of the bronchi of the left lower lobe and therefore this may represent passive atelectasis or infiltrate. Small right sided pleural effusion. Anterior right upper lobe and right middle lobe tiny nodules with suggestion of small calcifications may represent small granulomas although metastatic disease not excluded. Noncalcified left upper lobe 3 mm nodule of indeterminate etiology, metastatic disease not excluded. Mediastinal adenopathy. Index lymph nodes pretracheal region with transverse dimension of 2.6 x 1.9 cm and 2.7 x 2.4 cm. Preaortic adenopathy measures 2.3 x 1.9 cm. Retrocrural adenopathy measures 1.8 x 1.7 cm. Diffuse osseous metastatic disease. Sclerotic metastatic foci seen throughout the lower cervical spine and throughout the thoracic spine upper lumbar spine. At the T9 level, circumferential soft tissue component tumor with possibly of epidural spread on the right. There may be epidural spread of tumor on the right at the T8 level and T6 level. Thoracic spine MR would be necessary for further delineation. Multiple metastatic rib lesions most destructive involving the posterior left sixth rib. Findings suspicious for 4.6 x 6.1 cm liver lesion although incompletely assessed on the present unenhanced CT chest. Mild enlargement of the right adrenal gland may represent hyperplasia or spread of tumor. Marked coronary artery calcifications. Heart size top-normal to slightly enlarged. 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 IMPRESSION: Moderate-size left-sided pleural effusion. Consolidation left lower lobe. No obstructing lesion of the bronchi of the left lower lobe and therefore this may represent passive  atelectasis or infiltrate. Small right sided pleural effusion. Anterior right upper lobe and right middle lobe tiny nodules with suggestion of small calcifications may represent small granulomas although metastatic disease not excluded. Noncalcified left upper lobe 3 mm nodule of indeterminate etiology, metastatic disease not excluded. Mediastinal adenopathy.  Retrocrural adenopathy. Diffuse osseous metastatic disease. Sclerotic metastatic foci seen throughout the lower cervical spine and throughout the thoracic spine upper lumbar spine. At the T9 level, circumferential soft tissue component tumor with possibly of epidural spread on the right. There may be epidural spread of tumor on the right at the T8 level and T6 level. Thoracic spine MR would be necessary for further delineation. Multiple metastatic rib lesions most destructive involving the posterior left sixth rib. Findings suspicious for 4.6 x 6.1 cm liver lesion although incompletely assessed on the present unenhanced CT chest. Mild enlargement of the right adrenal gland may represent  hyperplasia or spread of tumor. Marked coronary artery calcifications. Heart size top-normal to slightly enlarged. 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 Electronically Signed   By: Genia Del M.D.   On: 08/12/2015 09:12   Ct Cervical Spine Wo Contrast  08/11/2015  CLINICAL DATA:  Unwitnessed fall with headaches and neck pain, initial encounter EXAM: CT HEAD WITHOUT CONTRAST CT CERVICAL SPINE WITHOUT CONTRAST TECHNIQUE: Multidetector CT imaging of the head and cervical spine was performed following the standard protocol without intravenous contrast. Multiplanar CT image reconstructions of the cervical spine were also generated. COMPARISON:  08/06/2015 FINDINGS: CT HEAD FINDINGS Bony calvarium is intact. Postsurgical changes are noted in the right orbit. Mild atrophic changes are noted. Mild chronic white matter ischemic change is seen. No findings to  suggest acute hemorrhage, acute infarction or space-occupying mass lesion are noted. CT CERVICAL SPINE FINDINGS Seven cervical segments are well visualized. Vertebral body height is well maintained. Osteophytic changes noted from C3-C7. Facet hypertrophic changes are noted. No acute fracture or acute facet abnormality is noted. Surrounding soft tissue structures show evidence of a left-sided pleural effusion of relatively large size. IMPRESSION: CT of the head:  No acute intracranial abnormality noted. CT of the cervical spine: Degenerative changes without acute bony abnormality. Large left pleural effusion. Electronically Signed   By: Inez Catalina M.D.   On: 08/11/2015 16:53   Ct Lumbar Spine Wo Contrast  08/11/2015  CLINICAL DATA:  Status post unwitnessed fall today. Lower extremity and low back pain. Initial encounter. EXAM: CT LUMBAR SPINE WITHOUT CONTRAST TECHNIQUE: Multidetector CT imaging of the lumbar spine was performed without intravenous contrast administration. Multiplanar CT image reconstructions were also generated. COMPARISON:  None. FINDINGS: Vertebral body height and alignment are maintained. A sclerotic lesion in the L3 vertebral body to the right measures 2.8 cm AP by 2.1 cm transverse. Several punctate sclerotic lesions are also identified in the left ilium. Finally, a round sclerotic lesion is seen in the left side of the T11 vertebral body anterior to the pedicle measuring 1.3 cm AP by 1.1 cm transverse. Paraspinous structures demonstrate a small right and small to moderate left pleural effusion. Neither effusion is completely visualized. Bronchiectatic changes seen the right lower lobe. The patient has extensive retroperitoneal lymphadenopathy. Index paraaortic nodal mass measures approximately 3.4 x 2.9 cm on image 90 tripped. Inter-aortocaval node on image 90 measures approximately 2.1 cm short axis dimension. The patient has mild to moderate left hydronephrosis. Cause for obstruction is  not discretely identified but the left ureter is not seen in its entirety. There appears to be bladder wall thickening eccentric to the left. Two small nonobstructing stones are identified the left kidney. There is scattered facet degenerative disease lumbar spine. Minimal disc bulge and endplate spurring are noted at L3-4, L4-5 and L5-S1. IMPRESSION: Negative for fracture. Findings consistent with metastatic neoplastic process with extensive abdominal and pelvic lymphadenopathy and sclerotic bone lesions identified. There is left hydronephrosis with partial visualization of a lesion in the left side of the urinary bladder. Most likely differential considerations are prostate or bladder carcinoma. These results were called by telephone at the time of interpretation on 08/11/2015 at 5:00 pm to Dr. Esaw Grandchild , who verbally acknowledged these results. Electronically Signed   By: Inge Rise M.D.   On: 08/11/2015 17:01   Dg Chest Port 1 View  08/11/2015  CLINICAL DATA:  Recent fall EXAM: PORTABLE CHEST - 1 VIEW COMPARISON:  08/06/2015 FINDINGS: Cardiac shadow is within  normal limits. The lungs are well aerated bilaterally. No focal infiltrate or sizable effusion is noted. IMPRESSION: No acute abnormality noted. Electronically Signed   By: Inez Catalina M.D.   On: 08/11/2015 17:36   Dg Hips Bilat With Pelvis 2v  08/11/2015  CLINICAL DATA:  Status post fall. EXAM: DG HIP (WITH OR WITHOUT PELVIS) 2V BILAT COMPARISON:  None. FINDINGS: There is decreased bone mineralization. There is no evidence of hip fracture or dislocation. Mild to moderate bilateral hip joint osteoarthritic changes are seen, with joint space narrowing, subchondral sclerosis of the acetabula and osteophyte formation. IMPRESSION: No evidence of fracture or subluxation of bilateral hips. Electronically Signed   By: Fidela Salisbury M.D.   On: 08/11/2015 17:39    Microbiology: Recent Results (from the past 240 hour(s))  Urine culture      Status: None   Collection Time: 08/11/15 10:41 PM  Result Value Ref Range Status   Specimen Description URINE, CLEAN CATCH  Final   Special Requests NONE  Final   Culture MULTIPLE SPECIES PRESENT, SUGGEST RECOLLECTION  Final   Report Status 08/13/2015 FINAL  Final     Labs: Basic Metabolic Panel:  Recent Labs Lab 08/11/15 1716 08/12/15 0506 08/13/15 0350  NA 142 142 143  K 4.9 4.0 3.8  CL 107 109 108  CO2 23 24 23   GLUCOSE 89 82 91  BUN 27* 26* 24*  CREATININE 1.84* 1.69* 1.55*  CALCIUM 9.7 9.1 9.2   Liver Function Tests:  Recent Labs Lab 08/12/15 0506  AST 29  ALT 7*  ALKPHOS 270*  BILITOT 0.5  PROT 7.2  ALBUMIN 2.6*    Recent Labs Lab 08/13/15 0350  AMMONIA 31   CBC:  Recent Labs Lab 08/11/15 1716 08/12/15 0506 08/13/15 0350  WBC 15.4* 12.4* 9.7  NEUTROABS 11.8*  --   --   HGB 12.0* 10.9* 11.8*  HCT 35.9* 33.2* 36.1*  MCV 86.3 87.4 88.9  PLT 398 357 316   Cardiac Enzymes:  Recent Labs Lab 08/11/15 1716 08/11/15 2254 08/12/15 0506 08/12/15 1115  CKTOTAL 82  --   --   --   TROPONINI  --  0.03 0.04* 0.04*   CBG:  Recent Labs Lab 08/14/15 1659 08/15/15 0545 08/15/15 1136 08/16/15 0701 08/16/15 1113  GLUCAP 80 72 106* 71 109*    Signed:  Barton Dubois MD.  [facility] 08/16/2015, 1:33 PM

## 2015-08-16 NOTE — Clinical Social Work Placement (Signed)
   CLINICAL SOCIAL WORK PLACEMENT  NOTE  Date:  08/16/2015  Patient Details  Name: Kent Mayo MRN: [id number] Date of Birth: October 20, 1926  Clinical Social Work is seeking post-discharge placement for this patient at the [facility] level of care (*CSW will initial, date and re-position this form in  chart as items are completed):  Yes   Patient/family provided with [facility] Work Department's list of facilities offering this level of care within the geographic area requested by the patient (or if unable, by the patient's family).  Yes   Patient/family informed of their freedom to choose among providers that offer the needed level of care, that participate in Medicare, Medicaid or managed care program needed by the patient, have an available bed and are willing to accept the patient.  Yes   Patient/family informed of [facility]'s ownership interest in [facility] and [facility], as well as of the fact that they are under no obligation to receive care at these facilities.  PASRR submitted to EDS on       PASRR number received on       Existing PASRR number confirmed on 08/14/15     FL2 transmitted to all facilities in geographic area requested by pt/family on 08/14/15     FL2 transmitted to all facilities within larger geographic area on       Patient informed that his/her managed care company has contracts with or will negotiate with certain facilities, including the following:        Yes   Patient/family informed of bed offers received.  Patient chooses bed at [facility] and patient chooses bed at      Patient to be transferred to [facility] on 08/16/15.  Patient to be transferred to facility by Ambulance     Patient family notified on 08/16/15 of transfer.  Name of family member notified:  Rolan Bucco     PHYSICIAN Please prepare priority discharge summary, including  medications, Please prepare prescriptions, Please sign FL2     Additional Comment:  Per MD patient ready for DC to [facility]. RN, patient, patient's family, and facility notified of DC. RN given number for report. DC packet on chart. Ambulance transport requested for patient. CSW signing off.   _______________________________________________ Liz Beach MSW, [place], [place], [id number]

## 2015-08-16 NOTE — Plan of Care (Signed)
Problem: Education: Goal: Knowledge of [facility] Education information/materials will improve Outcome: Completed/Met Date Met:  08/16/15 Discussed with patient and SNF that patient will be D/c to

## 2015-08-19 ENCOUNTER — Encounter: Payer: Self-pay | Admitting: Internal Medicine

## 2015-08-19 ENCOUNTER — Non-Acute Institutional Stay (SKILLED_NURSING_FACILITY): Payer: Medicare Other | Admitting: Internal Medicine

## 2015-08-19 DIAGNOSIS — F0390 Unspecified dementia without behavioral disturbance: Secondary | ICD-10-CM

## 2015-08-19 DIAGNOSIS — R627 Adult failure to thrive: Secondary | ICD-10-CM | POA: Diagnosis not present

## 2015-08-19 DIAGNOSIS — E785 Hyperlipidemia, unspecified: Secondary | ICD-10-CM | POA: Diagnosis not present

## 2015-08-19 DIAGNOSIS — Z8673 Personal history of transient ischemic attack (TIA), and cerebral infarction without residual deficits: Secondary | ICD-10-CM | POA: Diagnosis not present

## 2015-08-19 DIAGNOSIS — C61 Malignant neoplasm of prostate: Secondary | ICD-10-CM

## 2015-08-19 DIAGNOSIS — I10 Essential (primary) hypertension: Secondary | ICD-10-CM

## 2015-08-19 DIAGNOSIS — N1339 Other hydronephrosis: Secondary | ICD-10-CM | POA: Diagnosis not present

## 2015-08-19 DIAGNOSIS — R5381 Other malaise: Secondary | ICD-10-CM | POA: Diagnosis not present

## 2015-08-19 DIAGNOSIS — N183 Chronic kidney disease, stage 3 (moderate): Secondary | ICD-10-CM

## 2015-08-19 DIAGNOSIS — E43 Unspecified severe protein-calorie malnutrition: Secondary | ICD-10-CM

## 2015-08-19 NOTE — Progress Notes (Signed)
Patient ID: Kent Mayo, male   DOB: 11/04/1926, 82 y.o.   MRN: [id number]    HISTORY AND PHYSICAL   DATE: 08/19/15  Location:  [facility]    Place of Service: SNF (31)   Extended Emergency Contact Information Primary Emergency Contact: Simar,Julius          [place] Phone: [phone] Mobile Phone: [phone] Relation: Brother  Advanced Directive information  FULL CODE  Chief Complaint  Patient presents with  . New Admit To SNF    HPI:  82 yo male seen today as a new admission into SNF following hospital stay for prostate CA with mets to multiple sites/left ureteral hydronephrosis, fall at nursing home, legally blind, UTI, HTN, FTT with severe protein calorie malnutrition, A/CKD. Urine cx resulted multiple species and will need ceftin x 3 days. PSA 539. CT abdomen/pelvis revealed mets to liver, adrenals and bones (ribs, thoracic and lumbar). CT head neg for mets. Urology consulted for left ureteral hydronephrosis seen on CT but no stent recommended due to stable renal fxn. Stage at T4 N1 M1. Albumin 2.6, Cr 1.69 - 1.55. Hgb 11.8 at d/c. Recommended palliative care consult  Today, he c/o left leg pain. When questioned about his blindness, he reported he could see just fine. Takes roxicodone IR for pain and lidoderm patches. Tylenol prn. No nursing issues. No falls since SNF admission.  pt is a poor historian due to dementia. Hx obtained from chart  DM - past hx. A1c 5.2%. Diet controlled  HTN - BP stable on verapamil, coreg  Hyperlipidemia - stable on lipitor  Hx CVA - stable. Takes statin. BP controlled  Dementia/FTT - he lost approx 85 lbs in 5-6 mos. Cont megace/remeron for appetite. Takes aricept/namenda. He is on ativan. Cont nutritional supplement  CKD3 - baseline Cr 1.6  OU blindness - uses cosopt and alphagan gtts for glaucoma  Past Medical History  Diagnosis Date  . Diabetes mellitus without  complication ([place])   . Hypertension   . Hearing difficulty of both ears     Bilateral hearing aids  . Blindness     Severe visual impairment OD, Blind OS  . Abnormality of gait   . Peripheral edema   . Glaucoma   . Dyslipidemia   . 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 UTI (urinary tract infection)   . Generalized muscle weakness   . Renal disorder   . Dysphagia   . Cognitive communication deficit   . Encephalopathy   . Urinary retention   . Neoplasm of prostate   . Renal failure   . Hyperlipidemia     Past Surgical History  Procedure Laterality Date  . Eye surgery      Patient Care Team: Jilda Panda, MD as Attending Physician (Internal Medicine)  Social History   Social History  . Marital Status: Single    Spouse Name: N/A  . Number of Children: N/A  . Years of Education: N/A   Occupational History  . Not on file.   Social History Main Topics  . Smoking status: Former Research scientist (life sciences)  . Smokeless tobacco: Never Used  . Alcohol Use: No  . Drug Use: No  . Sexual Activity: Not on file   Other Topics Concern  . Not on file   Social History Narrative     reports that he has quit smoking. He has never used smokeless tobacco. He reports that he does not drink alcohol or use illicit  drugs.  Family History  Problem Relation Age of Onset  . Hypertension Mother   . Hypertension Father   . Diabetes Mellitus II Brother    Family Status  Relation Status Death Age  . Mother Deceased   . Father Deceased   . Maternal Grandmother Deceased   . Maternal Grandfather Deceased   . Paternal Grandmother Deceased   . Paternal Grandfather Deceased   . Sister Deceased 23    MVA  . Brother Deceased     prostate  . Brother Deceased     prostate    There is no immunization history for the selected administration types on file for this patient.  No Known Allergies  Medications: Patient's Medications  New Prescriptions   No medications on file  Previous Medications    ACETAMINOPHEN (TYLENOL) 650 MG CR TABLET    Take 650 mg by mouth every 8 (eight) hours.   ATORVASTATIN (LIPITOR) 10 MG TABLET    Take 10 mg by mouth at bedtime.   BRIMONIDINE (ALPHAGAN) 0.2 % OPHTHALMIC SOLUTION    Place 1 drop into both eyes 3 (three) times daily.   CARVEDILOL (COREG) 6.25 MG TABLET    Take 6.25 mg by mouth 2 (two) times daily. With food.   CEFUROXIME (CEFTIN) 500 MG TABLET    Take 1 tablet (500 mg total) by mouth 2 (two) times daily with a meal. For 3 more days   DONEPEZIL (ARICEPT) 10 MG TABLET    Take 10 mg by mouth 2 (two) times daily.    DORZOLAMIDE-TIMOLOL (COSOPT) 22.3-6.8 MG/ML OPHTHALMIC SOLUTION    Place 1 drop into the right eye 2 (two) times daily.   GUAIFENESIN (MUCINEX) 600 MG 12 HR TABLET    Take 600 mg by mouth 2 (two) times daily as needed for to loosen phlegm.   LIDOCAINE (LIDODERM) 5 %    Place 1 patch onto the skin daily. Remove & Discard patch within 12 hours or as directed by MD   LORAZEPAM (ATIVAN) 0.5 MG TABLET    Take 1 tablet (0.5 mg total) by mouth every 12 (twelve) hours as needed for anxiety (insomnia).   MEGESTROL (MEGACE) 400 MG/10ML SUSPENSION    Take 10 mLs (400 mg total) by mouth 2 (two) times daily.   MEMANTINE (NAMENDA) 10 MG TABLET    Take 10 mg by mouth 2 (two) times daily.   MIRTAZAPINE (REMERON) 15 MG TABLET    Take 15 mg by mouth at bedtime.   NUTRITIONAL SUPPLEMENT LIQD    Take 1 Can by mouth 3 (three) times daily. Between meals. "Med Pass."   OXYCODONE (OXY IR/ROXICODONE) 5 MG IMMEDIATE RELEASE TABLET    Take 1 tablet (5 mg total) by mouth every 6 (six) hours as needed for severe pain (please give to patient prior to bathing / cleaning / physical therapy.).   SENNA-DOCUSATE (SENOKOT-S) 8.6-50 MG PER TABLET    Take 1 tablet by mouth at bedtime as needed for mild constipation.   VERAPAMIL (CALAN-SR) 240 MG CR TABLET    Take 240 mg by mouth daily.   Modified Medications   No medications on file  Discontinued Medications   No medications  on file    Review of Systems  Unable to perform ROS: Dementia    Filed Vitals:   08/19/15 1208  BP: 127/82  Pulse: 78  Temp: 97.7 F (36.5 C)  TempSrc: Oral  Resp: 16  SpO2: 100%   There is no weight on file to  calculate BMI.  Physical Exam  Constitutional: He appears well-developed and well-nourished.  Lying in bed resting in NAD. Frail appearing  HENT:  Mouth/Throat: Oropharynx is clear and moist.  Wearing hearing aids  Eyes: No scleral icterus.  OU corneal clouding  Neck: Neck supple. Carotid bruit is not present. No thyromegaly present.  Cardiovascular: Normal rate and intact distal pulses.  An irregular rhythm present. Exam reveals no gallop and no friction rub.   Murmur heard.  Systolic murmur is present with a grade of 1/6  No LE edema b/l. No calf TTP  Pulmonary/Chest: Effort normal and breath sounds normal. He has no wheezes. He has no rales. He exhibits no tenderness.  Abdominal: Soft. Bowel sounds are normal. He exhibits no distension, no abdominal bruit, no pulsatile midline mass and no mass. There is no hepatosplenomegaly. There is tenderness in the left lower quadrant. There is no rebound and no guarding.    Lymphadenopathy:    He has no cervical adenopathy.  Neurological: He is alert.  Skin: Skin is warm and dry. No rash noted.     Very dry b/l LE. No vesicles.  Psychiatric: He has a normal mood and affect. His behavior is normal.     Labs reviewed: Admission on 08/11/2015, Discharged on 08/16/2015  Component Date Value Ref Range Status  . Total CK 08/11/2015 82  49 - 397 U/L Final  . WBC 08/11/2015 15.4* 4.0 - 10.5 K/uL Final  . RBC 08/11/2015 4.16* 4.22 - 5.81 MIL/uL Final  . Hemoglobin 08/11/2015 12.0* 13.0 - 17.0 g/dL Final  . HCT 08/11/2015 35.9* 39.0 - 52.0 % Final  . MCV 08/11/2015 86.3  78.0 - 100.0 fL Final  . MCH 08/11/2015 28.8  26.0 - 34.0 pg Final  . MCHC 08/11/2015 33.4  30.0 - 36.0 g/dL Final  . RDW 08/11/2015 16.2* 11.5 - 15.5 %  Final  . Platelets 08/11/2015 398  150 - 400 K/uL Final  . Neutrophils Relative % 08/11/2015 76   Final  . Neutro Abs 08/11/2015 11.8* 1.7 - 7.7 K/uL Final  . Lymphocytes Relative 08/11/2015 13   Final  . Lymphs Abs 08/11/2015 2.0  0.7 - 4.0 K/uL Final  . Monocytes Relative 08/11/2015 10   Final  . Monocytes Absolute 08/11/2015 1.5* 0.1 - 1.0 K/uL Final  . Eosinophils Relative 08/11/2015 1   Final  . Eosinophils Absolute 08/11/2015 0.1  0.0 - 0.7 K/uL Final  . Basophils Relative 08/11/2015 0   Final  . Basophils Absolute 08/11/2015 0.0  0.0 - 0.1 K/uL Final  . Sodium 08/11/2015 142  135 - 145 mmol/L Final  . Potassium 08/11/2015 4.9  3.5 - 5.1 mmol/L Final  . Chloride 08/11/2015 107  101 - 111 mmol/L Final  . CO2 08/11/2015 23  22 - 32 mmol/L Final  . Glucose, Bld 08/11/2015 89  65 - 99 mg/dL Final  . BUN 08/11/2015 27* 6 - 20 mg/dL Final  . Creatinine, Ser 08/11/2015 1.84* 0.61 - 1.24 mg/dL Final  . Calcium 08/11/2015 9.7  8.9 - 10.3 mg/dL Final  . GFR calc non Af Amer 08/11/2015 32* >60 mL/min Final  . GFR calc Af Amer 08/11/2015 37* >60 mL/min Final   Comment: (NOTE) The eGFR has been calculated using the CKD EPI equation. This calculation has not been validated in all clinical situations. eGFR's persistently <60 mL/min signify possible Chronic Kidney Disease.   . Anion gap 08/11/2015 12  5 - 15 Final  . Troponin i, poc 08/11/2015  0.00  0.00 - 0.08 ng/mL Final  . Comment 3 08/11/2015          Final   Comment: Due to the release kinetics of cTnI, a negative result within the first hours of the onset of symptoms does not rule out myocardial infarction with certainty. If myocardial infarction is still suspected, repeat the test at appropriate intervals.   Marland Kitchen Specimen Description 08/11/2015 URINE, CLEAN CATCH   Final  . Special Requests 08/11/2015 NONE   Final  . Culture 08/11/2015 MULTIPLE SPECIES PRESENT, SUGGEST RECOLLECTION   Final  . Report Status 08/11/2015 08/13/2015  FINAL   Final  . Color, Urine 08/11/2015 AMBER* YELLOW Final   BIOCHEMICALS MAY BE AFFECTED BY COLOR  . APPearance 08/11/2015 CLOUDY* CLEAR Final  . Specific Gravity, Urine 08/11/2015 1.023  1.005 - 1.030 Final  . pH 08/11/2015 5.5  5.0 - 8.0 Final  . Glucose, UA 08/11/2015 NEGATIVE  NEGATIVE mg/dL Final  . Hgb urine dipstick 08/11/2015 LARGE* NEGATIVE Final  . Bilirubin Urine 08/11/2015 SMALL* NEGATIVE Final  . Ketones, ur 08/11/2015 15* NEGATIVE mg/dL Final  . Protein, ur 08/11/2015 30* NEGATIVE mg/dL Final  . Nitrite 08/11/2015 NEGATIVE  NEGATIVE Final  . Leukocytes, UA 08/11/2015 MODERATE* NEGATIVE Final  . Troponin i, poc 08/11/2015 0.00  0.00 - 0.08 ng/mL Final  . Comment 3 08/11/2015          Final   Comment: Due to the release kinetics of cTnI, a negative result within the first hours of the onset of symptoms does not rule out myocardial infarction with certainty. If myocardial infarction is still suspected, repeat the test at appropriate intervals.   . Troponin I 08/11/2015 0.03  <0.031 ng/mL Final   Comment:        NO INDICATION OF MYOCARDIAL INJURY.   . Troponin I 08/12/2015 0.04* <0.031 ng/mL Final   Comment:        PERSISTENTLY INCREASED TROPONIN VALUES IN THE RANGE OF 0.04-0.49 ng/mL CAN BE SEEN IN:       -UNSTABLE ANGINA       -CONGESTIVE HEART FAILURE       -MYOCARDITIS       -CHEST TRAUMA       -ARRYHTHMIAS       -LATE PRESENTING MYOCARDIAL INFARCTION       -COPD   CLINICAL FOLLOW-UP RECOMMENDED.   Marland Kitchen Troponin I 08/12/2015 0.04* <0.031 ng/mL Final   Comment:        PERSISTENTLY INCREASED TROPONIN VALUES IN THE RANGE OF 0.04-0.49 ng/mL CAN BE SEEN IN:       -UNSTABLE ANGINA       -CONGESTIVE HEART FAILURE       -MYOCARDITIS       -CHEST TRAUMA       -ARRYHTHMIAS       -LATE PRESENTING MYOCARDIAL INFARCTION       -COPD   CLINICAL FOLLOW-UP RECOMMENDED.   Marland Kitchen Hgb A1c MFr Bld 08/12/2015 5.2  4.8 - 5.6 % Final   Comment: (NOTE)         Pre-diabetes:  5.7 - 6.4         Diabetes: >6.4         Glycemic control for adults with diabetes: <7.0   . Mean Plasma Glucose 08/12/2015 103   Final   Comment: (NOTE) Performed At: [facility] [place], [place] [id number] Lindon Romp MD [id number]   . Sodium 08/12/2015 142  135 - 145 mmol/L Final  .  Potassium 08/12/2015 4.0  3.5 - 5.1 mmol/L Final   DELTA CHECK NOTED  . Chloride 08/12/2015 109  101 - 111 mmol/L Final  . CO2 08/12/2015 24  22 - 32 mmol/L Final  . Glucose, Bld 08/12/2015 82  65 - 99 mg/dL Final  . BUN 08/12/2015 26* 6 - 20 mg/dL Final  . Creatinine, Ser 08/12/2015 1.69* 0.61 - 1.24 mg/dL Final  . Calcium 08/12/2015 9.1  8.9 - 10.3 mg/dL Final  . Total Protein 08/12/2015 7.2  6.5 - 8.1 g/dL Final  . Albumin 08/12/2015 2.6* 3.5 - 5.0 g/dL Final  . AST 08/12/2015 29  15 - 41 U/L Final  . ALT 08/12/2015 7* 17 - 63 U/L Final  . Alkaline Phosphatase 08/12/2015 270* 38 - 126 U/L Final  . Total Bilirubin 08/12/2015 0.5  0.3 - 1.2 mg/dL Final  . GFR calc non Af Amer 08/12/2015 35* >60 mL/min Final  . GFR calc Af Amer 08/12/2015 41* >60 mL/min Final   Comment: (NOTE) The eGFR has been calculated using the CKD EPI equation. This calculation has not been validated in all clinical situations. eGFR's persistently <60 mL/min signify possible Chronic Kidney Disease.   . Anion gap 08/12/2015 9  5 - 15 Final  . WBC 08/12/2015 12.4* 4.0 - 10.5 K/uL Final  . RBC 08/12/2015 3.80* 4.22 - 5.81 MIL/uL Final  . Hemoglobin 08/12/2015 10.9* 13.0 - 17.0 g/dL Final  . HCT 08/12/2015 33.2* 39.0 - 52.0 % Final  . MCV 08/12/2015 87.4  78.0 - 100.0 fL Final  . MCH 08/12/2015 28.7  26.0 - 34.0 pg Final  . MCHC 08/12/2015 32.8  30.0 - 36.0 g/dL Final  . RDW 08/12/2015 16.5* 11.5 - 15.5 % Final  . Platelets 08/12/2015 357  150 - 400 K/uL Final  . Squamous Epithelial / LPF 08/11/2015 0-5* NONE SEEN Final  . WBC, UA 08/11/2015 6-30  0 - 5 WBC/hpf Final  . RBC / HPF 08/11/2015  TOO NUMEROUS TO COUNT  0 - 5 RBC/hpf Final  . Bacteria, UA 08/11/2015 MANY* NONE SEEN Final  . Glucose-Capillary 08/11/2015 70  65 - 99 mg/dL Final  . Glucose-Capillary 08/12/2015 115* 65 - 99 mg/dL Final  . Glucose-Capillary 08/12/2015 66  65 - 99 mg/dL Final  . Comment 1 08/12/2015 Notify RN   Final  . Comment 2 08/12/2015 Document in Chart   Final  . PSA 08/12/2015 539.00* 0.00 - 4.00 ng/mL Final   Comment: (NOTE) While PSA levels of <=4.0 ng/ml are reported as reference range, some men with levels below 4.0 ng/ml can have prostate cancer and many men with PSA above 4.0 ng/ml do not have prostate cancer.  Other tests such as free PSA, age specific reference ranges, PSA velocity and PSA doubling time may be helpful especially in men less than 73 years old.   . Glucose-Capillary 08/12/2015 91  65 - 99 mg/dL Final  . Glucose-Capillary 08/12/2015 86  65 - 99 mg/dL Final  . Comment 1 08/12/2015 Notify RN   Final  . Comment 2 08/12/2015 Document in Chart   Final  . WBC 08/13/2015 9.7  4.0 - 10.5 K/uL Final  . RBC 08/13/2015 4.06* 4.22 - 5.81 MIL/uL Final  . Hemoglobin 08/13/2015 11.8* 13.0 - 17.0 g/dL Final  . HCT 08/13/2015 36.1* 39.0 - 52.0 % Final  . MCV 08/13/2015 88.9  78.0 - 100.0 fL Final  . MCH 08/13/2015 29.1  26.0 - 34.0 pg Final  . MCHC 08/13/2015 32.7  30.0 - 36.0 g/dL Final  . RDW 08/13/2015 17.1* 11.5 - 15.5 % Final  . Platelets 08/13/2015 316  150 - 400 K/uL Final  . Sodium 08/13/2015 143  135 - 145 mmol/L Final  . Potassium 08/13/2015 3.8  3.5 - 5.1 mmol/L Final  . Chloride 08/13/2015 108  101 - 111 mmol/L Final  . CO2 08/13/2015 23  22 - 32 mmol/L Final  . Glucose, Bld 08/13/2015 91  65 - 99 mg/dL Final  . BUN 08/13/2015 24* 6 - 20 mg/dL Final  . Creatinine, Ser 08/13/2015 1.55* 0.61 - 1.24 mg/dL Final  . Calcium 08/13/2015 9.2  8.9 - 10.3 mg/dL Final  . GFR calc non Af Amer 08/13/2015 39* >60 mL/min Final  . GFR calc Af Amer 08/13/2015 45* >60 mL/min Final    Comment: (NOTE) The eGFR has been calculated using the CKD EPI equation. This calculation has not been validated in all clinical situations. eGFR's persistently <60 mL/min signify possible Chronic Kidney Disease.   . Anion gap 08/13/2015 12  5 - 15 Final  . Ammonia 08/13/2015 31  9 - 35 umol/L Final  . Testosterone 08/12/2015 104* 348 - 1197 ng/dL Final  . Comment, Testosterone 08/12/2015 Comment   Corrected   Comment: (NOTE) Adult male reference interval is based on a population of lean males up to 80 years old. Performed At: [facility] [place], [place] [id number] Lindon Romp MD [id number]   . Glucose-Capillary 08/12/2015 84  65 - 99 mg/dL Final  . Glucose-Capillary 08/13/2015 87  65 - 99 mg/dL Final  . Glucose-Capillary 08/13/2015 91  65 - 99 mg/dL Final  . Glucose-Capillary 08/13/2015 119* 65 - 99 mg/dL Final  . Comment 1 08/13/2015 Notify RN   Final  . Comment 2 08/13/2015 Document in Chart   Final  . Glucose-Capillary 08/14/2015 87  65 - 99 mg/dL Final  . Glucose-Capillary 08/14/2015 82  65 - 99 mg/dL Final  . Glucose-Capillary 08/14/2015 101* 65 - 99 mg/dL Final  . Glucose-Capillary 08/14/2015 80  65 - 99 mg/dL Final  . Glucose-Capillary 08/15/2015 72  65 - 99 mg/dL Final  . Glucose-Capillary 08/15/2015 106* 65 - 99 mg/dL Final  . Glucose-Capillary 08/16/2015 71  65 - 99 mg/dL Final  . Glucose-Capillary 08/16/2015 109* 65 - 99 mg/dL Final    Ct Abdomen Pelvis Wo Contrast  08/11/2015  CLINICAL DATA:  Unwitnessed fall EXAM: CT ABDOMEN AND PELVIS WITHOUT CONTRAST TECHNIQUE: Multidetector CT imaging of the abdomen and pelvis was performed following the standard protocol without IV contrast. COMPARISON:  08/11/2015 FINDINGS: Lower chest: There is an at least moderate partially visualized left pleural effusion. There is a small right pleural effusion. Bilaterally there is underlying atelectasis. Hepatobiliary: Mild sludge layering dependently in the  gallbladder. Gallbladder is mildly distended with no evidence of inflammation surrounding it. Liver is normal. Pancreas: Normal Spleen: Normal Adrenals/Urinary Tract: There is moderate to severe dilatation of the left ureter and moderate left hydronephrosis. Several tiny nonobstructing left renal calculi are noted. There is evidence of a large lobulated soft tissue mass involving the mid to inferior left aspect of the bladder measuring about 7 by 7 x 5.5 cm. Stomach/Bowel: Nonobstructive bowel gas pattern. Diverticulosis of the sigmoid colon. Mild to moderate fecal impaction in the rectum which is distended to a diameter of 7.5 cm. Vascular/Lymphatic: Atherosclerotic aortoiliac calcification. Soft tissue fullness in the retroperitoneum in both the. Aortic and aortocaval region. This suggests adenopathy as described on report  of CT scan performed 08/11/2015. There is bulky left pelvic sidewall adenopathy. Reproductive: The mass involving the left inferior bladder could arise from either the prostate or the bladder. Musculoskeletal: Numerous sclerotic lesions throughout the thoracolumbar spine and sacrum. Sclerotic lesions are also seen in the iliac bones and sacrum. IMPRESSION: Evidence of carcinoma arising either from the prostate or the bladder, but involving both organs and causing left hydronephrosis. Evidence of metastatic adenopathy in the retroperitoneum and in the pelvis. Bilateral pleural effusions. Electronically Signed   By: Skipper Cliche M.D.   On: 08/11/2015 20:19   Dg Eye Foreign Body  08/13/2015  CLINICAL DATA:  Metal working/exposure; clearance prior to MRI EXAM: ORBITS FOR FOREIGN BODY - 2 VIEW COMPARISON:  CT 08/11/2015 FINDINGS: There is a and radiopaque implants noted within the right orbit, seen lateral to the right globe on recent CT. Small density projects over the left orbit on the first image. On prior CT, there is a metallic foreign body noted along the anterior left globe. IMPRESSION:  Radiodense implant within the right orbit. Small metallic foreign body along and possibly within the anterior globe on the left. Electronically Signed   By: Rolm Baptise M.D.   On: 08/13/2015 11:44   Dg Chest 2 View  08/06/2015  CLINICAL DATA:  Productive cough for 3 days with clear thick sputum. EXAM: CHEST  2 VIEW COMPARISON:  07/04/2015. FINDINGS: Telemetry leads overlie the chest. Normal cardiomediastinal silhouette. Bibasilar subsegmental atelectasis is suspected. No active infiltrates or failure can be observed. There is degenerative change both shoulders. IMPRESSION: Bibasilar subsegmental atelectasis is suspected. No active infiltrates or failure. Worsening aeration from priors. Electronically Signed   By: Staci Righter M.D.   On: 08/06/2015 11:21   Dg Knee 1-2 Views Left  08/12/2015  CLINICAL DATA:  Bilateral knee pain. EXAM: LEFT KNEE - 1-2 VIEW; RIGHT KNEE - 1-2 VIEW COMPARISON:  Left knee radiographs 08/11/2015 FINDINGS: Moderate tricompartmental degenerative changes bilaterally. No acute bony findings or osteochondral abnormality. No definite joint effusion. IMPRESSION: Moderate degenerative changes but no acute bony findings or joint effusion. Electronically Signed   By: Marijo Sanes M.D.   On: 08/12/2015 08:09   Dg Knee 1-2 Views Right  08/12/2015  CLINICAL DATA:  Bilateral knee pain. EXAM: LEFT KNEE - 1-2 VIEW; RIGHT KNEE - 1-2 VIEW COMPARISON:  Left knee radiographs 08/11/2015 FINDINGS: Moderate tricompartmental degenerative changes bilaterally. No acute bony findings or osteochondral abnormality. No definite joint effusion. IMPRESSION: Moderate degenerative changes but no acute bony findings or joint effusion. Electronically Signed   By: Marijo Sanes M.D.   On: 08/12/2015 08:09   Dg Tibia/fibula Left  08/11/2015  CLINICAL DATA:  Recent fall, initial encounter EXAM: LEFT TIBIA AND FIBULA - 2 VIEW COMPARISON:  None. FINDINGS: No acute fracture or dislocation is noted. There is some bony  bridging between the distal tibia and distal fibula. No joint effusion is seen no soft tissue abnormalities are noted. IMPRESSION: No acute abnormality seen. Electronically Signed   By: Inez Catalina M.D.   On: 08/11/2015 17:34   Ct Head Wo Contrast  08/13/2015  CLINICAL DATA:  Prostate cancer. Altered mental status. Evaluate for brain Mets. EXAM: CT HEAD WITHOUT CONTRAST TECHNIQUE: Contiguous axial images were obtained from the base of the skull through the vertex without intravenous contrast. COMPARISON:  None. FINDINGS: There is low attenuation throughout the subcortical and periventricular white matter compatible with chronic microvascular disease. There is prominence of the sulci and the ventricles consistent  with brain atrophy. There is no abnormal extra-axial fluid collection, intracranial hemorrhage or mass. The mastoid air cells are clear. The paranasal sinuses are clear. IMPRESSION: 1. No acute intracranial abnormalities.  No evidence for brain mass. 2. Small vessel ischemic disease and brain atrophy. Electronically Signed   By: Kerby Moors M.D.   On: 08/13/2015 16:11   Ct Head Wo Contrast  08/11/2015  CLINICAL DATA:  Unwitnessed fall with headaches and neck pain, initial encounter EXAM: CT HEAD WITHOUT CONTRAST CT CERVICAL SPINE WITHOUT CONTRAST TECHNIQUE: Multidetector CT imaging of the head and cervical spine was performed following the standard protocol without intravenous contrast. Multiplanar CT image reconstructions of the cervical spine were also generated. COMPARISON:  08/06/2015 FINDINGS: CT HEAD FINDINGS Bony calvarium is intact. Postsurgical changes are noted in the right orbit. Mild atrophic changes are noted. Mild chronic white matter ischemic change is seen. No findings to suggest acute hemorrhage, acute infarction or space-occupying mass lesion are noted. CT CERVICAL SPINE FINDINGS Seven cervical segments are well visualized. Vertebral body height is well maintained. Osteophytic  changes noted from C3-C7. Facet hypertrophic changes are noted. No acute fracture or acute facet abnormality is noted. Surrounding soft tissue structures show evidence of a left-sided pleural effusion of relatively large size. IMPRESSION: CT of the head:  No acute intracranial abnormality noted. CT of the cervical spine: Degenerative changes without acute bony abnormality. Large left pleural effusion. Electronically Signed   By: Inez Catalina M.D.   On: 08/11/2015 16:53   Ct Head Wo Contrast  08/06/2015  CLINICAL DATA:  Altered mental status today.  Hypertension, UTI EXAM: CT HEAD WITHOUT CONTRAST TECHNIQUE: Contiguous axial images were obtained from the base of the skull through the vertex without intravenous contrast. COMPARISON:  07/04/2015 FINDINGS: There is atrophy and chronic small vessel disease changes. No acute intracranial abnormality. Specifically, no hemorrhage, hydrocephalus, mass lesion, acute infarction, or significant intracranial injury. No acute calvarial abnormality. IMPRESSION: No acute intracranial abnormality. Atrophy, chronic microvascular disease. Electronically Signed   By: Rolm Baptise M.D.   On: 08/06/2015 11:11   Ct Chest Wo Contrast  08/12/2015  CLINICAL DATA:  82 year old male with failure to thrive. Prostate cancer. Pleural effusion. EXAM: CT CHEST WITHOUT CONTRAST TECHNIQUE: Multidetector CT imaging of the chest was performed following the standard protocol without IV contrast. COMPARISON:  08/11/2015 and 07/03/2025 chest x-ray FINDINGS: Moderate-size left-sided pleural effusion. Consolidation left lower lobe. No obstructing lesion of the bronchi of the left lower lobe and therefore this may represent passive atelectasis or infiltrate. Small right sided pleural effusion. Anterior right upper lobe and right middle lobe tiny nodules with suggestion of small calcifications may represent small granulomas although metastatic disease not excluded. Noncalcified left upper lobe 3 mm nodule  of indeterminate etiology, metastatic disease not excluded. Mediastinal adenopathy. Index lymph nodes pretracheal region with transverse dimension of 2.6 x 1.9 cm and 2.7 x 2.4 cm. Preaortic adenopathy measures 2.3 x 1.9 cm. Retrocrural adenopathy measures 1.8 x 1.7 cm. Diffuse osseous metastatic disease. Sclerotic metastatic foci seen throughout the lower cervical spine and throughout the thoracic spine upper lumbar spine. At the T9 level, circumferential soft tissue component tumor with possibly of epidural spread on the right. There may be epidural spread of tumor on the right at the T8 level and T6 level. Thoracic spine MR would be necessary for further delineation. Multiple metastatic rib lesions most destructive involving the posterior left sixth rib. Findings suspicious for 4.6 x 6.1 cm liver lesion although incompletely assessed  on the present unenhanced CT chest. Mild enlargement of the right adrenal gland may represent hyperplasia or spread of tumor. Marked coronary artery calcifications. Heart size top-normal to slightly enlarged. 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 IMPRESSION: Moderate-size left-sided pleural effusion. Consolidation left lower lobe. No obstructing lesion of the bronchi of the left lower lobe and therefore this may represent passive atelectasis or infiltrate. Small right sided pleural effusion. Anterior right upper lobe and right middle lobe tiny nodules with suggestion of small calcifications may represent small granulomas although metastatic disease not excluded. Noncalcified left upper lobe 3 mm nodule of indeterminate etiology, metastatic disease not excluded. Mediastinal adenopathy.  Retrocrural adenopathy. Diffuse osseous metastatic disease. Sclerotic metastatic foci seen throughout the lower cervical spine and throughout the thoracic spine upper lumbar spine. At the T9 level, circumferential soft tissue component tumor with possibly of epidural spread on the  right. There may be epidural spread of tumor on the right at the T8 level and T6 level. Thoracic spine MR would be necessary for further delineation. Multiple metastatic rib lesions most destructive involving the posterior left sixth rib. Findings suspicious for 4.6 x 6.1 cm liver lesion although incompletely assessed on the present unenhanced CT chest. Mild enlargement of the right adrenal gland may represent hyperplasia or spread of tumor. Marked coronary artery calcifications. Heart size top-normal to slightly enlarged. 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 Electronically Signed   By: Genia Del M.D.   On: 08/12/2015 09:12   Ct Cervical Spine Wo Contrast  08/11/2015  CLINICAL DATA:  Unwitnessed fall with headaches and neck pain, initial encounter EXAM: CT HEAD WITHOUT CONTRAST CT CERVICAL SPINE WITHOUT CONTRAST TECHNIQUE: Multidetector CT imaging of the head and cervical spine was performed following the standard protocol without intravenous contrast. Multiplanar CT image reconstructions of the cervical spine were also generated. COMPARISON:  08/06/2015 FINDINGS: CT HEAD FINDINGS Bony calvarium is intact. Postsurgical changes are noted in the right orbit. Mild atrophic changes are noted. Mild chronic white matter ischemic change is seen. No findings to suggest acute hemorrhage, acute infarction or space-occupying mass lesion are noted. CT CERVICAL SPINE FINDINGS Seven cervical segments are well visualized. Vertebral body height is well maintained. Osteophytic changes noted from C3-C7. Facet hypertrophic changes are noted. No acute fracture or acute facet abnormality is noted. Surrounding soft tissue structures show evidence of a left-sided pleural effusion of relatively large size. IMPRESSION: CT of the head:  No acute intracranial abnormality noted. CT of the cervical spine: Degenerative changes without acute bony abnormality. Large left pleural effusion. Electronically Signed   By: Inez Catalina M.D.   On: 08/11/2015 16:53   Ct Lumbar Spine Wo Contrast  08/11/2015  CLINICAL DATA:  Status post unwitnessed fall today. Lower extremity and low back pain. Initial encounter. EXAM: CT LUMBAR SPINE WITHOUT CONTRAST TECHNIQUE: Multidetector CT imaging of the lumbar spine was performed without intravenous contrast administration. Multiplanar CT image reconstructions were also generated. COMPARISON:  None. FINDINGS: Vertebral body height and alignment are maintained. A sclerotic lesion in the L3 vertebral body to the right measures 2.8 cm AP by 2.1 cm transverse. Several punctate sclerotic lesions are also identified in the left ilium. Finally, a round sclerotic lesion is seen in the left side of the T11 vertebral body anterior to the pedicle measuring 1.3 cm AP by 1.1 cm transverse. Paraspinous structures demonstrate a small right and small to moderate left pleural effusion. Neither effusion is completely visualized. Bronchiectatic changes seen  the right lower lobe. The patient has extensive retroperitoneal lymphadenopathy. Index paraaortic nodal mass measures approximately 3.4 x 2.9 cm on image 90 tripped. Inter-aortocaval node on image 90 measures approximately 2.1 cm short axis dimension. The patient has mild to moderate left hydronephrosis. Cause for obstruction is not discretely identified but the left ureter is not seen in its entirety. There appears to be bladder wall thickening eccentric to the left. Two small nonobstructing stones are identified the left kidney. There is scattered facet degenerative disease lumbar spine. Minimal disc bulge and endplate spurring are noted at L3-4, L4-5 and L5-S1. IMPRESSION: Negative for fracture. Findings consistent with metastatic neoplastic process with extensive abdominal and pelvic lymphadenopathy and sclerotic bone lesions identified. There is left hydronephrosis with partial visualization of a lesion in the left side of the urinary bladder. Most likely  differential considerations are prostate or bladder carcinoma. These results were called by telephone at the time of interpretation on 08/11/2015 at 5:00 pm to Dr. Esaw Grandchild , who verbally acknowledged these results. Electronically Signed   By: Inge Rise M.D.   On: 08/11/2015 17:01   Dg Chest Port 1 View  08/11/2015  CLINICAL DATA:  Recent fall EXAM: PORTABLE CHEST - 1 VIEW COMPARISON:  08/06/2015 FINDINGS: Cardiac shadow is within normal limits. The lungs are well aerated bilaterally. No focal infiltrate or sizable effusion is noted. IMPRESSION: No acute abnormality noted. Electronically Signed   By: Inez Catalina M.D.   On: 08/11/2015 17:36   Dg Hips Bilat With Pelvis 2v  08/11/2015  CLINICAL DATA:  Status post fall. EXAM: DG HIP (WITH OR WITHOUT PELVIS) 2V BILAT COMPARISON:  None. FINDINGS: There is decreased bone mineralization. There is no evidence of hip fracture or dislocation. Mild to moderate bilateral hip joint osteoarthritic changes are seen, with joint space narrowing, subchondral sclerosis of the acetabula and osteophyte formation. IMPRESSION: No evidence of fracture or subluxation of bilateral hips. Electronically Signed   By: Fidela Salisbury M.D.   On: 08/11/2015 17:39     Assessment/Plan   ICD-9-CM ICD-10-CM   1. Physical deconditioning with recent fall at nursing home 799.3 R53.81   2. Prostate cancer metastatic to multiple sites [facility]) with left ureteral hydronephrosis 185 C61    199.0    3. FTT (failure to thrive) in adult 783.7 R62.7   4. Severe protein-calorie malnutrition ([place]) 262 E43   5. Other hydronephrosis 591 N13.39   6. History of CVA (cerebrovascular accident) V12.54 Z86.73   7. Dementia, without behavioral disturbance 294.20 F03.90   8. Hyperlipidemia 272.4 E78.5   9. Essential hypertension 401.9 I10   10.    CKD stage 3  Finish ceftin tx  Wound care to left leg skin tear  Fall precautions  Cont current meds as ordered  F/u with specialists as  scheduled  PT/OT/ST as ordered  Nutritional supplements as indicated  GOAL: short term rehab with potential for long term care. poor prognosis overall. Communicated with patient and nursing  Will follow  Aryan Bello S. Perlie Gold  [facility] [address] [place], [place] [address] [phone] Cell (Monday-Friday 8 AM - 5 PM) [phone] After 5 PM and follow prompts

## 2015-09-03 ENCOUNTER — Ambulatory Visit (HOSPITAL_COMMUNITY)
Admission: RE | Admit: 2015-09-03 | Discharge: 2015-09-03 | Disposition: A | Discharge status: Home / Self Care | Payer: Medicare Other | Source: Ambulatory Visit | Attending: Internal Medicine | Admitting: Internal Medicine

## 2015-09-03 ENCOUNTER — Ambulatory Visit (HOSPITAL_COMMUNITY): Payer: PRIVATE HEALTH INSURANCE

## 2015-09-03 DIAGNOSIS — Z029 Encounter for administrative examinations, unspecified: Secondary | ICD-10-CM | POA: Insufficient documentation

## 2015-09-03 NOTE — Procedures (Signed)
Objective Swallowing Evaluation: Type of Study: FEES-Fiberoptic Endoscopic Evaluation of Swallow  Patient Details  Name: Kent Mayo MRN: [id number] Date of Birth: 06/23/1927  Today's Date: 09/03/2015 Time: SLP Start Time (ACUTE ONLY): 1140-SLP Stop Time (ACUTE ONLY): 1154 SLP Time Calculation (min) (ACUTE ONLY): 14 min  Past Medical History:  Past Medical History  Diagnosis Date  . Diabetes mellitus without complication ([place])   . Hypertension   . Hearing difficulty of both ears     Bilateral hearing aids  . Blindness     Severe visual impairment OD, Blind OS  . Abnormality of gait   . Peripheral edema   . Glaucoma   . Dyslipidemia   . 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 UTI (urinary tract infection)   . Generalized muscle weakness   . Renal disorder   . Dysphagia   . Cognitive communication deficit   . Encephalopathy   . Urinary retention   . Neoplasm of prostate   . Renal failure   . Hyperlipidemia    Past Surgical History:  Past Surgical History  Procedure Laterality Date  . Eye surgery     HPI: 82 yo male who presents from SNF for FEES to assess readiness to advance from Dys 1 diet and honey thick liquids. Throat clearing noted with nectar thick liquids and coughing with thin liquids. PMH includes: dementia, hearing loss, blindness, cognitive communication deficit, poor intake, anterior spurring (C4-C5, C5-C6, C6-C7), CVA.  Subjective: pt alert, grimacing, HOH  Assessment / Plan / Recommendation  CHL IP CLINICAL IMPRESSIONS 09/03/2015  Therapy Diagnosis Moderate pharyngeal phase dysphagia;Severe pharyngeal phase dysphagia  Clinical Impression Pt has a mdoerate-severe pharyngeal dysphagia as observed across very limited trials, due to poor pt tolerance of scope. View was limited by standing, thick secretions, which became tinged with green following bolus administration. Diffuse pharyngeal residue noted after tsp of nectar thick liquids. Unable to visualize  clearly into the laryngeal vestibule and pt was not following commands to perform dry swalllows due to discomfort; however, frequent, wet throat clearing is likely indicative of penetration and/or aspiration of residue. As residue continues to mix with secretions and thins, his risk of aspiration increases. Given the amount of residue observed with nectar thick liquids, would anticipate a greater amount of residue with current diet of Dys 1 textures and honey thick liquids. Decreased throat clearing may be due to slower moving residuals, allowing for additional time to clear, but as that residue thins it will also pose an aspiration risk. Given that he likely has risk with all POs, pt may be appropriate for the water protocol and/or comfort feeds based on overall GOC. Will defer ultimate decision to family upon further discussion with primary providers.   Impact on safety and function Moderate aspiration risk;Severe aspiration risk      CHL IP TREATMENT RECOMMENDATION 09/03/2015  Treatment Recommendations Defer treatment plan to f/u with SLP     Prognosis 09/03/2015  Prognosis for Safe Diet Advancement Guarded  Barriers to Reach Goals --  Barriers/Prognosis Comment --    CHL IP DIET RECOMMENDATION 09/03/2015  SLP Diet Recommendations Dysphagia 1 (Puree) solids;Honey thick liquids  Liquid Administration via Cup;Spoon  Medication Administration Crushed with puree  Compensations Minimize environmental distractions;Slow rate;Small sips/bites;Multiple dry swallows after each bite/sip  Postural Changes Remain semi-upright after after feeds/meals (Comment)      CHL IP OTHER RECOMMENDATIONS 09/03/2015  Recommended Consults --  Oral Care Recommendations Oral care BID  Other Recommendations --  CHL IP FOLLOW UP RECOMMENDATIONS 09/03/2015  Follow up Recommendations Skilled Nursing facility      [facility] IP FREQUENCY AND DURATION 08/12/2015  Speech Therapy Frequency (ACUTE ONLY) min 1 x/week  Treatment  Duration 1 week           CHL IP ORAL PHASE 09/03/2015  Oral Phase Impaired  Oral - Pudding Teaspoon --  Oral - Pudding Cup --  Oral - Honey Teaspoon --  Oral - Honey Cup --  Oral - Nectar Teaspoon Weak lingual manipulation  Oral - Nectar Cup --  Oral - Nectar Straw --  Oral - Thin Teaspoon --  Oral - Thin Cup --  Oral - Thin Straw --  Oral - Puree --  Oral - Mech Soft --  Oral - Regular --  Oral - Multi-Consistency --  Oral - Pill --  Oral Phase - Comment --    CHL IP PHARYNGEAL PHASE 09/03/2015  Pharyngeal Phase Impaired  Pharyngeal- Pudding Teaspoon --  Pharyngeal --  Pharyngeal- Pudding Cup --  Pharyngeal --  Pharyngeal- Honey Teaspoon --  Pharyngeal --  Pharyngeal- Honey Cup --  Pharyngeal --  Pharyngeal- Nectar Teaspoon Pharyngeal residue - valleculae;Pharyngeal residue - pyriform;Pharyngeal residue - posterior pharnyx;Reduced anterior laryngeal mobility;Reduced laryngeal elevation;Reduced pharyngeal peristalsis;Other (Comment)  Pharyngeal --  Pharyngeal- Nectar Cup --  Pharyngeal --  Pharyngeal- Nectar Straw --  Pharyngeal --  Pharyngeal- Thin Teaspoon --  Pharyngeal --  Pharyngeal- Thin Cup --  Pharyngeal --  Pharyngeal- Thin Straw --  Pharyngeal --  Pharyngeal- Puree --  Pharyngeal --  Pharyngeal- Mechanical Soft --  Pharyngeal --  Pharyngeal- Regular --  Pharyngeal --  Pharyngeal- Multi-consistency --  Pharyngeal --  Pharyngeal- Pill --  Pharyngeal --  Pharyngeal Comment --     CHL IP CERVICAL ESOPHAGEAL PHASE 09/03/2015  Cervical Esophageal Phase (No Data)  Pudding Teaspoon --  Pudding Cup --  Honey Teaspoon --  Honey Cup --  Nectar Teaspoon --  Nectar Cup --  Nectar Straw --  Thin Teaspoon --  Thin Cup --  Thin Straw --  Puree --  Mechanical Soft --  Regular --  Multi-consistency --  Pill --  Cervical Esophageal Comment --    CHL IP GO 09/03/2015  Functional Assessment Tool Used skilled clinical judgment  Functional Limitations  Swallowing  Swallow Current Status [id number]) CL  Swallow Goal Status [id number]) CL  Swallow Discharge Status [id number]) CL  Motor Speech Current Status [id number]) (None)  Motor Speech Goal Status [id number]) (None)  Motor Speech Goal Status [id number]) (None)  Spoken Language Comprehension Current Status [id number]) (None)  Spoken Language Comprehension Goal Status [id number]) (None)  Spoken Language Comprehension Discharge Status [id number]) (None)  Spoken Language Expression Current Status [id number]) (None)  Spoken Language Expression Goal Status [id number]) (None)  Spoken Language Expression Discharge Status [id number]) (None)  Attention Current Status [id number]) (None)  Attention Goal Status [id number]) (None)  Attention Discharge Status [id number]) (None)  Memory Current Status [id number]) (None)  Memory Goal Status [id number]) (None)  Memory Discharge Status [id number]) (None)  Voice Current Status [id number]) (None)  Voice Goal Status [id number]) (None)  Voice Discharge Status [id number]) (None)  Other Speech-Language Pathology Functional Limitation [id number]) (None)  Other Speech-Language Pathology Functional Limitation Goal Status [id number]) (None)  Other Speech-Language Pathology Functional Limitation Discharge Status [phone]) (None)     Germain Osgood, M.A. CCC-SLP [phone]  Germain Osgood 09/03/2015, 12:18 PM

## 2015-09-09 ENCOUNTER — Emergency Department (HOSPITAL_COMMUNITY): Payer: Medicare Other

## 2015-09-09 ENCOUNTER — Inpatient Hospital Stay (HOSPITAL_COMMUNITY)
Admission: EM | Admit: 2015-09-09 | Discharge: 2015-10-02 | DRG: 871 | Disposition: E | Payer: Medicare Other | Attending: Internal Medicine | Admitting: Internal Medicine

## 2015-09-09 ENCOUNTER — Encounter (HOSPITAL_COMMUNITY): Payer: Self-pay | Admitting: *Deleted

## 2015-09-09 DIAGNOSIS — A419 Sepsis, unspecified organism: Secondary | ICD-10-CM | POA: Diagnosis present

## 2015-09-09 DIAGNOSIS — Z79818 Long term (current) use of other agents affecting estrogen receptors and estrogen levels: Secondary | ICD-10-CM

## 2015-09-09 DIAGNOSIS — E870 Hyperosmolality and hypernatremia: Secondary | ICD-10-CM | POA: Diagnosis present

## 2015-09-09 DIAGNOSIS — N179 Acute kidney failure, unspecified: Secondary | ICD-10-CM | POA: Diagnosis present

## 2015-09-09 DIAGNOSIS — Z66 Do not resuscitate: Secondary | ICD-10-CM | POA: Diagnosis not present

## 2015-09-09 DIAGNOSIS — L8990 Pressure ulcer of unspecified site, unspecified stage: Secondary | ICD-10-CM | POA: Insufficient documentation

## 2015-09-09 DIAGNOSIS — N171 Acute kidney failure with acute cortical necrosis: Secondary | ICD-10-CM | POA: Diagnosis not present

## 2015-09-09 DIAGNOSIS — R402332 Coma scale, best motor response, abnormal, at arrival to emergency department: Secondary | ICD-10-CM | POA: Diagnosis present

## 2015-09-09 DIAGNOSIS — H409 Unspecified glaucoma: Secondary | ICD-10-CM | POA: Diagnosis present

## 2015-09-09 DIAGNOSIS — F0390 Unspecified dementia without behavioral disturbance: Secondary | ICD-10-CM | POA: Diagnosis present

## 2015-09-09 DIAGNOSIS — R1310 Dysphagia, unspecified: Secondary | ICD-10-CM | POA: Diagnosis present

## 2015-09-09 DIAGNOSIS — R402112 Coma scale, eyes open, never, at arrival to emergency department: Secondary | ICD-10-CM | POA: Diagnosis present

## 2015-09-09 DIAGNOSIS — F411 Generalized anxiety disorder: Secondary | ICD-10-CM | POA: Diagnosis not present

## 2015-09-09 DIAGNOSIS — R0682 Tachypnea, not elsewhere classified: Secondary | ICD-10-CM | POA: Diagnosis present

## 2015-09-09 DIAGNOSIS — H540 Blindness, both eyes: Secondary | ICD-10-CM | POA: Diagnosis present

## 2015-09-09 DIAGNOSIS — R748 Abnormal levels of other serum enzymes: Secondary | ICD-10-CM

## 2015-09-09 DIAGNOSIS — E860 Dehydration: Secondary | ICD-10-CM | POA: Diagnosis present

## 2015-09-09 DIAGNOSIS — I959 Hypotension, unspecified: Secondary | ICD-10-CM | POA: Diagnosis present

## 2015-09-09 DIAGNOSIS — E11649 Type 2 diabetes mellitus with hypoglycemia without coma: Secondary | ICD-10-CM | POA: Diagnosis not present

## 2015-09-09 DIAGNOSIS — Z79899 Other long term (current) drug therapy: Secondary | ICD-10-CM | POA: Diagnosis not present

## 2015-09-09 DIAGNOSIS — R7989 Other specified abnormal findings of blood chemistry: Secondary | ICD-10-CM | POA: Diagnosis present

## 2015-09-09 DIAGNOSIS — N183 Chronic kidney disease, stage 3 (moderate): Secondary | ICD-10-CM | POA: Diagnosis present

## 2015-09-09 DIAGNOSIS — E785 Hyperlipidemia, unspecified: Secondary | ICD-10-CM | POA: Diagnosis present

## 2015-09-09 DIAGNOSIS — C7951 Secondary malignant neoplasm of bone: Secondary | ICD-10-CM | POA: Diagnosis present

## 2015-09-09 DIAGNOSIS — Z8673 Personal history of transient ischemic attack (TIA), and cerebral infarction without residual deficits: Secondary | ICD-10-CM

## 2015-09-09 DIAGNOSIS — J9600 Acute respiratory failure, unspecified whether with hypoxia or hypercapnia: Secondary | ICD-10-CM | POA: Diagnosis present

## 2015-09-09 DIAGNOSIS — G893 Neoplasm related pain (acute) (chronic): Secondary | ICD-10-CM | POA: Diagnosis not present

## 2015-09-09 DIAGNOSIS — G92 Toxic encephalopathy: Secondary | ICD-10-CM | POA: Diagnosis present

## 2015-09-09 DIAGNOSIS — N1330 Unspecified hydronephrosis: Secondary | ICD-10-CM | POA: Diagnosis present

## 2015-09-09 DIAGNOSIS — R4182 Altered mental status, unspecified: Secondary | ICD-10-CM | POA: Diagnosis present

## 2015-09-09 DIAGNOSIS — C61 Malignant neoplasm of prostate: Secondary | ICD-10-CM | POA: Diagnosis present

## 2015-09-09 DIAGNOSIS — Z515 Encounter for palliative care: Secondary | ICD-10-CM | POA: Diagnosis not present

## 2015-09-09 DIAGNOSIS — E119 Type 2 diabetes mellitus without complications: Secondary | ICD-10-CM

## 2015-09-09 DIAGNOSIS — R400 Somnolence: Secondary | ICD-10-CM

## 2015-09-09 DIAGNOSIS — E872 Acidosis, unspecified: Secondary | ICD-10-CM | POA: Diagnosis present

## 2015-09-09 DIAGNOSIS — C787 Secondary malignant neoplasm of liver and intrahepatic bile duct: Secondary | ICD-10-CM | POA: Diagnosis present

## 2015-09-09 DIAGNOSIS — N492 Inflammatory disorders of scrotum: Secondary | ICD-10-CM

## 2015-09-09 DIAGNOSIS — J9601 Acute respiratory failure with hypoxia: Secondary | ICD-10-CM | POA: Diagnosis present

## 2015-09-09 DIAGNOSIS — R945 Abnormal results of liver function studies: Secondary | ICD-10-CM | POA: Diagnosis present

## 2015-09-09 DIAGNOSIS — Z87891 Personal history of nicotine dependence: Secondary | ICD-10-CM

## 2015-09-09 DIAGNOSIS — N493 Fournier gangrene: Secondary | ICD-10-CM | POA: Diagnosis present

## 2015-09-09 DIAGNOSIS — R627 Adult failure to thrive: Secondary | ICD-10-CM | POA: Diagnosis present

## 2015-09-09 DIAGNOSIS — R0600 Dyspnea, unspecified: Secondary | ICD-10-CM | POA: Insufficient documentation

## 2015-09-09 DIAGNOSIS — I129 Hypertensive chronic kidney disease with stage 1 through stage 4 chronic kidney disease, or unspecified chronic kidney disease: Secondary | ICD-10-CM | POA: Diagnosis present

## 2015-09-09 DIAGNOSIS — N189 Chronic kidney disease, unspecified: Secondary | ICD-10-CM

## 2015-09-09 DIAGNOSIS — C7971 Secondary malignant neoplasm of right adrenal gland: Secondary | ICD-10-CM | POA: Diagnosis present

## 2015-09-09 DIAGNOSIS — R339 Retention of urine, unspecified: Secondary | ICD-10-CM | POA: Diagnosis present

## 2015-09-09 DIAGNOSIS — R402222 Coma scale, best verbal response, incomprehensible words, at arrival to emergency department: Secondary | ICD-10-CM | POA: Diagnosis present

## 2015-09-09 DIAGNOSIS — C7972 Secondary malignant neoplasm of left adrenal gland: Secondary | ICD-10-CM | POA: Diagnosis present

## 2015-09-09 DIAGNOSIS — H9190 Unspecified hearing loss, unspecified ear: Secondary | ICD-10-CM | POA: Diagnosis present

## 2015-09-09 DIAGNOSIS — E1122 Type 2 diabetes mellitus with diabetic chronic kidney disease: Secondary | ICD-10-CM | POA: Diagnosis present

## 2015-09-09 DIAGNOSIS — R6520 Severe sepsis without septic shock: Secondary | ICD-10-CM | POA: Diagnosis present

## 2015-09-09 DIAGNOSIS — I10 Essential (primary) hypertension: Secondary | ICD-10-CM | POA: Diagnosis present

## 2015-09-09 DIAGNOSIS — N390 Urinary tract infection, site not specified: Secondary | ICD-10-CM | POA: Diagnosis present

## 2015-09-09 LAB — CBC WITH DIFFERENTIAL/PLATELET
BASOS PCT: 0 %
Basophils Absolute: 0 10*3/uL (ref 0.0–0.1)
EOS ABS: 0 10*3/uL (ref 0.0–0.7)
EOS PCT: 0 %
HEMATOCRIT: 44.4 % (ref 39.0–52.0)
Hemoglobin: 14.3 g/dL (ref 13.0–17.0)
Lymphocytes Relative: 4 %
Lymphs Abs: 1 10*3/uL (ref 0.7–4.0)
MCH: 29.5 pg (ref 26.0–34.0)
MCHC: 32.2 g/dL (ref 30.0–36.0)
MCV: 91.5 fL (ref 78.0–100.0)
MONO ABS: 2.2 10*3/uL — AB (ref 0.1–1.0)
Monocytes Relative: 9 %
NEUTROS ABS: 21.4 10*3/uL — AB (ref 1.7–7.7)
Neutrophils Relative %: 87 %
PLATELETS: 341 10*3/uL (ref 150–400)
RBC: 4.85 MIL/uL (ref 4.22–5.81)
RDW: 15.8 % — ABNORMAL HIGH (ref 11.5–15.5)
WBC MORPHOLOGY: INCREASED
WBC: 24.6 10*3/uL — ABNORMAL HIGH (ref 4.0–10.5)

## 2015-09-09 LAB — COMPREHENSIVE METABOLIC PANEL
ALBUMIN: 2.5 g/dL — AB (ref 3.5–5.0)
ALK PHOS: 199 U/L — AB (ref 38–126)
ALT: 15 U/L — AB (ref 17–63)
AST: 35 U/L (ref 15–41)
Anion gap: 25 — ABNORMAL HIGH (ref 5–15)
BUN: 64 mg/dL — ABNORMAL HIGH (ref 6–20)
CALCIUM: 10.6 mg/dL — AB (ref 8.9–10.3)
CHLORIDE: 106 mmol/L (ref 101–111)
CO2: 16 mmol/L — AB (ref 22–32)
CREATININE: 2.22 mg/dL — AB (ref 0.61–1.24)
GFR calc Af Amer: 29 mL/min — ABNORMAL LOW (ref >60)
GFR calc non Af Amer: 25 mL/min — ABNORMAL LOW (ref >60)
GLUCOSE: 167 mg/dL — AB (ref 65–99)
Potassium: 4 mmol/L (ref 3.5–5.1)
SODIUM: 147 mmol/L — AB (ref 135–145)
Total Bilirubin: 1.4 mg/dL — ABNORMAL HIGH (ref 0.3–1.2)
Total Protein: 8 g/dL (ref 6.5–8.1)

## 2015-09-09 LAB — I-STAT CG4 LACTIC ACID, ED
Lactic Acid, Venous: 10 mmol/L (ref 0.5–2.0)
Lactic Acid, Venous: 8 mmol/L (ref 0.5–2.0)

## 2015-09-09 MED ORDER — VANCOMYCIN HCL IN DEXTROSE 1-5 GM/200ML-% IV SOLN: 1000.0000 mg | Freq: Once | INTRAVENOUS | Status: DC

## 2015-09-09 MED ORDER — SODIUM CHLORIDE 0.9 % IV SOLN
INTRAVENOUS | Status: DC
  Administered 2015-09-09 – 2015-09-10 (×3): via INTRAVENOUS

## 2015-09-09 MED ORDER — IPRATROPIUM-ALBUTEROL 0.5-2.5 (3) MG/3ML IN SOLN
3.0000 mL | Freq: Four times a day (QID) | RESPIRATORY_TRACT | Status: DC
  Administered 2015-09-10 – 2015-09-12 (×10): 3 mL via RESPIRATORY_TRACT
  Filled 2015-09-09 (×11): qty 3

## 2015-09-09 MED ORDER — LORAZEPAM 2 MG/ML IJ SOLN: 0.5000 mg | INTRAMUSCULAR | Status: DC | PRN

## 2015-09-09 MED ORDER — SODIUM CHLORIDE 0.9 % IV BOLUS (SEPSIS)
1000.0000 mL | Freq: Once | INTRAVENOUS | Status: AC
  Administered 2015-09-09: 1000 mL via INTRAVENOUS

## 2015-09-09 MED ORDER — PANTOPRAZOLE SODIUM 40 MG IV SOLR
40.0000 mg | INTRAVENOUS | Status: DC
  Administered 2015-09-09 – 2015-09-10 (×2): 40 mg via INTRAVENOUS
  Filled 2015-09-09 (×2): qty 40

## 2015-09-09 MED ORDER — ALBUTEROL SULFATE (2.5 MG/3ML) 0.083% IN NEBU: 2.5000 mg | INHALATION_SOLUTION | RESPIRATORY_TRACT | Status: DC | PRN

## 2015-09-09 MED ORDER — VANCOMYCIN HCL IN DEXTROSE 750-5 MG/150ML-% IV SOLN
750.0000 mg | INTRAVENOUS | Status: DC
  Administered 2015-09-10 – 2015-09-11 (×2): 750 mg via INTRAVENOUS
  Filled 2015-09-09 (×3): qty 150

## 2015-09-09 MED ORDER — PIPERACILLIN-TAZOBACTAM 3.375 G IVPB 30 MIN
3.3750 g | Freq: Once | INTRAVENOUS | Status: AC
  Administered 2015-09-09: 3.375 g via INTRAVENOUS
  Filled 2015-09-09: qty 50

## 2015-09-09 MED ORDER — PIPERACILLIN-TAZOBACTAM 3.375 G IVPB
3.3750 g | Freq: Three times a day (TID) | INTRAVENOUS | Status: DC
  Administered 2015-09-10 – 2015-09-12 (×6): 3.375 g via INTRAVENOUS
  Filled 2015-09-09 (×8): qty 50

## 2015-09-09 MED ORDER — SODIUM CHLORIDE 0.9 % IV BOLUS (SEPSIS)
500.0000 mL | Freq: Once | INTRAVENOUS | Status: AC
  Administered 2015-09-09: 500 mL via INTRAVENOUS

## 2015-09-09 MED ORDER — VANCOMYCIN HCL 10 G IV SOLR
1500.0000 mg | Freq: Once | INTRAVENOUS | Status: AC
  Administered 2015-09-09: 1500 mg via INTRAVENOUS
  Filled 2015-09-09: qty 1500

## 2015-09-09 NOTE — ED Notes (Signed)
2nd blood culture collected at 1647

## 2015-09-09 NOTE — ED Notes (Signed)
Attempted to in and out cath pt but scrotum is extremely swollen and I believe prostate is as well so the attempt was unsuccessful. Cassie RN and Dr.Yao notified

## 2015-09-09 NOTE — ED Notes (Signed)
EMS-patient from [place]. Decreased LOC today. GCS 7. CBG 140s. Patient transferred to ED for decreased O2 sats, tachypnea, and tachycardia. Patient with hx of cancer, per staff patient does have dementia but is usually alert and ambulated with assistance.

## 2015-09-09 NOTE — H&P (Signed)
[facility] History and Physical  Kent Mayo [id number] DOB: 24-Dec-1926 DOA: 09/29/2015  Referring physician: Heriberto Antigua, MD PCP: No primary care provider on file.   Chief Complaint: Altered Mental Status  HPI: Kent Mayo is a 82 y.o. male with below past medical history  who was brought from [facility] a day [facility] due to decreased level of consciousness today.  The patient was evaluated at the nursing facility, he was found to be hypoxic tachypneic and tachycardic. Patient has a history of dementia, but is usually alert, able to have limited interaction with the staff and other residents, and ambulate without assistance. No further information is available at this time.  In the emergency department, patient was requiring nonrebreather mask supplemental oxygen. Workup shows a lactic acidosis, leukocytosis, worsening renal function, hypernatremia, abnormal LFTs and abnormal chest radiograph. His brother Kent Mayo, discussed advanced directives with Dr. Lanetta Inch and make the patient DO NOT RESUSCITATE/DO NOT INTUBATE. I confirmed this with his brother via telephone call.   Review of Systems:  Unable to review due to patient's mental status.   Past Medical History  Diagnosis Date  . Diabetes mellitus without complication ([place])   . Hypertension   . Hearing difficulty of both ears     Bilateral hearing aids  . Blindness     Severe visual impairment OD, Blind OS  . Abnormality of gait   . Peripheral edema   . Glaucoma   . Dyslipidemia   . Nontraumatic cerebral hemorrhage ([place])   . UTI (urinary tract infection)   . Generalized muscle weakness   . Renal disorder   . Dysphagia   . Cognitive communication deficit   . Encephalopathy   . Urinary retention   . Neoplasm of prostate   . Renal failure   . Hyperlipidemia    Past Surgical History  Procedure Laterality Date  . Eye surgery     Social History:  reports that he has quit smoking.  He has never used smokeless tobacco. He reports that he does not drink alcohol or use illicit drugs.  No Known Allergies  Family History  Problem Relation Age of Onset  . Hypertension Mother   . Hypertension Father   . Diabetes Mellitus II Brother     Prior to Admission medications   Medication Sig Start Date End Date Taking? Authorizing Provider  acetaminophen (TYLENOL) 650 MG CR tablet Take 650 mg by mouth every 8 (eight) hours.    Historical Provider, MD  atorvastatin (LIPITOR) 10 MG tablet Take 10 mg by mouth at bedtime.    Historical Provider, MD  brimonidine (ALPHAGAN) 0.2 % ophthalmic solution Place 1 drop into both eyes 3 (three) times daily.    Historical Provider, MD  carvedilol (COREG) 6.25 MG tablet Take 6.25 mg by mouth 2 (two) times daily. With food. 08/23/14   Historical Provider, MD  donepezil (ARICEPT) 10 MG tablet Take 10 mg by mouth 2 (two) times daily.     Historical Provider, MD  dorzolamide-timolol (COSOPT) 22.3-6.8 MG/ML ophthalmic solution Place 1 drop into the right eye 2 (two) times daily.    Historical Provider, MD  guaiFENesin (MUCINEX) 600 MG 12 hr tablet Take 600 mg by mouth 2 (two) times daily as needed for to loosen phlegm.    Historical Provider, MD  lidocaine (LIDODERM) 5 % Place 1 patch onto the skin daily. Remove & Discard patch within 12 hours or as directed by MD 08/16/15   Barton Dubois, MD  LORazepam (ATIVAN) 0.5 MG tablet Take 1 tablet (0.5 mg total) by mouth every 12 (twelve) hours as needed for anxiety (insomnia). 08/16/15   Barton Dubois, MD  megestrol (MEGACE) 400 MG/10ML suspension Take 10 mLs (400 mg total) by mouth 2 (two) times daily. 08/06/15   Orlie Dakin, MD  memantine (NAMENDA) 10 MG tablet Take 10 mg by mouth 2 (two) times daily.    Historical Provider, MD  mirtazapine (REMERON) 15 MG tablet Take 15 mg by mouth at bedtime.    Historical Provider, MD  NUTRITIONAL SUPPLEMENT LIQD Take 1 Can by mouth 3 (three) times daily. Between meals. "Med  Pass."    Historical Provider, MD  oxyCODONE (OXY IR/ROXICODONE) 5 MG immediate release tablet Take 1 tablet (5 mg total) by mouth every 6 (six) hours as needed for severe pain (please give to patient prior to bathing / cleaning / physical therapy.). 08/16/15   Barton Dubois, MD  senna-docusate (SENOKOT-S) 8.6-50 MG per tablet Take 1 tablet by mouth at bedtime as needed for mild constipation. 03/22/15   [name], MD  verapamil (CALAN-SR) 240 MG CR tablet Take 240 mg by mouth daily.     Historical Provider, MD   Physical Exam: Filed Vitals:   09/06/2015 1800 09/11/2015 1830 09/29/2015 1900 09/14/2015 1915  BP: 105/59 106/54 116/61 130/60  Pulse: 75 73 72 77  Temp:      TempSrc:      Resp: 28 38 38 44  Weight:      SpO2: 97% 100% 100% 99%    Wt Readings from Last 3 Encounters:  09/29/2015 80 kg (176 lb 5.9 oz)  08/16/15 79.969 kg (176 lb 4.8 oz)  04/01/15 103.4 kg (227 lb 15.3 oz)    General:  Obtunded, unresponsive to verbal stimuli. Eyes: Left eye coloboma, right eye pupil reactive to light. ENT:  lips and oral mucosa are dry.  Neck: no LAD, masses or thyromegaly Cardiovascular: RRR, no m/r/g. No LE edema. Telemetry: SR, no arrhythmias  Respiratory: Tachypneic, requiring nonrebreather oxygen supplementation, decreased breath sounds bilaterally, particularly at the bases, bibasilar rales, bilateral rhonchi. Abdomen: Bowel sounds positive, soft, nontender. Skin: Skin looks dry, particularly on lower extremities. Musculoskeletal: Decreased muscle tone BUE/BLE Psychiatric: Nonverbal. Neurologic: Obtunded, does not respond to verbal stimuli, responds to noxious stimuli.           Labs on Admission:  Basic Metabolic Panel:  Recent Labs Lab 09/04/2015 1626  NA 147*  K 4.0  CL 106  CO2 16*  GLUCOSE 167*  BUN 64*  CREATININE 2.22*  CALCIUM 10.6*   Liver Function Tests:  Recent Labs Lab 09/08/2015 1626  AST 35  ALT 15*  ALKPHOS 199*  BILITOT 1.4*  PROT 8.0  ALBUMIN 2.5*    CBC:  Recent Labs Lab 09/07/2015 1626  WBC 24.6*  NEUTROABS 21.4*  HGB 14.3  HCT 44.4  MCV 91.5  PLT 341    Radiological Exams on Admission: Ct Head Wo Contrast  09/28/2015  CLINICAL DATA:  Decreased level of consciousness. Decreased O2 sats. Tachypnea, tachycardia. EXAM: CT HEAD WITHOUT CONTRAST TECHNIQUE: Contiguous axial images were obtained from the base of the skull through the vertex without intravenous contrast. COMPARISON:  08/13/2015 FINDINGS: There is atrophy and chronic small vessel disease changes. No acute intracranial abnormality. Specifically, no hemorrhage, hydrocephalus, mass lesion, acute infarction, or significant intracranial injury. No acute calvarial abnormality. Visualized paranasal sinuses and mastoids clear. Orbital soft tissues unremarkable. IMPRESSION: No acute intracranial abnormality. Atrophy, chronic microvascular disease. Electronically  Signed   By: Rolm Baptise M.D.   On: 09/22/2015 18:41   Dg Chest Portable 1 View  09/24/2015  CLINICAL DATA:  Altered mental status.  Rapid respirations. EXAM: PORTABLE CHEST 1 VIEW COMPARISON:  08/11/2015 chest x-ray.  Chest CT 08/12/2015. FINDINGS: COPD with hyperinflation. Normal heart size. LEFT lower lobe atelectasis. LEFT pleural effusion with or without consolidation. Worsening aeration from priors. Small nodules noted on previous chest CT not visible. IMPRESSION: LEFT lower lobe atelectasis with or without consolidation and LEFT pleural effusion. Slight worsening aeration from priors. Electronically Signed   By: Staci Righter M.D.   On: 09/27/2015 17:18   echocardiogram 02/23/2013 ------------------------------------------------------------ LV EF: 55% -  60%  ------------------------------------------------------------ Indications:   Presyncope 780.2.  ------------------------------------------------------------ History:  PMH: Dizziness. Risk factors: Hypertension. Diabetes mellitus.  Dyslipidemia.  ------------------------------------------------------------ Study Conclusions  - Left ventricle: The cavity size was normal. Wall thickness was increased in a pattern of severe LVH. Systolic function was normal. The estimated ejection fraction was in the range of 55% to 60%. - Left atrium: The atrium was mildly dilated. - Atrial septum: No defect or patent foramen ovale was identified.   EKG: Independently reviewed. Vent. rate 81 BPM PR interval 157 ms QRS duration 94 ms QT/QTc 399/463 ms P-R-T axes 42 59 84 Sinus rhythm Supraventricular bigeminy Minimal ST depression, lateral leads No significant change since previous EKG.  Assessment/Plan Principal Problem:   Severe sepsis ([place]) Admit to a stepdown for closer monitoring. Continue IV hydration. Monitor BUN/creatinine and electrolytes. Continue IV vancomycin and Zosyn. Follow-up blood cultures and sensitivity.  Active Problems:   Acute respiratory failure. Continue nonrebreather mask oxygen supplementation. BiPAP ventilation overnight in the stepdown unit. Bronchodilators as needed.    Renal failure (ARF), acute on chronic (HCC) Continue IV hydration. Follow-up BUN/creatinine and electrolytes. Check urine spot lytes and random creatinine. Consider expanding workup or consulting nephrology if no improvement.    Lactic acidosis Continue IV hydration and and sepsis treatment. Monitor lactic acid level.    Hypernatremia. Continue IV hydration and monitor sodium level.    Abnormal LFTs. Continue current therapy and monitor LFTs.      Altered mental status   History of CVA (cerebrovascular accident)   Dementia Continue treatment of sepsis, respiratory and renal failure. Supportive care. Given the advanced age of the patient and multiorgan involvement, prognosis is poor.      HTN (hypertension) Hold verapamil for now since the patient's blood pressure has been low. Resume once the  patient is tolerating oral intake and his blood pressure is better..    Diabetes mellitus ([place]) Patient is currently nothing by mouth due to altered mental status. CBG monitoring every 6 hours. Resume diet once the patient's mental status improves.    Code Status: DO NOT RESUSCITATE/DO NOT INTUBATE. DVT Prophylaxis: SCDs. Family Communication:  Lainez,Julius Brother [phone]  [phone]  Disposition Plan: Admit to a stepdown for IV hydration, antibiotic therapy and close monitoring.   Time spent: Over 70 minutes were spent in approximately admission.   Reubin Milan, M.D. [facility] Pager [phone].

## 2015-09-09 NOTE — ED Provider Notes (Signed)
CSN: [id number]     Arrival date & time 09/12/2015  1610 History   First MD Initiated Contact with Patient 09/23/2015 1633     Chief Complaint  Patient presents with  . Altered Mental Status     (Consider location/radiation/quality/duration/timing/severity/associated sxs/prior Treatment) Patient is a 82 y.o. male presenting with altered mental status. The history is provided by the EMS personnel. The history is limited by the condition of the patient.  Altered Mental Status Presenting symptoms: disorientation, lethargy and unresponsiveness   Severity:  Severe Most recent episode:  Today Episode history:  Unable to specify Duration: today. Timing:  Constant Progression:  Worsening Chronicity:  New Context: dementia and nursing home resident   Context comment:  Hx of prostate cancer. Usually alert and ambulatory with assist   Past Medical History  Diagnosis Date  . Diabetes mellitus without complication ([place])   . Hypertension   . Hearing difficulty of both ears     Bilateral hearing aids  . Blindness     Severe visual impairment OD, Blind OS  . Abnormality of gait   . Peripheral edema   . Glaucoma   . Dyslipidemia   . Nontraumatic cerebral hemorrhage ([place])   . UTI (urinary tract infection)   . Generalized muscle weakness   . Renal disorder   . Dysphagia   . Cognitive communication deficit   . Encephalopathy   . Urinary retention   . Neoplasm of prostate   . Renal failure   . Hyperlipidemia    Past Surgical History  Procedure Laterality Date  . Eye surgery     Family History  Problem Relation Age of Onset  . Hypertension Mother   . Hypertension Father   . Diabetes Mellitus II Brother    Social History  Substance Use Topics  . Smoking status: Former Research scientist (life sciences)  . Smokeless tobacco: Never Used  . Alcohol Use: No    Review of Systems  Unable to perform ROS: Patient unresponsive      Allergies  Review of patient's allergies indicates no known  allergies.  Home Medications   Prior to Admission medications   Medication Sig Start Date End Date Taking? Authorizing Provider  acetaminophen (TYLENOL) 650 MG CR tablet Take 650 mg by mouth every 8 (eight) hours.    Historical Provider, MD  atorvastatin (LIPITOR) 10 MG tablet Take 10 mg by mouth at bedtime.    Historical Provider, MD  brimonidine (ALPHAGAN) 0.2 % ophthalmic solution Place 1 drop into both eyes 3 (three) times daily.    Historical Provider, MD  carvedilol (COREG) 6.25 MG tablet Take 6.25 mg by mouth 2 (two) times daily. With food. 08/23/14   Historical Provider, MD  donepezil (ARICEPT) 10 MG tablet Take 10 mg by mouth 2 (two) times daily.     Historical Provider, MD  dorzolamide-timolol (COSOPT) 22.3-6.8 MG/ML ophthalmic solution Place 1 drop into the right eye 2 (two) times daily.    Historical Provider, MD  guaiFENesin (MUCINEX) 600 MG 12 hr tablet Take 600 mg by mouth 2 (two) times daily as needed for to loosen phlegm.    Historical Provider, MD  lidocaine (LIDODERM) 5 % Place 1 patch onto the skin daily. Remove & Discard patch within 12 hours or as directed by MD 08/16/15   Barton Dubois, MD  LORazepam (ATIVAN) 0.5 MG tablet Take 1 tablet (0.5 mg total) by mouth every 12 (twelve) hours as needed for anxiety (insomnia). 08/16/15   Barton Dubois, MD  megestrol (MEGACE) 400  MG/10ML suspension Take 10 mLs (400 mg total) by mouth 2 (two) times daily. 08/06/15   Orlie Dakin, MD  memantine (NAMENDA) 10 MG tablet Take 10 mg by mouth 2 (two) times daily.    Historical Provider, MD  mirtazapine (REMERON) 15 MG tablet Take 15 mg by mouth at bedtime.    Historical Provider, MD  NUTRITIONAL SUPPLEMENT LIQD Take 1 Can by mouth 3 (three) times daily. Between meals. "Med Pass."    Historical Provider, MD  oxyCODONE (OXY IR/ROXICODONE) 5 MG immediate release tablet Take 1 tablet (5 mg total) by mouth every 6 (six) hours as needed for severe pain (please give to patient prior to bathing /  cleaning / physical therapy.). 08/16/15   Barton Dubois, MD  senna-docusate (SENOKOT-S) 8.6-50 MG per tablet Take 1 tablet by mouth at bedtime as needed for mild constipation. 03/22/15   [name], MD  verapamil (CALAN-SR) 240 MG CR tablet Take 240 mg by mouth daily.     Historical Provider, MD   BP 88/64 mmHg  Pulse 84  Resp 42  SpO2 100% Physical Exam  Constitutional: He appears cachectic. He has a sickly appearance. Face mask in place.  HENT:  Head: Normocephalic and atraumatic.  Eyes:  Left eye with hazy cornea and malformed pupil. Right eye with pupil 24mm and reactive to light  Neck: Normal range of motion. No JVD present.  Cardiovascular: Normal rate, regular rhythm, normal heart sounds and intact distal pulses.   Pulmonary/Chest:  Scattered rhonchi with crackles right base  Abdominal: Soft. He exhibits no distension.  Musculoskeletal: He exhibits no edema.  Neurological: He is unresponsive. He displays no tremor. No cranial nerve deficit. He displays no seizure activity. GCS eye subscore is 1. GCS verbal subscore is 2. GCS motor subscore is 3.  Skin: Skin is warm and dry. No rash noted. No erythema.    ED Course  Procedures (including critical care time) Labs Review Labs Reviewed  CULTURE, BLOOD (ROUTINE X 2)  CULTURE, BLOOD (ROUTINE X 2)  URINE CULTURE  COMPREHENSIVE METABOLIC PANEL  CBC WITH DIFFERENTIAL/PLATELET  URINALYSIS, ROUTINE W REFLEX MICROSCOPIC (NOT AT [facility])  I-STAT CG4 LACTIC ACID, ED    Imaging Review No results found. I have personally reviewed and evaluated these images and lab results as part of my medical decision-making.   EKG Interpretation   Date/Time:  Monday September 09 2015 16:22:51 EST Ventricular Rate:  81 PR Interval:  157 QRS Duration: 94 QT Interval:  399 QTC Calculation: 463 R Axis:   59 Text Interpretation:  Sinus rhythm Supraventricular bigeminy Minimal ST  depression, lateral leads No significant change since last tracing   Confirmed by YAO  MD, DAVID ([id number]) on 09/20/2015 4:24:28 PM      MDM   Final diagnoses:  None  Angiocath insertion Performed by: Heriberto Antigua Preparation: Patient was prepped and draped in the usual sterile fashion.  Vein Location: right upper ext Ultrasound Guided  Gauge: 18ga  Normal blood return and flush without difficulty Patient tolerance: Patient tolerated the procedure well with no immediate complications.    EMERGENCY DEPARTMENT [place] CARDIAC EXAM "Study: Limited Ultrasound of the heart and pericardium"  INDICATIONS:Hypotension Multiple views of the heart and pericardium were obtained in real-time with a multi-frequency probe.  PERFORMED [id number]  IMAGES ARCHIVED?: No  FINDINGS: No pericardial effusion, Hyperdynamic contractility and IVC flat  LIMITATIONS:  Emergent procedure  VIEWS USED: Subcostal 4 chamber  INTERPRETATION: Cardiac activity present, Pericardial effusioin absent and Probable low CVP  Pt is a 55yoM here from his facility for concern of AMS. Upon arrival, GCS is 7 but o2 sats maintained in high 90s and patient still will gag/cough. Given history of dementia and metastatic cancer will attempt to contact family prior to intubation. Found to be hypotensive and febrile. 30cc/kg ivf given, atx given for severe sepsis. Cardiac [place] as above with evidence of volume depletion. BP and mental status improved with IVF. His brother who is the medical decision maker wishes to make the pt dnr/dni. Labs reveal significant lactic acidosis and leukocytosis. ua with evidence of uti. cxr with possible LLL pna. Given patients code states, will be admitted to stepdown for further management and evaluation.     Heriberto Antigua, MD 09/10/15 1138  Wandra Arthurs, MD 09/10/15 [phone]

## 2015-09-09 NOTE — ED Notes (Signed)
Blood cultures drawn prior to IV antibiotic start.

## 2015-09-09 NOTE — Consult Note (Signed)
Pharmacy Antibiotic Note  Kent Mayo is a 82 y.o. male admitted on 09/25/2015 with sepsis.  Pharmacy has been consulted for vanc/zosyn dosing.  Pt comes from [place]. Recent admission 08/11/15. History of Enterococcus (ampicillin sensitive) in the urine in December.   SCr elevated to 2.22 from baseline (BL ~ 1.6) w/ creatinine clearance ~27 ml/min.  Plan: Vanc 1500 mg IV x1 followed by vanc 750 mg IV q24h Zosyn 3.375 g IV q8h Monitor renal function, cultures, VT prn, LOT  Temp (24hrs), Avg:101 F (38.3 C), Min:101 F (38.3 C), Max:101 F (38.3 C)   No Known Allergies  Antimicrobials this admission: Vanc 2/6 >>  Zosyn 2/6 >>   Dose adjustments this admission: N/a  Microbiology results: 2/6 BCx: sent 2/6 UCx:    Thank you for allowing pharmacy to be a part of this patient's care.  Joya San, PharmD Clinical Pharmacy Resident Pager # [phone] 09/18/2015 4:52 PM    Pharmacy Code Sepsis Protocol  Time of code sepsis page: 16:28 [x]  Antibiotics delivered at 16:46  Were antibiotics ordered at the time of the code sepsis page? No Was it required to contact the physician? [x]  Physician not contacted []  Physician contacted to order antibiotics for code sepsis []  Physician contacted to recommend changing antibiotics  Pharmacy consulted for: vanc/zosyn  Anti-infectives    Start     Dose/Rate Route Frequency Ordered Stop   09/08/2015 1645  piperacillin-tazobactam (ZOSYN) IVPB 3.375 g     3.375 g 100 mL/hr over 30 Minutes Intravenous  Once 09/08/2015 1637     09/26/2015 1645  vancomycin (VANCOCIN) IVPB 1000 mg/200 mL premix  Status:  Discontinued     1,000 mg 200 mL/hr over 60 Minutes Intravenous  Once 09/28/2015 1637 09/20/2015 1640   09/16/2015 1645  vancomycin (VANCOCIN) 1,500 mg in sodium chloride 0.9 % 500 mL IVPB     1,500 mg 250 mL/hr over 120 Minutes Intravenous  Once 09/24/2015 1641          Nurse education provided: [x]  Minutes left to administer antibiotics  to achieve 1 hour goal [x]  Correct order of antibiotic administration [x]  Antibiotic Y-site compatibilities     Joya San, PharmD Clinical Pharmacy Resident Pager # [phone] 10/01/2015 4:48 PM

## 2015-09-10 ENCOUNTER — Inpatient Hospital Stay (HOSPITAL_COMMUNITY): Payer: Medicare Other

## 2015-09-10 DIAGNOSIS — N493 Fournier gangrene: Secondary | ICD-10-CM

## 2015-09-10 DIAGNOSIS — N171 Acute kidney failure with acute cortical necrosis: Secondary | ICD-10-CM

## 2015-09-10 DIAGNOSIS — C7951 Secondary malignant neoplasm of bone: Secondary | ICD-10-CM

## 2015-09-10 DIAGNOSIS — A419 Sepsis, unspecified organism: Principal | ICD-10-CM

## 2015-09-10 DIAGNOSIS — L8990 Pressure ulcer of unspecified site, unspecified stage: Secondary | ICD-10-CM | POA: Insufficient documentation

## 2015-09-10 DIAGNOSIS — N492 Inflammatory disorders of scrotum: Secondary | ICD-10-CM

## 2015-09-10 DIAGNOSIS — Z515 Encounter for palliative care: Secondary | ICD-10-CM

## 2015-09-10 DIAGNOSIS — C61 Malignant neoplasm of prostate: Secondary | ICD-10-CM

## 2015-09-10 DIAGNOSIS — R6520 Severe sepsis without septic shock: Secondary | ICD-10-CM

## 2015-09-10 LAB — URINALYSIS, ROUTINE W REFLEX MICROSCOPIC
Glucose, UA: NEGATIVE mg/dL
KETONES UR: 15 mg/dL — AB
NITRITE: NEGATIVE
PH: 5 (ref 5.0–8.0)
Protein, ur: 30 mg/dL — AB
SPECIFIC GRAVITY, URINE: 1.024 (ref 1.005–1.030)

## 2015-09-10 LAB — COMPREHENSIVE METABOLIC PANEL
ALBUMIN: 1.9 g/dL — AB (ref 3.5–5.0)
ALT: 18 U/L (ref 17–63)
AST: 63 U/L — AB (ref 15–41)
Alkaline Phosphatase: 169 U/L — ABNORMAL HIGH (ref 38–126)
Anion gap: 19 — ABNORMAL HIGH (ref 5–15)
BUN: 62 mg/dL — ABNORMAL HIGH (ref 6–20)
CHLORIDE: 122 mmol/L — AB (ref 101–111)
CO2: 15 mmol/L — AB (ref 22–32)
CREATININE: 1.79 mg/dL — AB (ref 0.61–1.24)
Calcium: 10.2 mg/dL (ref 8.9–10.3)
GFR calc non Af Amer: 33 mL/min — ABNORMAL LOW (ref >60)
GFR, EST AFRICAN AMERICAN: 38 mL/min — AB (ref >60)
GLUCOSE: 150 mg/dL — AB (ref 65–99)
Potassium: 3.2 mmol/L — ABNORMAL LOW (ref 3.5–5.1)
SODIUM: 156 mmol/L — AB (ref 135–145)
Total Bilirubin: 1 mg/dL (ref 0.3–1.2)
Total Protein: 6.6 g/dL (ref 6.5–8.1)

## 2015-09-10 LAB — CBC WITH DIFFERENTIAL/PLATELET
BASOS ABS: 0 10*3/uL (ref 0.0–0.1)
BASOS PCT: 0 %
EOS ABS: 1.6 10*3/uL — AB (ref 0.0–0.7)
Eosinophils Relative: 9 %
HCT: 43.6 % (ref 39.0–52.0)
Hemoglobin: 13 g/dL (ref 13.0–17.0)
LYMPHS ABS: 0.9 10*3/uL (ref 0.7–4.0)
Lymphocytes Relative: 5 %
MCH: 28.1 pg (ref 26.0–34.0)
MCHC: 29.8 g/dL — AB (ref 30.0–36.0)
MCV: 94.2 fL (ref 78.0–100.0)
MONO ABS: 1.4 10*3/uL — AB (ref 0.1–1.0)
Monocytes Relative: 8 %
NEUTROS ABS: 13.7 10*3/uL — AB (ref 1.7–7.7)
Neutrophils Relative %: 78 %
PLATELETS: UNDETERMINED 10*3/uL (ref 150–400)
RBC: 4.63 MIL/uL (ref 4.22–5.81)
RDW: 19 % — ABNORMAL HIGH (ref 11.5–15.5)
WBC: 17.6 10*3/uL — ABNORMAL HIGH (ref 4.0–10.5)

## 2015-09-10 LAB — GLUCOSE, CAPILLARY
GLUCOSE-CAPILLARY: 67 mg/dL (ref 65–99)
GLUCOSE-CAPILLARY: 121 mg/dL — AB (ref 65–99)
GLUCOSE-CAPILLARY: 114 mg/dL — AB (ref 65–99)
Glucose-Capillary: 77 mg/dL (ref 65–99)
Glucose-Capillary: 69 mg/dL (ref 65–99)

## 2015-09-10 LAB — MRSA PCR SCREENING: MRSA BY PCR: NEGATIVE

## 2015-09-10 LAB — URINE MICROSCOPIC-ADD ON

## 2015-09-10 LAB — LACTIC ACID, PLASMA
LACTIC ACID, VENOUS: 9.2 mmol/L — AB (ref 0.5–2.0)
Lactic Acid, Venous: 8 mmol/L (ref 0.5–2.0)

## 2015-09-10 LAB — PHOSPHORUS: PHOSPHORUS: 3.6 mg/dL (ref 2.5–4.6)

## 2015-09-10 LAB — MAGNESIUM: MAGNESIUM: 2.2 mg/dL (ref 1.7–2.4)

## 2015-09-10 MED ORDER — SODIUM CHLORIDE 0.9 % IV BOLUS (SEPSIS): 1000.0000 mL | INTRAVENOUS | Status: DC | PRN

## 2015-09-10 MED ORDER — DEXTROSE 50 % IV SOLN
25.0000 mL | Freq: Once | INTRAVENOUS | Status: AC
  Administered 2015-09-10: 25 mL via INTRAVENOUS

## 2015-09-10 MED ORDER — DEXTROSE 5 % IV SOLN
INTRAVENOUS | Status: DC
  Administered 2015-09-10: 50 mL via INTRAVENOUS

## 2015-09-10 MED ORDER — DEXTROSE 50 % IV SOLN: 1.0000 | Freq: Once | INTRAVENOUS | Status: AC

## 2015-09-10 MED ORDER — DEXTROSE 50 % IV SOLN
INTRAVENOUS | Status: AC
  Administered 2015-09-10: 50 mL
  Filled 2015-09-10: qty 50

## 2015-09-10 MED ORDER — POTASSIUM CHLORIDE 10 MEQ/100ML IV SOLN
10.0000 meq | INTRAVENOUS | Status: AC
  Administered 2015-09-10 (×4): 10 meq via INTRAVENOUS
  Filled 2015-09-10 (×4): qty 100

## 2015-09-10 MED ORDER — MORPHINE SULFATE (PF) 2 MG/ML IV SOLN
1.0000 mg | INTRAVENOUS | Status: DC | PRN
  Administered 2015-09-10 – 2015-09-11 (×3): 2 mg via INTRAVENOUS
  Filled 2015-09-10 (×3): qty 1

## 2015-09-10 MED ORDER — DEXTROSE 50 % IV SOLN
INTRAVENOUS | Status: AC
  Filled 2015-09-10: qty 50

## 2015-09-10 MED ORDER — HEPARIN SODIUM (PORCINE) 5000 UNIT/ML IJ SOLN
5000.0000 [IU] | Freq: Three times a day (TID) | INTRAMUSCULAR | Status: DC
  Administered 2015-09-10 – 2015-09-11 (×2): 5000 [IU] via SUBCUTANEOUS
  Filled 2015-09-10 (×2): qty 1

## 2015-09-10 NOTE — Progress Notes (Signed)
Per report from night RN, Kent Mayo, Pt has scrotal swelling and "some kind of wound".  On shift change rounds at 0730, skin assessment revealed black to dark tissue in the scrotal area which was accompanied by a foul odor, likely in my experience to be necrotic.  There was also dark colored drainage from the scrotal area.  Although pt is obtunded, he moans in pain anytime you touch that area.  Dr Candiss Norse rounded on the unit shortly after and I directed him to assess Kent Mayo first, since there wasn't documentation of clear etiology of the sepsis in the chart up until this point.  Orders was placed for ultrasound and consults from there.  See orders management.  Carol Ada, RN

## 2015-09-10 NOTE — Care Management Note (Signed)
Case Management Note  Patient Details  Name: Kent Mayo MRN: [id number] Date of Birth: 23-Mar-1927  Subjective/Objective:    Adm w sepsis                Action/Plan: from nsg faiciity, sw ref made   Expected Discharge Date:                  Expected Discharge Plan:     In-House Referral:     Discharge planning Services     Post Acute Care Choice:    Choice offered to:     DME Arranged:    DME Agency:     HH Arranged:    [place] Agency:     Status of Service:     Medicare Important Message Given:    Date Medicare IM Given:    Medicare IM give by:    Date Additional Medicare IM Given:    Additional Medicare Important Message give by:     If discussed at [place] of Stay Meetings, dates discussed:    Additional Comments: ur review done  Lacretia Leigh, RN 09/10/2015, 7:40 AM

## 2015-09-10 NOTE — Progress Notes (Signed)
Pt BS was 67. Gave one amp of D5 and recheck. BS is now 121. Will cont to monitor.

## 2015-09-10 NOTE — Progress Notes (Signed)
RT assessed patient. Patient is currently on NRB sat 100%. RT talked with the nurse. Patient is a DNR. Patient is comfortable and no in any distress. BIPAP not needed at this time. RT will continue to monitor.

## 2015-09-10 NOTE — Consult Note (Signed)
Urology Consult   Physician requesting consult: P. Candiss Norse, MD Cc: Dr. Irine Seal Reason for consult: Scrotal abscess  History of Present Illness: Kent Mayo is a 82 y.o. male with PMH significant for DM, HTN, difficulty hearing, blindness, renal failure, and encephalopathy who was admitted via the ED last night for lactic acidosis, leukocytosis, abnormal LFTs, and worsening renal function.  On exam this AM, the pt was found to have a scrotal abscess and urology was consulted.  Pt has been declining in health over the past several months according to his brother Kent Mayo.  He has been voiding without a catheter as far as he knows.  The pt is obtunded and unable to communicate.  He is also a DNR/DNI.  Scrotal U/S shows diffuse scrotal swelling that may represent edema or cellulitis but there is no focal abscess.     Past Medical History  Diagnosis Date  . Diabetes mellitus without complication ([place])   . Hypertension   . Hearing difficulty of both ears     Bilateral hearing aids  . Blindness     Severe visual impairment OD, Blind OS  . Abnormality of gait   . Peripheral edema   . Glaucoma   . Dyslipidemia   . 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 UTI (urinary tract infection)   . Generalized muscle weakness   . Renal disorder   . Dysphagia   . Cognitive communication deficit   . Encephalopathy   . Urinary retention   . Neoplasm of prostate   . Renal failure   . Hyperlipidemia     Past Surgical History  Procedure Laterality Date  . Eye surgery      Current Hospital Medications:  Home Meds:    Medication List    ASK your doctor about these medications        acetaminophen 650 MG CR tablet  Commonly known as:  TYLENOL  Take 650 mg by mouth every 8 (eight) hours.     atorvastatin 10 MG tablet  Commonly known as:  LIPITOR  Take 10 mg by mouth at bedtime.     brimonidine 0.2 % ophthalmic solution  Commonly known as:  ALPHAGAN  Place 1 drop into both eyes  3 (three) times daily.     carvedilol 6.25 MG tablet  Commonly known as:  COREG  Take 6.25 mg by mouth 2 (two) times daily. With food.     donepezil 10 MG tablet  Commonly known as:  ARICEPT  Take 10 mg by mouth 2 (two) times daily.     dorzolamide-timolol 22.3-6.8 MG/ML ophthalmic solution  Commonly known as:  COSOPT  Place 1 drop into the right eye 2 (two) times daily.     guaiFENesin 600 MG 12 hr tablet  Commonly known as:  MUCINEX  Take 600 mg by mouth 2 (two) times daily as needed for to loosen phlegm.     lidocaine 5 %  Commonly known as:  LIDODERM  Place 1 patch onto the skin daily. Remove & Discard patch within 12 hours or as directed by MD     LORazepam 0.5 MG tablet  Commonly known as:  ATIVAN  Take 1 tablet (0.5 mg total) by mouth every 12 (twelve) hours as needed for anxiety (insomnia).     megestrol 400 MG/10ML suspension  Commonly known as:  MEGACE  Take 10 mLs (400 mg total) by mouth 2 (two) times daily.     memantine 10 MG tablet  Commonly known  as:  NAMENDA  Take 10 mg by mouth 2 (two) times daily.     NUTRITIONAL SUPPLEMENT Liqd  Take 1 Can by mouth 3 (three) times daily. Between meals. "Med Pass."     oxyCODONE 5 MG immediate release tablet  Commonly known as:  Oxy IR/ROXICODONE  Take 1 tablet (5 mg total) by mouth every 6 (six) hours as needed for severe pain (please give to patient prior to bathing / cleaning / physical therapy.).     senna-docusate 8.6-50 MG tablet  Commonly known as:  Senokot-S  Take 1 tablet by mouth at bedtime as needed for mild constipation.     verapamil 240 MG CR tablet  Commonly known as:  CALAN-SR  Take 240 mg by mouth daily.        Scheduled Meds: . heparin subcutaneous  5,000 Units Subcutaneous 3 times per day  . ipratropium-albuterol  3 mL Nebulization Q6H  . pantoprazole (PROTONIX) IV  40 mg Intravenous Q24H  . piperacillin-tazobactam (ZOSYN)  IV  3.375 g Intravenous 3 times per day  . vancomycin  750 mg  Intravenous Q24H   Continuous Infusions: . sodium chloride 125 mL/hr at 09/10/15 0641  . dextrose 50 mL (09/10/15 0917)   PRN Meds:.albuterol, LORazepam, morphine injection, sodium chloride  Allergies: No Known Allergies  Family History  Problem Relation Age of Onset  . Hypertension Mother   . Hypertension Father   . Diabetes Mellitus II Brother     Social History:  reports that he has quit smoking. He has never used smokeless tobacco. He reports that he does not drink alcohol or use illicit drugs.  ROS: A complete review of systems could not be performed as the pt is obtunded and only groans.   Physical Exam:  Vital signs in last 24 hours: Temp:  [96.8 F (36 C)-101 F (38.3 C)] 96.8 F (36 C) (02/07 0752) Pulse Rate:  [58-86] 60 (02/07 1200) Resp:  [22-44] 30 (02/07 1200) BP: (81-158)/(54-69) 118/58 mmHg (02/07 1200) SpO2:  [95 %-100 %] 100 % (02/07 1200) Weight:  [74.6 kg (164 lb 7.4 oz)-80 kg (176 lb 5.9 oz)] 74.6 kg (164 lb 7.4 oz) (02/06 2221) Constitutional:  Obtunded; groans but does not follow commands  Cardiovascular: Regular rate and rhythm Respiratory: Normal respiratory effort with mask GI: Abdomen is soft, nontender, nondistended, no abdominal masses GU: condom cath in place with minimal dark yellow urine in bag; scrotum edematous with skin discoloration over anterior aspect; purulent drainage noted on towel under scrotum; smell of gangrene prominent. Blackened necrotic skin over apex of scrotum noted.   Laboratory Data:   Recent Labs  09/19/2015 1626 09/10/15 0747  WBC 24.6* 17.6*  HGB 14.3 13.0  HCT 44.4 43.6  PLT 341 PLATELET CLUMPS NOTED ON SMEAR, UNABLE TO ESTIMATE     Recent Labs  09/19/2015 1626  NA 147*  K 4.0  CL 106  GLUCOSE 167*  BUN 64*  CALCIUM 10.6*  CREATININE 2.22*     Results for orders placed or performed during the hospital encounter of 09/18/2015 (from the past 24 hour(s))  Comprehensive metabolic panel     Status: Abnormal    Collection Time: 09/15/2015  4:26 PM  Result Value Ref Range   Sodium 147 (H) 135 - 145 mmol/L   Potassium 4.0 3.5 - 5.1 mmol/L   Chloride 106 101 - 111 mmol/L   CO2 16 (L) 22 - 32 mmol/L   Glucose, Bld 167 (H) 65 - 99 mg/dL  BUN 64 (H) 6 - 20 mg/dL   Creatinine, Ser 2.22 (H) 0.61 - 1.24 mg/dL   Calcium 10.6 (H) 8.9 - 10.3 mg/dL   Total Protein 8.0 6.5 - 8.1 g/dL   Albumin 2.5 (L) 3.5 - 5.0 g/dL   AST 35 15 - 41 U/L   ALT 15 (L) 17 - 63 U/L   Alkaline Phosphatase 199 (H) 38 - 126 U/L   Total Bilirubin 1.4 (H) 0.3 - 1.2 mg/dL   GFR calc non Af Amer 25 (L) >60 mL/min   GFR calc Af Amer 29 (L) >60 mL/min   Anion gap 25 (H) 5 - 15  CBC WITH DIFFERENTIAL     Status: Abnormal   Collection Time: 09/10/2015  4:26 PM  Result Value Ref Range   WBC 24.6 (H) 4.0 - 10.5 K/uL   RBC 4.85 4.22 - 5.81 MIL/uL   Hemoglobin 14.3 13.0 - 17.0 g/dL   HCT 44.4 39.0 - 52.0 %   MCV 91.5 78.0 - 100.0 fL   MCH 29.5 26.0 - 34.0 pg   MCHC 32.2 30.0 - 36.0 g/dL   RDW 15.8 (H) 11.5 - 15.5 %   Platelets 341 150 - 400 K/uL   Neutrophils Relative % 87 %   Lymphocytes Relative 4 %   Monocytes Relative 9 %   Eosinophils Relative 0 %   Basophils Relative 0 %   Neutro Abs 21.4 (H) 1.7 - 7.7 K/uL   Lymphs Abs 1.0 0.7 - 4.0 K/uL   Monocytes Absolute 2.2 (H) 0.1 - 1.0 K/uL   Eosinophils Absolute 0.0 0.0 - 0.7 K/uL   Basophils Absolute 0.0 0.0 - 0.1 K/uL   RBC Morphology BURR CELLS    WBC Morphology INCREASED BANDS (>20% BANDS)   I-Stat CG4 Lactic Acid, ED  (not at  [facility])     Status: Abnormal   Collection Time: 09/30/2015  4:50 PM  Result Value Ref Range   Lactic Acid, Venous 10.00 (HH) 0.5 - 2.0 mmol/L   Comment NOTIFIED PHYSICIAN   I-Stat CG4 Lactic Acid, ED  (not at  [facility])     Status: Abnormal   Collection Time: 09/10/2015  8:10 PM  Result Value Ref Range   Lactic Acid, Venous 8.00 (HH) 0.5 - 2.0 mmol/L   Comment NOTIFIED PHYSICIAN   Lactic acid, plasma     Status: Abnormal   Collection Time: 10/01/2015  11:55 PM  Result Value Ref Range   Lactic Acid, Venous 9.2 (HH) 0.5 - 2.0 mmol/L  Magnesium     Status: None   Collection Time: 09/04/2015 11:55 PM  Result Value Ref Range   Magnesium 2.2 1.7 - 2.4 mg/dL  Phosphorus     Status: None   Collection Time: 09/07/2015 11:55 PM  Result Value Ref Range   Phosphorus 3.6 2.5 - 4.6 mg/dL  Glucose, capillary     Status: None   Collection Time: 09/10/15 12:18 AM  Result Value Ref Range   Glucose-Capillary 77 65 - 99 mg/dL   Comment 1 Capillary Specimen   MRSA PCR Screening     Status: None   Collection Time: 09/10/15 12:53 AM  Result Value Ref Range   MRSA by PCR NEGATIVE NEGATIVE  Urinalysis, Routine w reflex microscopic (not at [facility])     Status: Abnormal   Collection Time: 09/10/15  5:15 AM  Result Value Ref Range   Color, Urine AMBER (A) YELLOW   APPearance CLOUDY (A) CLEAR   Specific Gravity, Urine 1.024 1.005 -  1.030   pH 5.0 5.0 - 8.0   Glucose, UA NEGATIVE NEGATIVE mg/dL   Hgb urine dipstick LARGE (A) NEGATIVE   Bilirubin Urine SMALL (A) NEGATIVE   Ketones, ur 15 (A) NEGATIVE mg/dL   Protein, ur 30 (A) NEGATIVE mg/dL   Nitrite NEGATIVE NEGATIVE   Leukocytes, UA LARGE (A) NEGATIVE  Urine microscopic-add on     Status: Abnormal   Collection Time: 09/10/15  5:15 AM  Result Value Ref Range   Squamous Epithelial / LPF 6-30 (A) NONE SEEN   WBC, UA 6-30 0 - 5 WBC/hpf   RBC / HPF 6-30 0 - 5 RBC/hpf   Bacteria, UA MANY (A) NONE SEEN   Casts GRANULAR CAST (A) NEGATIVE   Urine-Other YEAST PRESENT   Glucose, capillary     Status: None   Collection Time: 09/10/15  6:26 AM  Result Value Ref Range   Glucose-Capillary 67 65 - 99 mg/dL   Comment 1 Capillary Specimen   Glucose, capillary     Status: Abnormal   Collection Time: 09/10/15  6:55 AM  Result Value Ref Range   Glucose-Capillary 121 (H) 65 - 99 mg/dL   Comment 1 Capillary Specimen   CBC WITH DIFFERENTIAL     Status: Abnormal   Collection Time: 09/10/15  7:47 AM  Result Value Ref  Range   WBC 17.6 (H) 4.0 - 10.5 K/uL   RBC 4.63 4.22 - 5.81 MIL/uL   Hemoglobin 13.0 13.0 - 17.0 g/dL   HCT 43.6 39.0 - 52.0 %   MCV 94.2 78.0 - 100.0 fL   MCH 28.1 26.0 - 34.0 pg   MCHC 29.8 (L) 30.0 - 36.0 g/dL   RDW 19.0 (H) 11.5 - 15.5 %   Platelets PLATELET CLUMPS NOTED ON SMEAR, UNABLE TO ESTIMATE 150 - 400 K/uL   Neutrophils Relative % 78 %   Lymphocytes Relative 5 %   Monocytes Relative 8 %   Eosinophils Relative 9 %   Basophils Relative 0 %   Neutro Abs 13.7 (H) 1.7 - 7.7 K/uL   Lymphs Abs 0.9 0.7 - 4.0 K/uL   Monocytes Absolute 1.4 (H) 0.1 - 1.0 K/uL   Eosinophils Absolute 1.6 (H) 0.0 - 0.7 K/uL   Basophils Absolute 0.0 0.0 - 0.1 K/uL   RBC Morphology POLYCHROMASIA PRESENT    WBC Morphology TOXIC GRANULATION   Lactic acid, plasma     Status: Abnormal   Collection Time: 09/10/15  8:16 AM  Result Value Ref Range   Lactic Acid, Venous 8.0 (HH) 0.5 - 2.0 mmol/L  Glucose, capillary     Status: Abnormal   Collection Time: 09/10/15  8:20 AM  Result Value Ref Range   Glucose-Capillary 114 (H) 65 - 99 mg/dL   Comment 1 Capillary Specimen    Recent Results (from the past 240 hour(s))  MRSA PCR Screening     Status: None   Collection Time: 09/10/15 12:53 AM  Result Value Ref Range Status   MRSA by PCR NEGATIVE NEGATIVE Final    Comment:        The GeneXpert MRSA Assay (FDA approved for NASAL specimens only), is one component of a comprehensive MRSA colonization surveillance program. It is not intended to diagnose MRSA infection nor to guide or monitor treatment for MRSA infections.     Renal Function:  Recent Labs  09/25/2015 1626  CREATININE 2.22*   Estimated Creatinine Clearance: 25.2 mL/min (by C-G formula based on Cr of 2.22).  Radiologic Imaging: Ct Head  Wo Contrast  09/17/2015  CLINICAL DATA:  Decreased level of consciousness. Decreased O2 sats. Tachypnea, tachycardia. EXAM: CT HEAD WITHOUT CONTRAST TECHNIQUE: Contiguous axial images were obtained from  the base of the skull through the vertex without intravenous contrast. COMPARISON:  08/13/2015 FINDINGS: There is atrophy and chronic small vessel disease changes. No acute intracranial abnormality. Specifically, no hemorrhage, hydrocephalus, mass lesion, acute infarction, or significant intracranial injury. No acute calvarial abnormality. Visualized paranasal sinuses and mastoids clear. Orbital soft tissues unremarkable. IMPRESSION: No acute intracranial abnormality. Atrophy, chronic microvascular disease. Electronically Signed   By: Rolm Baptise M.D.   On: 09/16/2015 18:41   US Scrotum  09/10/2015  CLINICAL DATA:  Scrotal abscess. Metastatic prostate cancer. Swollen scrotum EXAM: ULTRASOUND OF SCROTUM TECHNIQUE: Complete ultrasound examination of the testicles, epididymis, and other scrotal structures was performed. COMPARISON:  None. FINDINGS: Right testicle Measurements: 2.9 x 2.1 x 2.2 cm. No mass or microlithiasis visualized. Left testicle Measurements: 2.7 x 2.2 x 2.3 cm. No mass or microlithiasis visualized. Right epididymis:  Normal in size and appearance. Left epididymis:  Normal in size and appearance. Hydrocele:  None visualized. Varicocele:  None visualized. Scrotal wall is diffusely thickened.  No focal abscess. Blood flow seen to both testes by Doppler. IMPRESSION: Diffuse scrotal swelling may represent edema or cellulitis. No abscess. Normal testes Electronically Signed   By: Franchot Gallo M.D.   On: 09/10/2015 12:06   US Renal  09/10/2015  CLINICAL DATA:  Acute renal failure EXAM: RENAL / URINARY TRACT ULTRASOUND COMPLETE COMPARISON:  08/11/2015 FINDINGS: Right Kidney: Length: 11.5 cm. Echogenicity within normal limits. No mass or hydronephrosis visualized. Left Kidney: Length: 10.2 cm. Hydronephrosis is noted extending into the proximal left ureter. Bladder: The bladder is well distended. The prostate is enlarged with extension superiorly into the bladder consistent with the given clinical  history of prostate carcinoma. IMPRESSION: Left hydronephrosis. This is likely related to the underlying prostate carcinoma growing into the bladder. This is stable from the previous CT Electronically Signed   By: Inez Catalina M.D.   On: 09/10/2015 12:05   Dg Chest Portable 1 View  10/01/2015  CLINICAL DATA:  Altered mental status.  Rapid respirations. EXAM: PORTABLE CHEST 1 VIEW COMPARISON:  08/11/2015 chest x-ray.  Chest CT 08/12/2015. FINDINGS: COPD with hyperinflation. Normal heart size. LEFT lower lobe atelectasis. LEFT pleural effusion with or without consolidation. Worsening aeration from priors. Small nodules noted on previous chest CT not visible. IMPRESSION: LEFT lower lobe atelectasis with or without consolidation and LEFT pleural effusion. Slight worsening aeration from priors. Electronically Signed   By: Staci Righter M.D.   On: 09/14/2015 17:18     Impression/Recommendation  I have discussed the case wit the ICU RN, and then the patient's brother and his nephew. All are still in agreement with the following:  Mr. Maretta Los is an unfortunate 82 yo male , who has been seen by Dr. Jeffie Pollock in the past, who now has Fournier's gangrene--pt would require extensive surgery with a prolonged recovery.  He has severe sepsis syndrome and is a DNR/DNI.  The CCM/IM teams agree that the pt likely would not survive surgery or if he did would not be able to be extubated post operatively.  Both teams have had conversations with the pt's brother (POA) and his son and have decided on supportive palliative care only.    He is receiving IV fluids and antibiotics and comfort care. He will not have surgery.    Please re-consult Urology if  needed.    [place], [place] 09/10/2015, 1:12 PM

## 2015-09-10 NOTE — Progress Notes (Signed)
Rt placed pt on nasal cannula 6 Lpm, Sat 100%. RN notified.

## 2015-09-10 NOTE — Progress Notes (Signed)
Patient Demographics:    Kent Mayo, is a 82 y.o. male, DOB - 08/31/1926, [id number]  Admit date - 09/26/2015   Admitting Physician Reubin Milan, MD  Outpatient Primary MD for the patient is No primary care provider on file.  LOS - 1   Chief Complaint  Patient presents with  . Altered Mental Status        Subjective:    Kent Mayo today in bed, obtunded and unable to answer questions.   Assessment  & Plan :     1. Severe sepsis with acute renal failure. After detailed examination cause found to be scrotal abscess with possible Fournier's gangrene. Have consulted PC CM along with urology early this morning at 8:00, continue IV fluids along with empiric IV antibiotics, prognosis appears grave as lactic acid is close to 10. He is already hypotensive.   Discussed his poor prognosis with his brother who is the power of attorney. Told him clearly that patient might die in the OR. Off note patient has prostate cancer and his quality of life is poor to begin with. Patient's brother the power of attorney will discuss his case with patient's son and get back to [place] on the line of care. Will also involve palliative care at this time.   2. Acute respiratory failure secondary to sepsis and metabolic acidosis requiring compensation. Continue nonrebreather mask. Continue supportive care.   3. ARF on CK D stage III. Baseline creatinine 1.6, worse due to sepsis, hydrate and monitor. Obtain renal ultrasound as well.   4. I poor glycemia. I gain due to sepsis. For now supportive care for sepsis along with D5 drip and addition to normal saline.   5. History of prostate cancer with metastasis to bone, adrenals. Poor prognosis. Once stable follow with primary care physician and urologist in the  outpatient setting.   6. Toxic encephalopathy due to #1 above. Head CT nonacute. Supportive care.    7. Essential hypertension. Blood pressure soft due to sepsis. IV fluids, hold blood pressure medications.    8. Metabolic acidosis due to elevated lactate from sepsis. Supportive care and hydration.    9. Elevated LFTs. Also likely reactionary to sepsis. We will monitor. No right upper quadrant tenderness.     Code Status : DO NOT RESUSCITATE  Family Communication  : Discussed with his brother, currently DO NOT RESUSCITATE, described to him poor prognosis, he will discuss patient's case with patient's son and call us back about any potential surgery. Clearly described to him that patient might pass away during surgery.  Disposition Plan  : Stay in stepdown  Consults  :  PCCM, urology  Procedures  :   CT head nonacute.  Ordered stat scrotal ultrasound along with renal ultrasound at 8 AM on 09/10/2015  DVT Prophylaxis  :  Heparin ordered  Lab Results  Component Value Date   PLT PLATELET CLUMPS NOTED ON SMEAR, UNABLE TO ESTIMATE 09/10/2015    Inpatient Medications  Scheduled Meds: . ipratropium-albuterol  3 mL Nebulization Q6H  . pantoprazole (PROTONIX) IV  40 mg Intravenous Q24H  . piperacillin-tazobactam (ZOSYN)  IV  3.375 g Intravenous 3 times per day  . vancomycin  750 mg Intravenous Q24H   Continuous Infusions: . sodium  chloride 125 mL/hr at 09/10/15 0641  . dextrose 50 mL (09/10/15 0917)   PRN Meds:.albuterol, LORazepam, sodium chloride  Antibiotics  :     Anti-infectives    Start     Dose/Rate Route Frequency Ordered Stop   09/10/15 1700  vancomycin (VANCOCIN) IVPB 750 mg/150 ml premix     750 mg 150 mL/hr over 60 Minutes Intravenous Every 24 hours 09/14/2015 1734     09/10/15 1700  piperacillin-tazobactam (ZOSYN) IVPB 3.375 g     3.375 g 12.5 mL/hr over 240 Minutes Intravenous 3 times per day 09/30/2015 1734     09/12/2015 1645  piperacillin-tazobactam  (ZOSYN) IVPB 3.375 g     3.375 g 100 mL/hr over 30 Minutes Intravenous  Once 09/08/2015 1637 09/25/2015 1728   09/08/2015 1645  vancomycin (VANCOCIN) IVPB 1000 mg/200 mL premix  Status:  Discontinued     1,000 mg 200 mL/hr over 60 Minutes Intravenous  Once 09/14/2015 1637 09/30/2015 1640   09/15/2015 1645  vancomycin (VANCOCIN) 1,500 mg in sodium chloride 0.9 % 500 mL IVPB     1,500 mg 250 mL/hr over 120 Minutes Intravenous  Once 09/10/2015 1641 09/12/2015 1912        Objective:   Filed Vitals:   09/10/15 0700 09/10/15 0752 09/10/15 0800 09/10/15 0842  BP: 123/61  118/55 118/55  Pulse: 62  62 59  Temp:  96.8 F (36 C)    TempSrc:  Axillary    Resp: 23  30 26   Height:      Weight:      SpO2: 100%  100% 100%    Wt Readings from Last 3 Encounters:  09/16/2015 74.6 kg (164 lb 7.4 oz)  08/16/15 79.969 kg (176 lb 4.8 oz)  04/01/15 103.4 kg (227 lb 15.3 oz)     Intake/Output Summary (Last 24 hours) at 09/10/15 1052 Last data filed at 09/10/15 0800  Gross per 24 hour  Intake 1114.58 ml  Output      0 ml  Net 1114.58 ml     Physical Exam  Patient is obtunded and unable to move his extremities to verbal commands, does withdraw to pain, [place].AT,PERRAL Supple Neck,No JVD, No cervical lymphadenopathy appriciated.  Symmetrical Chest wall movement, Good air movement bilaterally, CTAB RRR,No Gallops,Rubs or new Murmurs, No Parasternal Heave +ve B.Sounds, Abd Soft, No tenderness, No organomegaly appriciated, No rebound - guarding or rigidity. Swollen and gangrenous looking scrotum with foul smell No Cyanosis, Clubbing or edema, No new Rash or bruise       Data Review:   Micro Results Recent Results (from the past 240 hour(s))  MRSA PCR Screening     Status: None   Collection Time: 09/10/15 12:53 AM  Result Value Ref Range Status   MRSA by PCR NEGATIVE NEGATIVE Final    Comment:        The GeneXpert MRSA Assay (FDA approved for NASAL specimens only), is one component of a comprehensive  MRSA colonization surveillance program. It is not intended to diagnose MRSA infection nor to guide or monitor treatment for MRSA infections.     Radiology Reports     Ct Head Wo Contrast  09/24/2015  CLINICAL DATA:  Decreased level of consciousness. Decreased O2 sats. Tachypnea, tachycardia. EXAM: CT HEAD WITHOUT CONTRAST TECHNIQUE: Contiguous axial images were obtained from the base of the skull through the vertex without intravenous contrast. COMPARISON:  08/13/2015 FINDINGS: There is atrophy and chronic small vessel disease changes. No acute intracranial abnormality. Specifically, no hemorrhage,  hydrocephalus, mass lesion, acute infarction, or significant intracranial injury. No acute calvarial abnormality. Visualized paranasal sinuses and mastoids clear. Orbital soft tissues unremarkable. IMPRESSION: No acute intracranial abnormality. Atrophy, chronic microvascular disease. Electronically Signed   By: Rolm Baptise M.D.   On: 09/27/2015 18:41      Dg Chest Portable 1 View  09/21/2015  CLINICAL DATA:  Altered mental status.  Rapid respirations. EXAM: PORTABLE CHEST 1 VIEW COMPARISON:  08/11/2015 chest x-ray.  Chest CT 08/12/2015. FINDINGS: COPD with hyperinflation. Normal heart size. LEFT lower lobe atelectasis. LEFT pleural effusion with or without consolidation. Worsening aeration from priors. Small nodules noted on previous chest CT not visible. IMPRESSION: LEFT lower lobe atelectasis with or without consolidation and LEFT pleural effusion. Slight worsening aeration from priors. Electronically Signed   By: Staci Righter M.D.   On: 09/14/2015 17:18       CBC  Recent Labs Lab 09/08/2015 1626 09/10/15 0747  WBC 24.6* 17.6*  HGB 14.3 13.0  HCT 44.4 43.6  PLT 341 PLATELET CLUMPS NOTED ON SMEAR, UNABLE TO ESTIMATE  MCV 91.5 94.2  MCH 29.5 28.1  MCHC 32.2 29.8*  RDW 15.8* 19.0*  LYMPHSABS 1.0 PENDING  MONOABS 2.2* PENDING  EOSABS 0.0 PENDING  BASOSABS 0.0 PENDING    Chemistries    Recent Labs Lab 09/28/2015 1626 09/20/2015 2355  NA 147*  --   K 4.0  --   CL 106  --   CO2 16*  --   GLUCOSE 167*  --   BUN 64*  --   CREATININE 2.22*  --   CALCIUM 10.6*  --   MG  --  2.2  AST 35  --   ALT 15*  --   ALKPHOS 199*  --   BILITOT 1.4*  --    ------------------------------------------------------------------------------------------------------------------ No results for input(s): CHOL, HDL, LDLCALC, TRIG, CHOLHDL, LDLDIRECT in the last 72 hours.  Lab Results  Component Value Date   HGBA1C 5.2 08/12/2015   ------------------------------------------------------------------------------------------------------------------ No results for input(s): TSH, T4TOTAL, T3FREE, THYROIDAB in the last 72 hours.  Invalid input(s): FREET3 ------------------------------------------------------------------------------------------------------------------ No results for input(s): VITAMINB12, FOLATE, FERRITIN, TIBC, IRON, RETICCTPCT in the last 72 hours.  Coagulation profile No results for input(s): INR, PROTIME in the last 168 hours.  No results for input(s): DDIMER in the last 72 hours.  Cardiac Enzymes No results for input(s): CKMB, TROPONINI, MYOGLOBIN in the last 168 hours.  Invalid input(s): CK ------------------------------------------------------------------------------------------------------------------ No results found for: BNP  Time Spent in minutes   35   Marliss Buttacavoli K M.D on 09/10/2015 at 10:52 AM  Between 7am to 7pm - Pager - [phone]  After 7pm go to [email or web address] - password [facility]  [facility] -  Office  [phone]

## 2015-09-10 NOTE — Consult Note (Signed)
Name: Kent Mayo MRN: [id number] DOB: 28-Aug-1926    ADMISSION DATE:  09/12/2015 CONSULTATION DATE:  2/7  REFERRING MD :  [facility]  CHIEF COMPLAINT: Septic  BRIEF PATIENT DESCRIPTION: Elderly obtunded AAM  SIGNIFICANT EVENTS  DNR  STUDIES:     HISTORY OF PRESENT ILLNESS:   Kent Mayo is an 82 yo, demented NHP with multiple medical problems that include but not confined to, Prostate cancer with metastases to bone and adrenals, blind, wheel chair bound, FTT and now with scrotal infection and possible Fournier's ganggrene of scrotum. Lactic acid is 8.0 and creatine is 2.2 bt he maintains an adequate blood pressure currently. PCCM asked to evaluate. We note that he is an DNR and his chance for any meaningful recovery is amost non existent. We would encourage  further conversations with his brother who is medical POA with leaning towards full palliation and goal of full comfort care.   PAST MEDICAL HISTORY :   has a past medical history of Diabetes mellitus without complication ([place]); Hypertension; Hearing difficulty of both ears; Blindness; Abnormality of gait; Peripheral edema; Glaucoma; Dyslipidemia; Nontraumatic cerebral hemorrhage ([place]); UTI (urinary tract infection); Generalized muscle weakness; Renal disorder; Dysphagia; Cognitive communication deficit; Encephalopathy; Urinary retention; Neoplasm of prostate; Renal failure; and Hyperlipidemia.  has past surgical history that includes Eye surgery. Prior to Admission medications   Medication Sig Start Date End Date Taking? Authorizing Provider  acetaminophen (TYLENOL) 650 MG CR tablet Take 650 mg by mouth every 8 (eight) hours.    Historical Provider, MD  atorvastatin (LIPITOR) 10 MG tablet Take 10 mg by mouth at bedtime.    Historical Provider, MD  brimonidine (ALPHAGAN) 0.2 % ophthalmic solution Place 1 drop into both eyes 3 (three) times daily.    Historical Provider, MD  carvedilol (COREG) 6.25 MG tablet Take 6.25 mg by mouth  2 (two) times daily. With food. 08/23/14   Historical Provider, MD  donepezil (ARICEPT) 10 MG tablet Take 10 mg by mouth 2 (two) times daily.     Historical Provider, MD  dorzolamide-timolol (COSOPT) 22.3-6.8 MG/ML ophthalmic solution Place 1 drop into the right eye 2 (two) times daily.    Historical Provider, MD  guaiFENesin (MUCINEX) 600 MG 12 hr tablet Take 600 mg by mouth 2 (two) times daily as needed for to loosen phlegm.    Historical Provider, MD  lidocaine (LIDODERM) 5 % Place 1 patch onto the skin daily. Remove & Discard patch within 12 hours or as directed by MD 08/16/15   Barton Dubois, MD  LORazepam (ATIVAN) 0.5 MG tablet Take 1 tablet (0.5 mg total) by mouth every 12 (twelve) hours as needed for anxiety (insomnia). 08/16/15   Barton Dubois, MD  megestrol (MEGACE) 400 MG/10ML suspension Take 10 mLs (400 mg total) by mouth 2 (two) times daily. 08/06/15   Orlie Dakin, MD  memantine (NAMENDA) 10 MG tablet Take 10 mg by mouth 2 (two) times daily.    Historical Provider, MD  mirtazapine (REMERON) 15 MG tablet Take 15 mg by mouth at bedtime.    Historical Provider, MD  NUTRITIONAL SUPPLEMENT LIQD Take 1 Can by mouth 3 (three) times daily. Between meals. "Med Pass."    Historical Provider, MD  oxyCODONE (OXY IR/ROXICODONE) 5 MG immediate release tablet Take 1 tablet (5 mg total) by mouth every 6 (six) hours as needed for severe pain (please give to patient prior to bathing / cleaning / physical therapy.). 08/16/15   Barton Dubois, MD  senna-docusate (SENOKOT-S) 8.6-50 MG  per tablet Take 1 tablet by mouth at bedtime as needed for mild constipation. 03/22/15   [name], MD  verapamil (CALAN-SR) 240 MG CR tablet Take 240 mg by mouth daily.     Historical Provider, MD   No Known Allergies  FAMILY HISTORY:  family history includes Diabetes Mellitus II in his brother; Hypertension in his father and mother. SOCIAL HISTORY:  reports that he has quit smoking. He has never used smokeless tobacco. He  reports that he does not drink alcohol or use illicit drugs.  REVIEW OF SYSTEMS:   na  SUBJECTIVE:   VITAL SIGNS: Temp:  [96.8 F (36 C)-101 F (38.3 C)] 96.8 F (36 C) (02/07 0752) Pulse Rate:  [59-86] 59 (02/07 0842) Resp:  [22-44] 26 (02/07 0842) BP: (85-158)/(54-69) 118/55 mmHg (02/07 0842) SpO2:  [95 %-100 %] 100 % (02/07 0842) Weight:  [164 lb 7.4 oz (74.6 kg)-176 lb 5.9 oz (80 kg)] 164 lb 7.4 oz (74.6 kg) (02/06 2221)  PHYSICAL EXAMINATION: General: Elderly obtunded AAM Neuro: Obtunded, no follows command HEENT: No LAN/JVD Cardiovascular:  HSD Lungs:  Decreased bs bases Abdomen:  Soft, large amount of scrotal edema and purulent drainage Musculoskeletal:  intact Skin:  Cool/dry   Recent Labs Lab 09/20/2015 1626  NA 147*  K 4.0  CL 106  CO2 16*  BUN 64*  CREATININE 2.22*  GLUCOSE 167*    Recent Labs Lab 09/23/2015 1626 09/10/15 0747  HGB 14.3 13.0  HCT 44.4 43.6  WBC 24.6* PENDING  PLT 341 PENDING   Ct Head Wo Contrast  09/17/2015  CLINICAL DATA:  Decreased level of consciousness. Decreased O2 sats. Tachypnea, tachycardia. EXAM: CT HEAD WITHOUT CONTRAST TECHNIQUE: Contiguous axial images were obtained from the base of the skull through the vertex without intravenous contrast. COMPARISON:  08/13/2015 FINDINGS: There is atrophy and chronic small vessel disease changes. No acute intracranial abnormality. Specifically, no hemorrhage, hydrocephalus, mass lesion, acute infarction, or significant intracranial injury. No acute calvarial abnormality. Visualized paranasal sinuses and mastoids clear. Orbital soft tissues unremarkable. IMPRESSION: No acute intracranial abnormality. Atrophy, chronic microvascular disease. Electronically Signed   By: Rolm Baptise M.D.   On: 09/04/2015 18:41   Dg Chest Portable 1 View  09/08/2015  CLINICAL DATA:  Altered mental status.  Rapid respirations. EXAM: PORTABLE CHEST 1 VIEW COMPARISON:  08/11/2015 chest x-ray.  Chest CT 08/12/2015.  FINDINGS: COPD with hyperinflation. Normal heart size. LEFT lower lobe atelectasis. LEFT pleural effusion with or without consolidation. Worsening aeration from priors. Small nodules noted on previous chest CT not visible. IMPRESSION: LEFT lower lobe atelectasis with or without consolidation and LEFT pleural effusion. Slight worsening aeration from priors. Electronically Signed   By: Staci Righter M.D.   On: 09/07/2015 17:18    ASSESSMENT      Acute respiratory failure (HCC)    Prostate cancer metastatic to bone (HCC)and to liver, adrenals     Scrotal abscess possible fournier's    FTT (failure to thrive) in adult   Severe sepsis (HCC)   HTN (hypertension)   Diabetes mellitus ([place])   History of CVA (cerebrovascular accident)    Dementia   Urinary retention   Acute renal failure (HCC)   UTI (urinary tract infection)   Renal failure (ARF), acute on chronic (HCC)  Altered mental status   hypernatremia   Lactic acidosis   Abnormal LFTs     Discussion:  Mr. Shenkman is an 82 yo, demented NHP with multiple medical problems that include but not confined  to, Prostate cancer with metastases to bone and adrenals, blind, wheel chair bound, FTT and now with scrotal infection and possible Fournier's ganggrene of scrotum. Lactic acid is 8.0 and creatine is 2.2 bt he maintains an adequate blood pressure currently. PCCM asked to evaluate. We note that he is an DNR and his chance for any meaningful recovery is amost non existent. We would encourage  further conversations with his brother who is medical POA with leaning towards full palliation and goal of full comfort care.   PLAN: Abx as noted Fluids as noted Palliative care conult PCCm can place CVL if needed but will defer at this time.  Note: Prolong conversation with brother and with son on speaker phone concerning options. Broached the subject of not undergoing surgery and the brother Rain Curl and son Mikos Enter Jr(lives in  [place]) agreed that they don't want surgery. Abx and fluids are OK but no other heroic measures.   Richardson Landry Lekha Dancer ACNP Maryanna Shape PCCM Pager [phone] till 3 pm If no answer page [phone] 09/10/2015, 9:32 AM

## 2015-09-10 NOTE — Consult Note (Signed)
Consultation Note Date: 09/10/2015   Patient Name: Kent Mayo  DOB: 08/23/1926  MRN: [id number]  Age / Sex: 82 y.o., male  PCP: No primary care provider on file. Referring Physician: Thurnell Lose, MD  Reason for Consultation: Establishing goals of care, Non pain symptom management, Pain control and Terminal Care    Clinical Assessment/Narrative: 82 yo male with metastatic prostate cancer, Dementia, DM II, Blindness, hard of hearing, Dysphagia was admitted from SNF due to decreased level of consciousness.  In the ER he was found to be in severe sepsis, acute respiratory failure and acute on chronic renal failure.  His lactic acid and WBC are both significantly elevated.  Attending MD feels this is likely due to a scrotal abscess with possible gangrene.  His brother Kent Mayo is at bedside.  He relates that the patient is a DNR.  He says his brother has been losing weight and getting weaker for the past year.  He was admitted in January 2017 after a fall with dehydration and failure to thrive.  He has lost over 80 lbs in the past year.    Urology and critical care were consulted on admission.  The patient's brother Kent Mayo [facility]) and his son Kent Mayo opted against any potential surgery.  They understand that Kent Mayo likely only has days left to live.  They have decided on DNR code status. They would like to treat the treatable and continue antibiotics, IVF, and appropriate comfort measures.  They understand that the patient will likely not survive this hospitalization.   Contacts/Participants in Discussion:  Kent Mayo, and via telephone Kent Mayo (son) Primary Decision Maker: Kent Mayo HCPOA: yes, Kent Mayo  SUMMARY OF RECOMMENDATIONS  Code Status/Advance Care Planning: DNR    Code Status Orders        Start     Ordered   09/25/2015 2256  Do not attempt resuscitation (DNR)   Continuous      Question Answer Comment  In the event of cardiac or respiratory ARREST Do not call a "code blue"   In the event of cardiac or respiratory ARREST Do not perform Intubation, CPR, defibrillation or ACLS   In the event of cardiac or respiratory ARREST Use medication by any route, position, wound care, and other measures to relive pain and suffering. May use oxygen, suction and manual treatment of airway obstruction as needed for comfort.      09/22/2015 2255       Symptom Management:   Pain:  Morphine 1-2 mg q2 prn  Agitation:  Ativan 0.5 mg q4 prn   Psycho-social/Spiritual:  Support System: Adequate Desire for further Chaplaincy support:yes Additional Recommendations: Caregiving  Support/Resources  Prognosis: days.  Patient is severely septic, hypothermic, dehydrated, with advanced dementia, and metastatic prostate cancer.  Discharge Planning: Anticipated Hospital Death   Chief Complaint/ Primary Diagnoses: Present on Admission:  . Severe sepsis ([place]) . HTN (hypertension) . Dementia . Renal failure (ARF), acute on chronic (HCC) . Altered mental status . Acute respiratory failure ([place]) . Hypernatremia . Lactic acidosis . Abnormal LFTs . FTT (failure to thrive) in adult . Urinary retention . Acute renal failure ([place]) . UTI (urinary tract infection)  I have reviewed the medical record, interviewed the patient and family, and examined the patient. The following aspects are pertinent.  Past Medical History  Diagnosis Date  . Diabetes mellitus without complication ([place])   . Hypertension   . Hearing difficulty of both ears     Bilateral hearing aids  .  Blindness     Severe visual impairment OD, Blind OS  . Abnormality of gait   . Peripheral edema   . Glaucoma   . Dyslipidemia   . Nontraumatic cerebral hemorrhage ([place])   . UTI (urinary tract infection)   . Generalized muscle weakness   . Renal disorder   . Dysphagia   . Cognitive communication deficit   .  Encephalopathy   . Urinary retention   . Neoplasm of prostate   . Renal failure   . Hyperlipidemia    Social History   Social History  . Marital Status: Single    Spouse Name: N/A  . Number of Children: N/A  . Years of Education: N/A   Social History Main Topics  . Smoking status: Former Research scientist (life sciences)  . Smokeless tobacco: Never Used  . Alcohol Use: No  . Drug Use: No  . Sexual Activity: Not Asked   Other Topics Concern  . None   Social History Narrative   Family History  Problem Relation Age of Onset  . Hypertension Mother   . Hypertension Father   . Diabetes Mellitus II Brother    Scheduled Meds: . heparin subcutaneous  5,000 Units Subcutaneous 3 times per day  . ipratropium-albuterol  3 mL Nebulization Q6H  . pantoprazole (PROTONIX) IV  40 mg Intravenous Q24H  . piperacillin-tazobactam (ZOSYN)  IV  3.375 g Intravenous 3 times per day  . vancomycin  750 mg Intravenous Q24H   Continuous Infusions: . sodium chloride 125 mL/hr at 09/10/15 0641  . dextrose 50 mL (09/10/15 0917)   PRN Meds:.albuterol, LORazepam, sodium chloride Medications Prior to Admission:  Prior to Admission medications   Medication Sig Start Date End Date Taking? Authorizing Provider  acetaminophen (TYLENOL) 650 MG CR tablet Take 650 mg by mouth every 8 (eight) hours.    Historical Provider, MD  atorvastatin (LIPITOR) 10 MG tablet Take 10 mg by mouth at bedtime.    Historical Provider, MD  brimonidine (ALPHAGAN) 0.2 % ophthalmic solution Place 1 drop into both eyes 3 (three) times daily.    Historical Provider, MD  carvedilol (COREG) 6.25 MG tablet Take 6.25 mg by mouth 2 (two) times daily. With food. 08/23/14   Historical Provider, MD  donepezil (ARICEPT) 10 MG tablet Take 10 mg by mouth 2 (two) times daily.     Historical Provider, MD  dorzolamide-timolol (COSOPT) 22.3-6.8 MG/ML ophthalmic solution Place 1 drop into the right eye 2 (two) times daily.    Historical Provider, MD  guaiFENesin (MUCINEX)  600 MG 12 hr tablet Take 600 mg by mouth 2 (two) times daily as needed for to loosen phlegm.    Historical Provider, MD  lidocaine (LIDODERM) 5 % Place 1 patch onto the skin daily. Remove & Discard patch within 12 hours or as directed by MD 08/16/15   Barton Dubois, MD  LORazepam (ATIVAN) 0.5 MG tablet Take 1 tablet (0.5 mg total) by mouth every 12 (twelve) hours as needed for anxiety (insomnia). 08/16/15   Barton Dubois, MD  megestrol (MEGACE) 400 MG/10ML suspension Take 10 mLs (400 mg total) by mouth 2 (two) times daily. 08/06/15   Orlie Dakin, MD  memantine (NAMENDA) 10 MG tablet Take 10 mg by mouth 2 (two) times daily.    Historical Provider, MD  mirtazapine (REMERON) 15 MG tablet Take 15 mg by mouth at bedtime.    Historical Provider, MD  NUTRITIONAL SUPPLEMENT LIQD Take 1 Can by mouth 3 (three) times daily. Between meals. "Med Pass."  Historical Provider, MD  oxyCODONE (OXY IR/ROXICODONE) 5 MG immediate release tablet Take 1 tablet (5 mg total) by mouth every 6 (six) hours as needed for severe pain (please give to patient prior to bathing / cleaning / physical therapy.). 08/16/15   Barton Dubois, MD  senna-docusate (SENOKOT-S) 8.6-50 MG per tablet Take 1 tablet by mouth at bedtime as needed for mild constipation. 03/22/15   [name], MD  verapamil (CALAN-SR) 240 MG CR tablet Take 240 mg by mouth daily.     Historical Provider, MD   No Known Allergies  Review of Systems:  Patient unconscious  Physical Exam  cachetically thin male, unconscious, with a ventimask in place.  Strong odor of infection.  +Pallor.  Brother at bedside. CV:  Bradycardic Respirations:  Increased work of breathing, ventimask Extremities:  Cool  Vital Signs: BP 118/55 mmHg  Pulse 59  Temp(Src) 96.8 F (36 C) (Axillary)  Resp 26  Ht 6' (1.829 m)  Wt 74.6 kg (164 lb 7.4 oz)  BMI 22.30 kg/m2  SpO2 100%  SpO2: SpO2: 100 % O2 Device:SpO2: 100 % O2 Flow Rate: .O2 Flow Rate (L/min): 15 L/min  IO: Intake/output  summary:  Intake/Output Summary (Last 24 hours) at 09/10/15 1127 Last data filed at 09/10/15 0800  Gross per 24 hour  Intake 1114.58 ml  Output      0 ml  Net 1114.58 ml    LBM:   Baseline Weight: Weight: 80 kg (176 lb 5.9 oz) Most recent weight: Weight: 74.6 kg (164 lb 7.4 oz)      Palliative Assessment/Data:  Flowsheet Rows        Most Recent Value   Intake Tab    Referral Department  Hospitalist   Unit at Time of Referral  Intermediate Care Unit   Palliative Care Primary Diagnosis  Sepsis/Infectious Disease   Date Notified  09/10/15   Palliative Care Type  New Palliative care   Reason for referral  End of Life Care Assistance   Date of Admission  09/10/15   Date first seen by Palliative Care  09/10/15   # of days Palliative referral response time  0 Day(s)   # of days IP prior to Palliative referral  0   Clinical Assessment    Palliative Performance Scale Score  10%   Pain Max last 24 hours  8   Pain Min Last 24 hours  3   Dyspnea Max Last 24 Hours  8   Dyspnea Min Last 24 hours  3   Psychosocial & Spiritual Assessment    Palliative Care Outcomes    Patient/Family meeting held?  Yes   Who was at the meeting?  Brother, HCPOA   Palliative Care Outcomes  Clarified goals of care, Changed CPR status   Patient/Family wishes: Interventions discontinued/not started   Mechanical Ventilation      Additional Data Reviewed:  CBC:    Component Value Date/Time   WBC 17.6* 09/10/2015 0747   HGB 13.0 09/10/2015 0747   HCT 43.6 09/10/2015 0747   PLT PLATELET CLUMPS NOTED ON SMEAR, UNABLE TO ESTIMATE 09/10/2015 0747   MCV 94.2 09/10/2015 0747   NEUTROABS 13.7* 09/10/2015 0747   LYMPHSABS 0.9 09/10/2015 0747   MONOABS 1.4* 09/10/2015 0747   EOSABS 1.6* 09/10/2015 0747   BASOSABS 0.0 09/10/2015 0747   Comprehensive Metabolic Panel:    Component Value Date/Time   NA 147* 09/29/2015 1626   K 4.0 09/12/2015 1626   CL 106 09/15/2015 1626  CO2 16* 10/01/2015 1626   BUN 64*  09/04/2015 1626   CREATININE 2.22* 09/29/2015 1626   GLUCOSE 167* 09/27/2015 1626   CALCIUM 10.6* 09/07/2015 1626   AST 35 09/24/2015 1626   ALT 15* 09/22/2015 1626   ALKPHOS 199* 09/28/2015 1626   BILITOT 1.4* 09/05/2015 1626   PROT 8.0 09/08/2015 1626   ALBUMIN 2.5* 09/15/2015 1626     Time In: 11:10 Time Out: 12:20 Time Total: 70 min Greater than 50%  of this time was spent counseling and coordinating care related to the above assessment and plan.  Signed by: Imogene Burn, PA-C Palliative Medicine Pager: [phone]  09/10/2015, 11:27 AM  Please contact Palliative Medicine Team phone at [phone] for questions and concerns.

## 2015-09-11 ENCOUNTER — Inpatient Hospital Stay (HOSPITAL_COMMUNITY): Payer: Medicare Other

## 2015-09-11 DIAGNOSIS — G893 Neoplasm related pain (acute) (chronic): Secondary | ICD-10-CM | POA: Insufficient documentation

## 2015-09-11 DIAGNOSIS — F411 Generalized anxiety disorder: Secondary | ICD-10-CM | POA: Insufficient documentation

## 2015-09-11 LAB — COMPREHENSIVE METABOLIC PANEL
ALBUMIN: 1.7 g/dL — AB (ref 3.5–5.0)
ALT: 17 U/L (ref 17–63)
ANION GAP: 17 — AB (ref 5–15)
AST: 54 U/L — ABNORMAL HIGH (ref 15–41)
Alkaline Phosphatase: 245 U/L — ABNORMAL HIGH (ref 38–126)
BUN: 64 mg/dL — ABNORMAL HIGH (ref 6–20)
CHLORIDE: 123 mmol/L — AB (ref 101–111)
CO2: 15 mmol/L — AB (ref 22–32)
Calcium: 10.4 mg/dL — ABNORMAL HIGH (ref 8.9–10.3)
Creatinine, Ser: 1.63 mg/dL — ABNORMAL HIGH (ref 0.61–1.24)
GFR calc non Af Amer: 37 mL/min — ABNORMAL LOW (ref >60)
GFR, EST AFRICAN AMERICAN: 42 mL/min — AB (ref >60)
GLUCOSE: 144 mg/dL — AB (ref 65–99)
Potassium: 3.7 mmol/L (ref 3.5–5.1)
SODIUM: 155 mmol/L — AB (ref 135–145)
Total Bilirubin: 0.9 mg/dL (ref 0.3–1.2)
Total Protein: 6.3 g/dL — ABNORMAL LOW (ref 6.5–8.1)

## 2015-09-11 LAB — DIFFERENTIAL
BASOS ABS: 0 10*3/uL (ref 0.0–0.1)
Basophils Relative: 0 %
EOS PCT: 1 %
Eosinophils Absolute: 0.2 10*3/uL (ref 0.0–0.7)
LYMPHS PCT: 6 %
Lymphs Abs: 1.2 10*3/uL (ref 0.7–4.0)
MONOS PCT: 7 %
Monocytes Absolute: 1.4 10*3/uL — ABNORMAL HIGH (ref 0.1–1.0)
Neutro Abs: 16.8 10*3/uL — ABNORMAL HIGH (ref 1.7–7.7)
Neutrophils Relative %: 86 %

## 2015-09-11 LAB — CBC
HEMATOCRIT: 40.2 % (ref 39.0–52.0)
HEMOGLOBIN: 12.8 g/dL — AB (ref 13.0–17.0)
MCH: 27.9 pg (ref 26.0–34.0)
MCHC: 31.8 g/dL (ref 30.0–36.0)
MCV: 87.8 fL (ref 78.0–100.0)
Platelets: 221 10*3/uL (ref 150–400)
RBC: 4.58 MIL/uL (ref 4.22–5.81)
RDW: 16.3 % — AB (ref 11.5–15.5)
WBC: 19.6 10*3/uL — AB (ref 4.0–10.5)

## 2015-09-11 LAB — GLUCOSE, CAPILLARY
GLUCOSE-CAPILLARY: 84 mg/dL (ref 65–99)
GLUCOSE-CAPILLARY: 106 mg/dL — AB (ref 65–99)
GLUCOSE-CAPILLARY: 137 mg/dL — AB (ref 65–99)
Glucose-Capillary: 119 mg/dL — ABNORMAL HIGH (ref 65–99)
Glucose-Capillary: 51 mg/dL — ABNORMAL LOW (ref 65–99)
Glucose-Capillary: 57 mg/dL — ABNORMAL LOW (ref 65–99)

## 2015-09-11 LAB — MAGNESIUM: MAGNESIUM: 2.1 mg/dL (ref 1.7–2.4)

## 2015-09-11 MED ORDER — GLYCOPYRROLATE 0.2 MG/ML IJ SOLN
0.2000 mg | INTRAMUSCULAR | Status: DC | PRN
  Filled 2015-09-11: qty 1

## 2015-09-11 MED ORDER — DEXTROSE 50 % IV SOLN
1.0000 | Freq: Once | INTRAVENOUS | Status: AC
  Administered 2015-09-11: 50 mL via INTRAVENOUS

## 2015-09-11 MED ORDER — MORPHINE SULFATE (PF) 2 MG/ML IV SOLN
1.0000 mg | INTRAVENOUS | Status: DC
  Administered 2015-09-11 – 2015-09-12 (×9): 1 mg via INTRAVENOUS
  Filled 2015-09-11 (×9): qty 1

## 2015-09-11 MED ORDER — DEXTROSE 50 % IV SOLN
25.0000 mL | Freq: Once | INTRAVENOUS | Status: AC
Start: 2015-09-11 — End: 2015-09-11
  Administered 2015-09-11: 25 mL via INTRAVENOUS

## 2015-09-11 MED ORDER — DEXTROSE 50 % IV SOLN
INTRAVENOUS | Status: AC
Start: 2015-09-11 — End: 2015-09-11
  Filled 2015-09-11: qty 50

## 2015-09-11 MED ORDER — DEXTROSE 5 % IV SOLN: INTRAVENOUS | Status: DC

## 2015-09-11 MED ORDER — DEXTROSE 5 % IV SOLN
INTRAVENOUS | Status: AC
  Administered 2015-09-11: 13:00:00 via INTRAVENOUS

## 2015-09-11 NOTE — Progress Notes (Signed)
Patient Demographics:    Kent Mayo, is a 82 y.o. male, DOB - [birth year], [id number]  Admit date - 09/21/2015   Admitting Physician Reubin Milan, MD  Outpatient Primary MD for the patient is No primary care provider on file.  LOS - 2   Chief Complaint  Patient presents with  . Altered Mental Status        Subjective:    Kent Mayo today in bed, obtunded and unable to answer questions.   Assessment  & Plan :     1. Severe sepsis with acute renal failure. After detailed examination cause found to be scrotal abscess with possible Fournier's gangrene. Have consulted PCCM along with urology, Med Rx only, per PCCM poor candidate for surgery, continue IV fluids along with empiric IV antibiotics, prognosis appears grave.   Discussed his poor prognosis with his brother who is the power of attorney. Told him clearly that patient might die in the OR and otherwise. Off note patient has prostate cancer and his quality of life is poor to begin with. Patient's brother the power of attorney & in discussion with patient's son has now decided Med Rx and DNR, Pall care following.   2. Acute respiratory failure secondary to sepsis and metabolic acidosis requiring compensation. Continue nonrebreather mask. Continue supportive care.   3. ARF on CK D stage III. Baseline creatinine 1.6, worse due to sepsis, hydrate and monitor. Obtain renal ultrasound as well.   4. Hypoglycemia. Due to sepsis. For now supportive care for sepsis along with D5W drip.   5. History of prostate cancer with metastasis to bone, adrenals. Poor prognosis. Once stable follow with primary care physician and urologist in the outpatient setting.   6. Toxic encephalopathy due to #1 above. Head CT nonacute. Supportive care.     7. Essential hypertension. Blood pressure soft due to sepsis. IV fluids, hold blood pressure medications.    8. Metabolic acidosis due to elevated lactate from sepsis. Supportive care and hydration.    9. Elevated LFTs. Also likely reactionary to sepsis. No right upper quadrant tenderness. Check RUQ [place] and trend LFTs.    Code Status : DO NOT RESUSCITATE  Family Communication  : Discussed with his brother, currently DO NOT RESUSCITATE, described to him poor prognosis, he will discuss patient's case with patient's son and call us back about any potential surgery. Clearly described to him that patient might pass away during surgery.  Disposition Plan  : Stay in stepdown  Consults  :  PCCM, urology  Procedures  :   CT head nonacute.  Ordered stat scrotal ultrasound along with renal ultrasound at 8 AM on 09/10/2015  DVT Prophylaxis  :  Heparin ordered  Lab Results  Component Value Date   PLT 221 09/11/2015    Inpatient Medications  Scheduled Meds: . heparin subcutaneous  5,000 Units Subcutaneous 3 times per day  . ipratropium-albuterol  3 mL Nebulization Q6H  .  morphine injection  1 mg Intravenous Q2H  . pantoprazole (PROTONIX) IV  40 mg Intravenous Q24H  . piperacillin-tazobactam (ZOSYN)  IV  3.375 g Intravenous 3 times per day  . vancomycin  750 mg Intravenous Q24H   Continuous Infusions: . dextrose     PRN Meds:.albuterol, glycopyrrolate,  LORazepam, morphine injection, sodium chloride  Antibiotics  :     Anti-infectives    Start     Dose/Rate Route Frequency Ordered Stop   09/10/15 1700  vancomycin (VANCOCIN) IVPB 750 mg/150 ml premix     750 mg 150 mL/hr over 60 Minutes Intravenous Every 24 hours 09/08/2015 1734     09/10/15 1700  piperacillin-tazobactam (ZOSYN) IVPB 3.375 g     3.375 g 12.5 mL/hr over 240 Minutes Intravenous 3 times per day 09/11/2015 1734     09/21/2015 1645  piperacillin-tazobactam (ZOSYN) IVPB 3.375 g     3.375 g 100 mL/hr over 30 Minutes  Intravenous  Once 09/24/2015 1637 09/22/2015 1728   09/10/2015 1645  vancomycin (VANCOCIN) IVPB 1000 mg/200 mL premix  Status:  Discontinued     1,000 mg 200 mL/hr over 60 Minutes Intravenous  Once 09/24/2015 1637 09/14/2015 1640   09/17/2015 1645  vancomycin (VANCOCIN) 1,500 mg in sodium chloride 0.9 % 500 mL IVPB     1,500 mg 250 mL/hr over 120 Minutes Intravenous  Once 09/08/2015 1641 09/04/2015 1912        Objective:   Filed Vitals:   09/11/15 0600 09/11/15 0700 09/11/15 1023 09/11/15 1100  BP: 131/65 128/59  128/59  Pulse: 61 65  64  Temp:  97.5 F (36.4 C)  97.6 F (36.4 C)  TempSrc:  Axillary  Axillary  Resp: 19 19  28   Height:      Weight:      SpO2: 100% 100% 100% 90%    Wt Readings from Last 3 Encounters:  09/30/2015 74.6 kg (164 lb 7.4 oz)  08/16/15 79.969 kg (176 lb 4.8 oz)  04/01/15 103.4 kg (227 lb 15.3 oz)     Intake/Output Summary (Last 24 hours) at 09/11/15 1247 Last data filed at 09/11/15 0600  Gross per 24 hour  Intake   3100 ml  Output    500 ml  Net   2600 ml     Physical Exam  Patient is obtunded and unable to move his extremities to verbal commands, does withdraw to pain, [place].AT,PERRAL Supple Neck,No JVD, No cervical lymphadenopathy appriciated.  Symmetrical Chest wall movement, Good air movement bilaterally, CTAB RRR,No Gallops,Rubs or new Murmurs, No Parasternal Heave +ve B.Sounds, Abd Soft, No tenderness, No organomegaly appriciated, No rebound - guarding or rigidity. Swollen and gangrenous looking scrotum with foul smell No Cyanosis, Clubbing or edema, No new Rash or bruise       Data Review:   Micro Results Recent Results (from the past 240 hour(s))  Blood Culture (routine x 2)     Status: None (Preliminary result)   Collection Time: 09/28/2015  4:26 PM  Result Value Ref Range Status   Specimen Description BLOOD RIGHT ANTECUBITAL  Final   Special Requests BOTTLES DRAWN AEROBIC AND ANAEROBIC 5CC  Final   Culture NO GROWTH < 24 HOURS  Final    Report Status PENDING  Incomplete  Blood Culture (routine x 2)     Status: None (Preliminary result)   Collection Time: 09/06/2015  4:47 PM  Result Value Ref Range Status   Specimen Description BLOOD RIGHT ARM  Final   Special Requests BOTTLES DRAWN AEROBIC ONLY [place]  Final   Culture NO GROWTH < 24 HOURS  Final   Report Status PENDING  Incomplete  MRSA PCR Screening     Status: None   Collection Time: 09/10/15 12:53 AM  Result Value Ref Range Status   MRSA by PCR NEGATIVE NEGATIVE  Final    Comment:        The GeneXpert MRSA Assay (FDA approved for NASAL specimens only), is one component of a comprehensive MRSA colonization surveillance program. It is not intended to diagnose MRSA infection nor to guide or monitor treatment for MRSA infections.     Radiology Reports     Ct Head Wo Contrast  09/24/2015  CLINICAL DATA:  Decreased level of consciousness. Decreased O2 sats. Tachypnea, tachycardia. EXAM: CT HEAD WITHOUT CONTRAST TECHNIQUE: Contiguous axial images were obtained from the base of the skull through the vertex without intravenous contrast. COMPARISON:  08/13/2015 FINDINGS: There is atrophy and chronic small vessel disease changes. No acute intracranial abnormality. Specifically, no hemorrhage, hydrocephalus, mass lesion, acute infarction, or significant intracranial injury. No acute calvarial abnormality. Visualized paranasal sinuses and mastoids clear. Orbital soft tissues unremarkable. IMPRESSION: No acute intracranial abnormality. Atrophy, chronic microvascular disease. Electronically Signed   By: Rolm Baptise M.D.   On: 09/15/2015 18:41      Dg Chest Portable 1 View  09/30/2015  CLINICAL DATA:  Altered mental status.  Rapid respirations. EXAM: PORTABLE CHEST 1 VIEW COMPARISON:  08/11/2015 chest x-ray.  Chest CT 08/12/2015. FINDINGS: COPD with hyperinflation. Normal heart size. LEFT lower lobe atelectasis. LEFT pleural effusion with or without consolidation. Worsening aeration  from priors. Small nodules noted on previous chest CT not visible. IMPRESSION: LEFT lower lobe atelectasis with or without consolidation and LEFT pleural effusion. Slight worsening aeration from priors. Electronically Signed   By: Staci Righter M.D.   On: 09/11/2015 17:18       CBC  Recent Labs Lab 09/21/2015 1626 09/10/15 0747 09/11/15 1011  WBC 24.6* 17.6* 19.6*  HGB 14.3 13.0 12.8*  HCT 44.4 43.6 40.2  PLT 341 PLATELET CLUMPS NOTED ON SMEAR, UNABLE TO ESTIMATE 221  MCV 91.5 94.2 87.8  MCH 29.5 28.1 27.9  MCHC 32.2 29.8* 31.8  RDW 15.8* 19.0* 16.3*  LYMPHSABS 1.0 0.9 1.2  MONOABS 2.2* 1.4* 1.4*  EOSABS 0.0 1.6* 0.2  BASOSABS 0.0 0.0 0.0    Chemistries   Recent Labs Lab 09/20/2015 1626 09/15/2015 2355 09/10/15 1229 09/11/15 1011  NA 147*  --  156* 155*  K 4.0  --  3.2* 3.7  CL 106  --  122* 123*  CO2 16*  --  15* 15*  GLUCOSE 167*  --  150* 144*  BUN 64*  --  62* 64*  CREATININE 2.22*  --  1.79* 1.63*  CALCIUM 10.6*  --  10.2 10.4*  MG  --  2.2  --  2.1  AST 35  --  63* 54*  ALT 15*  --  18 17  ALKPHOS 199*  --  169* 245*  BILITOT 1.4*  --  1.0 0.9   ------------------------------------------------------------------------------------------------------------------ No results for input(s): CHOL, HDL, LDLCALC, TRIG, CHOLHDL, LDLDIRECT in the last 72 hours.  Lab Results  Component Value Date   HGBA1C 5.2 08/12/2015   ------------------------------------------------------------------------------------------------------------------ No results for input(s): TSH, T4TOTAL, T3FREE, THYROIDAB in the last 72 hours.  Invalid input(s): FREET3 ------------------------------------------------------------------------------------------------------------------ No results for input(s): VITAMINB12, FOLATE, FERRITIN, TIBC, IRON, RETICCTPCT in the last 72 hours.  Coagulation profile No results for input(s): INR, PROTIME in the last 168 hours.  No results for input(s): DDIMER in  the last 72 hours.  Cardiac Enzymes No results for input(s): CKMB, TROPONINI, MYOGLOBIN in the last 168 hours.  Invalid input(s): CK ------------------------------------------------------------------------------------------------------------------ No results found for: BNP  Time Spent in minutes   35  Thurnell Lose M.D on 09/11/2015 at 12:47 PM  Between 7am to 7pm - Pager - [phone]  After 7pm go to [email or web address] - password [facility]  [facility] -  Office  [phone]

## 2015-09-11 NOTE — Progress Notes (Signed)
Nutrition Brief Note  Chart reviewed. Pt now transitioning to comfort care.  No further nutrition interventions warranted at this time.  Please re-consult as needed.   Itxel Wickard A. Shayleigh Bouldin, RD, LDN, CDE Pager: [phone] After hours Pager: [phone]  

## 2015-09-11 NOTE — Clinical Documentation Improvement (Signed)
Internal Medicine  Can the diagnosis of CKD be further specified?   CKD Stage I - GFR greater than or equal to 90  CKD Stage II - GFR 60-89  CKD Stage III - GFR 30-59  CKD Stage IV - GFR 15-29  CKD Stage V - GFR < 15  ESRD (End Stage Renal Disease)  Other condition  Unable to clinically determine  Please update your documentation within the medical record to reflect your response to this query. Thank you.  Supporting Information:  (As per notes) "Renal failure (ARF), acute on chronic ([place])"  Continue IV hydration.  Follow-up BUN/creatinine and electrolytes.  Check urine spot lytes and random creatinine.  Consider expanding workup or consulting nephrology if no improvement.   Labs: Component     Latest Ref Rng 09/15/2015 09/10/2015           BUN     6 - 20 mg/dL 64 (H) 62 (H)  Creatinine     0.61 - 1.24 mg/dL 2.22 (H) 1.79 (H)  EGFR (Non-African Amer.)     >60 mL/min 25 (L) 33 (L)  EGFR (African American)     >60 mL/min 29 (L) 38 (L)    Please exercise your independent, professional judgment when responding. A specific answer is not anticipated or expected.  Thank You, Alessandra Grout, RN, BSN, CCDS,Clinical Documentation Specialist:  [phone]  [phone]=[facility] [place]- Health Information Management

## 2015-09-11 NOTE — Progress Notes (Addendum)
Pt has orders for transfer to [place]. MD notified of transfer orders but keeping pt in [place] based on note written by the attending MD for the pt to stay in SDU.   Transfer orders DC'ed.  Paulene Floor, RN 09/11/2015  22:30

## 2015-09-11 NOTE — Progress Notes (Signed)
Daily Progress Note   Patient Name: Kent Mayo       Date: 09/11/2015 DOB: 01/24/1927  Age: 82 y.o. MRN#: [id number] Attending Physician: Thurnell Lose, MD Primary Care Physician: No primary care provider on file. Admit Date: 09/21/2015  Reason for Consultation/Follow-up: Establishing goals of care and Terminal Care  Subjective: Patient unable to speak   Interval Events:  Patient is unable to interact.  Yesterday family had requested that he receive full care with the exception of surgery and resuscitation.  Antibiotics and IVF have been continued.  Today patient appears to be transitioning, actively dying.  His lung sounds have now become gurgled - I believe this is due to the IVF.   I have a telephone call into Rolan Bucco as he is not at bedside.  I will explain that the patient is actively dying and seek his permission to provide total comfort care.    Length of Stay: 2 days  Current Medications: Scheduled Meds:  . heparin subcutaneous  5,000 Units Subcutaneous 3 times per day  . ipratropium-albuterol  3 mL Nebulization Q6H  .  morphine injection  1 mg Intravenous Q2H  . pantoprazole (PROTONIX) IV  40 mg Intravenous Q24H  . piperacillin-tazobactam (ZOSYN)  IV  3.375 g Intravenous 3 times per day  . vancomycin  750 mg Intravenous Q24H    Continuous Infusions: . sodium chloride Stopped (09/11/15 0554)  . dextrose      PRN Meds: albuterol, LORazepam, morphine injection, sodium chloride   Physical Exam  Patient appears to be actively dying.  Non verbal, furrowed brow CV:  Bradycardic but regular Resp:  Sound wet.  Patient has mildly increased work of breathing.              Vital Signs: BP 128/59 mmHg  Pulse 65  Temp(Src) 97.5 F (36.4 C) (Axillary)  Resp 19  Ht 6'  (1.829 m)  Wt 74.6 kg (164 lb 7.4 oz)  BMI 22.30 kg/m2  SpO2 100% SpO2: SpO2: 100 % O2 Device: O2 Device: Nasal Cannula O2 Flow Rate: O2 Flow Rate (L/min): 6 L/min  Intake/output summary:  Intake/Output Summary (Last 24 hours) at 09/11/15 0933 Last data filed at 09/11/15 0600  Gross per 24 hour  Intake 3610.83 ml  Output    500 ml  Net 3110.83  ml   LBM:   Baseline Weight: Weight: 80 kg (176 lb 5.9 oz) Most recent weight: Weight: 74.6 kg (164 lb 7.4 oz)       Palliative Assessment/Data: Flowsheet Rows        Most Recent Value   Intake Tab    Referral Department  Hospitalist   Unit at Time of Referral  Intermediate Care Unit   Palliative Care Primary Diagnosis  Sepsis/Infectious Disease   Date Notified  09/10/15   Palliative Care Type  New Palliative care   Reason for referral  End of Life Care Assistance   Date of Admission  09/10/15   Date first seen by Palliative Care  09/10/15   # of days Palliative referral response time  0 Day(s)   # of days IP prior to Palliative referral  0   Clinical Assessment    Palliative Performance Scale Score  10%   Pain Max last 24 hours  8   Pain Min Last 24 hours  3   Dyspnea Max Last 24 Hours  8   Dyspnea Min Last 24 hours  3   Psychosocial & Spiritual Assessment    Palliative Care Outcomes    Patient/Family meeting held?  Yes   Who was at the meeting?  Brother, HCPOA   Palliative Care Outcomes  Clarified goals of care, Changed CPR status   Patient/Family wishes: Interventions discontinued/not started   Mechanical Ventilation      Additional Data Reviewed: CBC    Component Value Date/Time   WBC 17.6* 09/10/2015 0747   RBC 4.63 09/10/2015 0747   HGB 13.0 09/10/2015 0747   HCT 43.6 09/10/2015 0747   PLT PLATELET CLUMPS NOTED ON SMEAR, UNABLE TO ESTIMATE 09/10/2015 0747   MCV 94.2 09/10/2015 0747   MCH 28.1 09/10/2015 0747   MCHC 29.8* 09/10/2015 0747   RDW 19.0* 09/10/2015 0747   LYMPHSABS 0.9 09/10/2015 0747   MONOABS  1.4* 09/10/2015 0747   EOSABS 1.6* 09/10/2015 0747   BASOSABS 0.0 09/10/2015 0747    CMP     Component Value Date/Time   NA 156* 09/10/2015 1229   K 3.2* 09/10/2015 1229   CL 122* 09/10/2015 1229   CO2 15* 09/10/2015 1229   GLUCOSE 150* 09/10/2015 1229   BUN 62* 09/10/2015 1229   CREATININE 1.79* 09/10/2015 1229   CALCIUM 10.2 09/10/2015 1229   PROT 6.6 09/10/2015 1229   ALBUMIN 1.9* 09/10/2015 1229   AST 63* 09/10/2015 1229   ALT 18 09/10/2015 1229   ALKPHOS 169* 09/10/2015 1229   BILITOT 1.0 09/10/2015 1229   GFRNONAA 33* 09/10/2015 1229   GFRAA 38* 09/10/2015 1229       Problem List:  Patient Active Problem List   Diagnosis Date Noted  . Prostate cancer metastatic to multiple sites [facility]) 08/13/2015    Priority: High  . Anorexia 08/13/2015    Priority: High  . 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 Scrotal abscess possible fournier's 09/10/2015  . Pressure ulcer 09/10/2015  . Severe sepsis ([place]) 09/23/2015  . Altered mental status 09/08/2015  . Acute respiratory failure ([place]) 09/29/2015  . Hypernatremia 09/12/2015  . Lactic acidosis 09/24/2015  . Abnormal LFTs 09/17/2015  . Physical deconditioning 08/19/2015  . Bilateral low back pain without sciatica   . H/O foreign body in eye   . Hydronephrosis   . Severe protein-calorie malnutrition ([place])   . Palliative care encounter 08/13/2015  . Dementia 08/13/2015  . Malignancy (  [place])   . Syncope and collapse 08/11/2015  . Fall at nursing home 08/11/2015  . Renal failure (ARF), acute on chronic (HCC) 08/11/2015  . Fall 08/11/2015  . UTI (urinary tract infection) 03/30/2015  . Acute renal failure ([place]) 03/29/2015  . Hyperammonemia ([place])   . Urinary retention 03/20/2015  . Acute encephalopathy 03/20/2015  . AKI (acute kidney injury) ([place])   . Leukocytosis   . CN (constipation)   . ARF (acute renal failure) ([place]) 03/18/2015  . History of CVA (cerebrovascular accident)  03/18/2015  . FTT (failure to thrive) in adult 03/18/2015  . CVA (cerebral vascular accident) ([place]) 04/26/2013  . 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 Dizziness and giddiness 02/23/2013  . HTN (hypertension) 02/23/2013  . Diabetes mellitus ([place]) 02/23/2013  . Hyperlipidemia 02/23/2013     Palliative Care Assessment & Plan    1.Code Status:  DNR    Code Status Orders        Start     Ordered   09/30/2015 2256  Do not attempt resuscitation (DNR)   Continuous    Question Answer Comment  In the event of cardiac or respiratory ARREST Do not call a "code blue"   In the event of cardiac or respiratory ARREST Do not perform Intubation, CPR, defibrillation or ACLS   In the event of cardiac or respiratory ARREST Use medication by any route, position, wound care, and other measures to relive pain and suffering. May use oxygen, suction and manual treatment of airway obstruction as needed for comfort.      09/29/2015 2255    Goals of Care/Additional Recommendations:  DNR  Family requests treatment including antibiotics be continued.   I will seek their permission to make him full comfort care as this is the best most appropriate treatment at this point  I have scheduled very low dose morphine at the patient appears uncomfortable and is unable to request pain medications himself.  Also add robinul prn secretions.  Prognosis: Hours - Days  Discharge Planning:  Anticipated Hospital Death   Care plan was discussed with RN.  Left message for patients HCPOA, Rolan Bucco  Thank you for allowing the Palliative Medicine Team to assist in the care of this patient.   Time In: 9:15 Time Out: 9:40 Total Time 25 min Prolonged Time Billed no       Imogene Burn, [place] [facility] Pager: [phone]  09/11/2015, 9:33 AM  Please contact Palliative Medicine Team phone at [phone] for questions and concerns.

## 2015-09-11 NOTE — Progress Notes (Signed)
Patient is currently on [facility] at this time and all vitals are stable. BIPAP is not needed at [facility] time.

## 2015-09-12 DIAGNOSIS — R0600 Dyspnea, unspecified: Secondary | ICD-10-CM

## 2015-09-12 LAB — COMPREHENSIVE METABOLIC PANEL
ALBUMIN: 1.6 g/dL — AB (ref 3.5–5.0)
ALT: 12 U/L — ABNORMAL LOW (ref 17–63)
ANION GAP: 18 — AB (ref 5–15)
AST: 33 U/L (ref 15–41)
Alkaline Phosphatase: 221 U/L — ABNORMAL HIGH (ref 38–126)
BUN: 70 mg/dL — ABNORMAL HIGH (ref 6–20)
CO2: 15 mmol/L — AB (ref 22–32)
Calcium: 10.3 mg/dL (ref 8.9–10.3)
Chloride: 119 mmol/L — ABNORMAL HIGH (ref 101–111)
Creatinine, Ser: 1.95 mg/dL — ABNORMAL HIGH (ref 0.61–1.24)
GFR calc Af Amer: 34 mL/min — ABNORMAL LOW (ref >60)
GFR calc non Af Amer: 29 mL/min — ABNORMAL LOW (ref >60)
GLUCOSE: 163 mg/dL — AB (ref 65–99)
POTASSIUM: 4.5 mmol/L (ref 3.5–5.1)
SODIUM: 152 mmol/L — AB (ref 135–145)
Total Bilirubin: 0.8 mg/dL (ref 0.3–1.2)
Total Protein: 6.1 g/dL — ABNORMAL LOW (ref 6.5–8.1)

## 2015-09-12 LAB — CBC WITH DIFFERENTIAL/PLATELET
BASOS ABS: 0 10*3/uL (ref 0.0–0.1)
BASOS PCT: 0 %
EOS PCT: 0 %
Eosinophils Absolute: 0 10*3/uL (ref 0.0–0.7)
HEMATOCRIT: 41.7 % (ref 39.0–52.0)
HEMOGLOBIN: 13 g/dL (ref 13.0–17.0)
LYMPHS PCT: 5 %
Lymphs Abs: 1.2 10*3/uL (ref 0.7–4.0)
MCH: 28.8 pg (ref 26.0–34.0)
MCHC: 31.2 g/dL (ref 30.0–36.0)
MCV: 92.5 fL (ref 78.0–100.0)
MONOS PCT: 7 %
Monocytes Absolute: 1.7 10*3/uL — ABNORMAL HIGH (ref 0.1–1.0)
NEUTROS ABS: 22 10*3/uL — AB (ref 1.7–7.7)
Neutrophils Relative %: 88 %
PLATELETS: 226 10*3/uL (ref 150–400)
RBC: 4.51 MIL/uL (ref 4.22–5.81)
RDW: 17.1 % — ABNORMAL HIGH (ref 11.5–15.5)
WBC: 24.9 10*3/uL — ABNORMAL HIGH (ref 4.0–10.5)

## 2015-09-12 MED ORDER — MORPHINE SULFATE 25 MG/ML IV SOLN
1.0000 mg/h | INTRAVENOUS | Status: DC
  Administered 2015-09-12: 1 mg/h via INTRAVENOUS
  Filled 2015-09-12: qty 10

## 2015-09-12 MED ORDER — IPRATROPIUM-ALBUTEROL 0.5-2.5 (3) MG/3ML IN SOLN: 3.0000 mL | RESPIRATORY_TRACT | Status: DC | PRN

## 2015-09-12 MED ORDER — LORAZEPAM 2 MG/ML IJ SOLN
0.5000 mg | Freq: Two times a day (BID) | INTRAMUSCULAR | Status: DC
  Administered 2015-09-12: 0.5 mg via INTRAVENOUS
  Filled 2015-09-12: qty 1

## 2015-09-12 MED ORDER — SODIUM CHLORIDE 0.9 % IV SOLN
1.5000 g | Freq: Two times a day (BID) | INTRAVENOUS | Status: DC
  Administered 2015-09-12 – 2015-09-13 (×2): 1.5 g via INTRAVENOUS
  Filled 2015-09-12 (×4): qty 1.5

## 2015-09-12 NOTE — Progress Notes (Signed)
Daily Progress Note   Patient Name: Kent Mayo       Date: 09/12/2015 DOB: May 04, 1927  Age: 82 y.o. MRN#: [id number] Attending Physician: Thurnell Lose, MD Primary Care Physician: No primary care provider on file. Admit Date: 09/04/2015  Reason for Consultation/Follow-up: Establishing goals of care and Terminal Care  Subjective: Patient sleeping unable to speak.  Actively dying.  Interval Events:  Met with brother Rolan Bucco at bedside.  Patient appears reasonably comfortable.     Length of Stay: 3 days  Current Medications: Scheduled Meds:  . ampicillin-sulbactam (UNASYN) IV  1.5 g Intravenous Q12H  . ipratropium-albuterol  3 mL Nebulization Q6H  .  morphine injection  1 mg Intravenous Q2H    Continuous Infusions:    PRN Meds: albuterol, glycopyrrolate, LORazepam, morphine injection   Physical Exam  Patient appears to be actively dying.  Non verbal, shallow quick breaths.  Smell of gangrene. CV:  Bradycardic but regular Resp:  Sound wet.  Patient has mildly increased work of breathing.              Vital Signs: BP 102/48 mmHg  Pulse 72  Temp(Src) 96.6 F (35.9 C) (Axillary)  Resp 36  Ht 6' (1.829 m)  Wt 74.6 kg (164 lb 7.4 oz)  BMI 22.30 kg/m2  SpO2 94% SpO2: SpO2: 94 % O2 Device: O2 Device: Nasal Cannula O2 Flow Rate: O2 Flow Rate (L/min): 6 L/min  Intake/output summary:   Intake/Output Summary (Last 24 hours) at 09/12/15 1548 Last data filed at 09/12/15 0800  Gross per 24 hour  Intake   1100 ml  Output    450 ml  Net    650 ml   LBM:   Baseline Weight: Weight: 80 kg (176 lb 5.9 oz) Most recent weight: Weight: 74.6 kg (164 lb 7.4 oz)       Palliative Assessment/Data: Flowsheet Rows        Most Recent Value   Intake Tab    Referral Department   Hospitalist   Unit at Time of Referral  Intermediate Care Unit   Palliative Care Primary Diagnosis  Sepsis/Infectious Disease   Date Notified  09/10/15   Palliative Care Type  New Palliative care   Reason for referral  End of Life Care Assistance   Date of Admission  09/10/15  Date first seen by Palliative Care  09/10/15   # of days Palliative referral response time  0 Day(s)   # of days IP prior to Palliative referral  0   Clinical Assessment    Palliative Performance Scale Score  10%   Pain Max last 24 hours  8   Pain Min Last 24 hours  3   Dyspnea Max Last 24 Hours  8   Dyspnea Min Last 24 hours  3   Psychosocial & Spiritual Assessment    Palliative Care Outcomes    Patient/Family meeting held?  Yes   Who was at the meeting?  Brother, HCPOA   Palliative Care Outcomes  Clarified goals of care, Changed CPR status   Patient/Family wishes: Interventions discontinued/not started   Mechanical Ventilation      Additional Data Reviewed: CBC    Component Value Date/Time   WBC 24.9* 09/12/2015 0521   RBC 4.51 09/12/2015 0521   HGB 13.0 09/12/2015 0521   HCT 41.7 09/12/2015 0521   PLT 226 09/12/2015 0521   MCV 92.5 09/12/2015 0521   MCH 28.8 09/12/2015 0521   MCHC 31.2 09/12/2015 0521   RDW 17.1* 09/12/2015 0521   LYMPHSABS 1.2 09/12/2015 0521   MONOABS 1.7* 09/12/2015 0521   EOSABS 0.0 09/12/2015 0521   BASOSABS 0.0 09/12/2015 0521    CMP     Component Value Date/Time   NA 152* 09/12/2015 0521   K 4.5 09/12/2015 0521   CL 119* 09/12/2015 0521   CO2 15* 09/12/2015 0521   GLUCOSE 163* 09/12/2015 0521   BUN 70* 09/12/2015 0521   CREATININE 1.95* 09/12/2015 0521   CALCIUM 10.3 09/12/2015 0521   PROT 6.1* 09/12/2015 0521   ALBUMIN 1.6* 09/12/2015 0521   AST 33 09/12/2015 0521   ALT 12* 09/12/2015 0521   ALKPHOS 221* 09/12/2015 0521   BILITOT 0.8 09/12/2015 0521   GFRNONAA 29* 09/12/2015 0521   GFRAA 34* 09/12/2015 0521       Problem List:  Patient Active  Problem List   Diagnosis Date Noted  . Prostate cancer metastatic to multiple sites [facility]) 08/13/2015    Priority: High  . Anorexia 08/13/2015    Priority: High  . Neoplasm related pain   . Anxiety state   . 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 Scrotal abscess possible fournier's 09/10/2015  . Pressure ulcer 09/10/2015  . Severe sepsis ([place]) 10/01/2015  . Altered mental status 09/08/2015  . Acute respiratory failure ([place]) 09/17/2015  . Hypernatremia 09/18/2015  . Lactic acidosis 09/22/2015  . Abnormal LFTs 09/19/2015  . Physical deconditioning 08/19/2015  . Bilateral low back pain without sciatica   . H/O foreign body in eye   . Hydronephrosis   . Severe protein-calorie malnutrition ([place])   . Palliative care encounter 08/13/2015  . Dementia 08/13/2015  . Malignancy ([place])   . Syncope and collapse 08/11/2015  . Fall at nursing home 08/11/2015  . Renal failure (ARF), acute on chronic (HCC) 08/11/2015  . Fall 08/11/2015  . UTI (urinary tract infection) 03/30/2015  . Acute renal failure ([place]) 03/29/2015  . Hyperammonemia ([place])   . Urinary retention 03/20/2015  . Acute encephalopathy 03/20/2015  . AKI (acute kidney injury) ([place])   . Leukocytosis   . CN (constipation)   . ARF (acute renal failure) ([place]) 03/18/2015  . History of CVA (cerebrovascular accident) 03/18/2015  . FTT (failure to thrive) in adult 03/18/2015  . CVA (cerebral vascular accident) ([place])  04/26/2013  . 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 Dizziness and giddiness 02/23/2013  . HTN (hypertension) 02/23/2013  . Diabetes mellitus ([place]) 02/23/2013  . Hyperlipidemia 02/23/2013     Palliative Care Assessment & Plan    1.Code Status:  DNR    Code Status Orders        Start     Ordered   09/22/2015 2256  Do not attempt resuscitation (DNR)   Continuous    Question Answer Comment  In the event of cardiac or respiratory ARREST Do not call a "code blue"   In the  event of cardiac or respiratory ARREST Do not perform Intubation, CPR, defibrillation or ACLS   In the event of cardiac or respiratory ARREST Use medication by any route, position, wound care, and other measures to relive pain and suffering. May use oxygen, suction and manual treatment of airway obstruction as needed for comfort.      09/06/2015 2255    Goals of Care/Additional Recommendations:  DNR  Comfort Care.  Prognosis: Hours - Days  Discharge Planning:  Anticipated Hospital Death   Thank you for allowing the Palliative Medicine Team to assist in the care of this patient.   Time In: 3:30 Time Out: 3:45 Total Time 15 min Prolonged Time Billed no       Imogene Burn, [place] [facility] Pager: [phone]  09/12/2015, 3:48 PM  Please contact Palliative Medicine Team phone at [phone] for questions and concerns.

## 2015-09-12 NOTE — Progress Notes (Signed)
Pt. Remains obtunded and DNR status. Notified patients brother of transfer to [phone] Report called. Transferred via bed by NT's.

## 2015-09-12 NOTE — Progress Notes (Signed)
Kent Mayo is a 82 y.o. male patient admitted from ED awake, alert - oriented  X 4 - no acute distress noted.  VSS - Blood pressure 102/48, pulse 72, temperature 96.6 F (35.9 C), temperature source Axillary, resp. rate 36, height 6' (1.829 m), weight 74.6 kg (164 lb 7.4 oz), SpO2 94 %.    IV in place, occlusive dsg intact without redness.  Orientation to room, pt unresponsive, on 6L O2.   SR up x 2, fall assessment complete. Call light within reach, patient able to voice, and demonstrate understanding.  Pt has healed sacral ulcer with pink foam intact.   Pt has condom cath in place draining clear, yellow urine.  SCDs in place.   Will cont to eval and treat per MD orders.  [place] Nurse, [place] 09/12/2015 7:16 PM

## 2015-09-12 NOTE — Care Management Important Message (Signed)
Important Message  Patient Details  Name: Kent Mayo MRN: [id number] Date of Birth: 25-Aug-1926   Medicare Important Message Given:  Yes    Loann Quill 09/12/2015, 8:03 AM

## 2015-09-12 NOTE — Progress Notes (Signed)
Pharmacy Antibiotic Note  Kent Mayo is a 82 y.o. male admitted on 09/14/2015 with scrotum infection and possible gangrene.  Pharmacy has been consulted for Unasyn dosing. PT previously treated with 3d of vanc and zosyn. WBC 19.6, LA 8, afebrile  Plan: Vancomycin and Zosyn discontinued Unasyn 1.5g IV Q12 hours F/U renal fxn, c/s, LOT   Height: 6' (182.9 cm) Weight: 164 lb 7.4 oz (74.6 kg) IBW/kg (Calculated) : 77.6  Temp (24hrs), Avg:97.2 F (36.2 C), Min:96.6 F (35.9 C), Max:97.6 F (36.4 C)   Recent Labs Lab 09/24/2015 1626 09/05/2015 1650 09/06/2015 2010 09/28/2015 2355 09/10/15 0747 09/10/15 0816 09/10/15 1229 09/11/15 1011 09/12/15 0521  WBC 24.6*  --   --   --  17.6*  --   --  19.6* PENDING  CREATININE 2.22*  --   --   --   --   --  1.79* 1.63* 1.95*  LATICACIDVEN  --  10.00* 8.00* 9.2*  --  8.0*  --   --   --     Estimated Creatinine Clearance: 28.7 mL/min (by C-G formula based on Cr of 1.95).    No Known Allergies  Antimicrobials this admission: 2/6 vanc>>2/9  2/6 zosyn>>2/9  2/9>>unasyn   Microbiology results: 2/7 urine  2/6 blood x2- ngtd  2/7 MRSA PCR- neg  Thank you for allowing pharmacy to be a part of this patient's care.  Kent Mayo C. Lennox Grumbles, PharmD Pharmacy Resident  Pager: [phone] 09/12/2015 9:38 AM

## 2015-09-12 NOTE — Progress Notes (Signed)
Patient Demographics:    Kent Mayo, is a 82 y.o. male, DOB - 1926/12/30, [id number]  Admit date - 09/08/2015   Admitting Physician Reubin Milan, MD  Outpatient Primary MD for the patient is No primary care provider on file.  LOS - 3   Chief Complaint  Patient presents with  . Altered Mental Status        Subjective:    Kent Mayo today in bed, obtunded and unable to answer questions.   Assessment  & Plan :     1. Severe sepsis with acute renal failure. After detailed examination cause found to be scrotal abscess with possible Fournier's gangrene. Have consulted PCCM along with urology, Med Rx only, per PCCM poor candidate for surgery. Discussed his poor prognosis with his brother who is the power of attorney. Told him clearly that patient might die in the OR and otherwise. Of note patient has prostate cancer and his quality of life is poor to begin with. Patient's brother the power of attorney & in discussion with patient's son has now decided Med Rx and DNR, Pall care following.  On 09/12/2015 there is no clinical improvement, he continues to be obtunded, discussed this with patient's brother who is the power of attorney. Encouraged him to think about full comfort care at this time. For now continue IV antibiotics. Palliative care on board.   2. Acute respiratory failure secondary to sepsis and metabolic acidosis requiring compensation. Continue nonrebreather mask. Continue supportive care.   3. Hypernatremia, dehydration and ARF on CK D stage III. Baseline creatinine 1.6, worse due to sepsis, hydrate and monitor. Nonacute renal ultrasound .   4. Hypoglycemia. Due to sepsis. For now supportive care for sepsis along with D5W drip.   5. History of prostate cancer with  metastasis to bone, adrenals, chronic left hydronephrosis. Poor prognosis. Once stable follow with primary care physician and urologist in the outpatient setting.   6. Toxic encephalopathy due to #1 above. Head CT nonacute. Supportive care.    7. Essential hypertension. Blood pressure soft due to sepsis. IV fluids, hold blood pressure medications.    8. Metabolic acidosis due to elevated lactate from sepsis. Supportive care and hydration.    9. Elevated LFTs. Also likely reactionary to sepsis. No right upper quadrant tenderness. Right upper quadrant ultrasound stable from a hepatic standpoint, continue to trend LFTs with supportive care.    Code Status : DO NOT RESUSCITATE  Family Communication  : Discussed with his brother, currently DO NOT RESUSCITATE, described to him poor prognosis, he will discuss patient's case with patient's son and call us back about any potential surgery. Clearly described to him that patient might pass away during surgery.  Disposition Plan  : Stay in stepdown  Consults  :  PCCM, urology  Procedures  :   CT head nonacute.  Ordered stat scrotal ultrasound along with renal ultrasound at 8 AM on 09/10/2015  DVT Prophylaxis  :  Heparin ordered  Lab Results  Component Value Date   PLT PENDING 09/12/2015    Inpatient Medications  Scheduled Meds: . ampicillin-sulbactam (UNASYN) IV  1.5 g Intravenous Q12H  . ipratropium-albuterol  3 mL Nebulization Q6H  .  morphine injection  1 mg Intravenous Q2H  Continuous Infusions: . dextrose 50 mL/hr at 09/11/15 2000   PRN Meds:.albuterol, glycopyrrolate, LORazepam, morphine injection  Antibiotics  :     Anti-infectives    Start     Dose/Rate Route Frequency Ordered Stop   09/12/15 1400  ampicillin-sulbactam (UNASYN) 1.5 g in sodium chloride 0.9 % 50 mL IVPB     1.5 g 100 mL/hr over 30 Minutes Intravenous Every 12 hours 09/12/15 0935     09/10/15 1700  vancomycin (VANCOCIN) IVPB 750 mg/150 ml premix   Status:  Discontinued     750 mg 150 mL/hr over 60 Minutes Intravenous Every 24 hours 09/30/2015 1734 09/12/15 0926   09/10/15 1700  piperacillin-tazobactam (ZOSYN) IVPB 3.375 g  Status:  Discontinued     3.375 g 12.5 mL/hr over 240 Minutes Intravenous 3 times per day 09/16/2015 1734 09/12/15 0926   09/26/2015 1645  piperacillin-tazobactam (ZOSYN) IVPB 3.375 g     3.375 g 100 mL/hr over 30 Minutes Intravenous  Once 09/12/2015 1637 09/06/2015 1728   09/29/2015 1645  vancomycin (VANCOCIN) IVPB 1000 mg/200 mL premix  Status:  Discontinued     1,000 mg 200 mL/hr over 60 Minutes Intravenous  Once 09/16/2015 1637 09/28/2015 1640   09/14/2015 1645  vancomycin (VANCOCIN) 1,500 mg in sodium chloride 0.9 % 500 mL IVPB     1,500 mg 250 mL/hr over 120 Minutes Intravenous  Once 09/19/2015 1641 09/24/2015 1912        Objective:   Filed Vitals:   09/12/15 0738 09/12/15 0743 09/12/15 0800 09/12/15 0950  BP: 102/52   102/48  Pulse: 77  74 74  Temp:  96.6 F (35.9 C)  96.6 F (35.9 C)  TempSrc:  Axillary  Axillary  Resp: 32  32 29  Height:      Weight:      SpO2: 94%  100% 94%    Wt Readings from Last 3 Encounters:  09/18/2015 74.6 kg (164 lb 7.4 oz)  08/16/15 79.969 kg (176 lb 4.8 oz)  04/01/15 103.4 kg (227 lb 15.3 oz)     Intake/Output Summary (Last 24 hours) at 09/12/15 1021 Last data filed at 09/12/15 0800  Gross per 24 hour  Intake 1412.5 ml  Output    450 ml  Net  962.5 ml     Physical Exam  Patient is obtunded and unable to move his extremities to verbal commands, does withdraw to pain, [place].AT,PERRAL Supple Neck,No JVD, No cervical lymphadenopathy appriciated.  Symmetrical Chest wall movement, Good air movement bilaterally, CTAB RRR,No Gallops,Rubs or new Murmurs, No Parasternal Heave +ve B.Sounds, Abd Soft, No tenderness, No organomegaly appriciated, No rebound - guarding or rigidity. Swollen and gangrenous looking scrotum with foul smell No Cyanosis, Clubbing or edema, No new Rash or bruise        Data Review:   Micro Results Recent Results (from the past 240 hour(s))  Blood Culture (routine x 2)     Status: None (Preliminary result)   Collection Time: 09/18/2015  4:26 PM  Result Value Ref Range Status   Specimen Description BLOOD RIGHT ANTECUBITAL  Final   Special Requests BOTTLES DRAWN AEROBIC AND ANAEROBIC 5CC  Final   Culture NO GROWTH 2 DAYS  Final   Report Status PENDING  Incomplete  Blood Culture (routine x 2)     Status: None (Preliminary result)   Collection Time: 09/04/2015  4:47 PM  Result Value Ref Range Status   Specimen Description BLOOD RIGHT ARM  Final   Special Requests BOTTLES DRAWN  AEROBIC ONLY [place]  Final   Culture NO GROWTH 2 DAYS  Final   Report Status PENDING  Incomplete  MRSA PCR Screening     Status: None   Collection Time: 09/10/15 12:53 AM  Result Value Ref Range Status   MRSA by PCR NEGATIVE NEGATIVE Final    Comment:        The GeneXpert MRSA Assay (FDA approved for NASAL specimens only), is one component of a comprehensive MRSA colonization surveillance program. It is not intended to diagnose MRSA infection nor to guide or monitor treatment for MRSA infections.   Urine culture     Status: None (Preliminary result)   Collection Time: 09/10/15  5:16 AM  Result Value Ref Range Status   Specimen Description URINE, RANDOM  Final   Special Requests NONE  Final   Culture CULTURE REINCUBATED FOR BETTER GROWTH  Final   Report Status PENDING  Incomplete    Radiology Reports     Ct Head Wo Contrast  09/17/2015  CLINICAL DATA:  Decreased level of consciousness. Decreased O2 sats. Tachypnea, tachycardia. EXAM: CT HEAD WITHOUT CONTRAST TECHNIQUE: Contiguous axial images were obtained from the base of the skull through the vertex without intravenous contrast. COMPARISON:  08/13/2015 FINDINGS: There is atrophy and chronic small vessel disease changes. No acute intracranial abnormality. Specifically, no hemorrhage, hydrocephalus, mass lesion,  acute infarction, or significant intracranial injury. No acute calvarial abnormality. Visualized paranasal sinuses and mastoids clear. Orbital soft tissues unremarkable. IMPRESSION: No acute intracranial abnormality. Atrophy, chronic microvascular disease. Electronically Signed   By: Rolm Baptise M.D.   On: 09/04/2015 18:41      Dg Chest Portable 1 View  09/14/2015  CLINICAL DATA:  Altered mental status.  Rapid respirations. EXAM: PORTABLE CHEST 1 VIEW COMPARISON:  08/11/2015 chest x-ray.  Chest CT 08/12/2015. FINDINGS: COPD with hyperinflation. Normal heart size. LEFT lower lobe atelectasis. LEFT pleural effusion with or without consolidation. Worsening aeration from priors. Small nodules noted on previous chest CT not visible. IMPRESSION: LEFT lower lobe atelectasis with or without consolidation and LEFT pleural effusion. Slight worsening aeration from priors. Electronically Signed   By: Staci Righter M.D.   On: 09/17/2015 17:18       CBC  Recent Labs Lab 10/01/2015 1626 09/10/15 0747 09/11/15 1011 09/12/15 0521  WBC 24.6* 17.6* 19.6* PENDING  HGB 14.3 13.0 12.8* 13.0  HCT 44.4 43.6 40.2 41.7  PLT 341 PLATELET CLUMPS NOTED ON SMEAR, UNABLE TO ESTIMATE 221 PENDING  MCV 91.5 94.2 87.8 92.5  MCH 29.5 28.1 27.9 28.8  MCHC 32.2 29.8* 31.8 31.2  RDW 15.8* 19.0* 16.3* 17.1*  LYMPHSABS 1.0 0.9 1.2 PENDING  MONOABS 2.2* 1.4* 1.4* PENDING  EOSABS 0.0 1.6* 0.2 PENDING  BASOSABS 0.0 0.0 0.0 PENDING    Chemistries   Recent Labs Lab 09/07/2015 1626 09/28/2015 2355 09/10/15 1229 09/11/15 1011 09/12/15 0521  NA 147*  --  156* 155* 152*  K 4.0  --  3.2* 3.7 4.5  CL 106  --  122* 123* 119*  CO2 16*  --  15* 15* 15*  GLUCOSE 167*  --  150* 144* 163*  BUN 64*  --  62* 64* 70*  CREATININE 2.22*  --  1.79* 1.63* 1.95*  CALCIUM 10.6*  --  10.2 10.4* 10.3  MG  --  2.2  --  2.1  --   AST 35  --  63* 54* 33  ALT 15*  --  18 17 12*  ALKPHOS 199*  --  169* 245* 221*  BILITOT 1.4*  --  1.0 0.9 0.8     ------------------------------------------------------------------------------------------------------------------ No results for input(s): CHOL, HDL, LDLCALC, TRIG, CHOLHDL, LDLDIRECT in the last 72 hours.  Lab Results  Component Value Date   HGBA1C 5.2 08/12/2015   ------------------------------------------------------------------------------------------------------------------ No results for input(s): TSH, T4TOTAL, T3FREE, THYROIDAB in the last 72 hours.  Invalid input(s): FREET3 ------------------------------------------------------------------------------------------------------------------ No results for input(s): VITAMINB12, FOLATE, FERRITIN, TIBC, IRON, RETICCTPCT in the last 72 hours.  Coagulation profile No results for input(s): INR, PROTIME in the last 168 hours.  No results for input(s): DDIMER in the last 72 hours.  Cardiac Enzymes No results for input(s): CKMB, TROPONINI, MYOGLOBIN in the last 168 hours.  Invalid input(s): CK ------------------------------------------------------------------------------------------------------------------ No results found for: BNP  Time Spent in minutes   35   SINGH,PRASHANT K M.D on 09/12/2015 at 10:21 AM  Between 7am to 7pm - Pager - [phone]  After 7pm go to [email or web address] - password [email or web address]  [facility] -  Office  [phone]

## 2015-09-14 LAB — CULTURE, BLOOD (ROUTINE X 2)
CULTURE: NO GROWTH
Culture: NO GROWTH

## 2015-09-14 LAB — URINE CULTURE

## 2015-10-02 NOTE — Progress Notes (Signed)
Pt's morphine drip , 230 was wasted in the sink witnessed by josephine [employer]

## 2015-10-02 NOTE — Progress Notes (Signed)
Pt noted to be pulseless, no respiration at 0400 verified with another RN, pt is DNR, called mr. Lynnell Grain, pt's brother to inform ,stated that they will come to the hospital around [place],  funeral information obtained, text page K. Schorr to inform, [place] donor called as well, pt is not suitable for organ donation.

## 2015-10-02 NOTE — Discharge Summary (Signed)
[facility] Death Note                                                                                                                                                                                                Death Note please see Last Note for all details.   In breif -    Toraino Scopel [id number] is a 82 y.o. male, Outpatient Primary MD for the patient is No primary care provider on file.  Pronounced dead by RN on Sep 24, 2015 at 4am                 Cause of death Sepsis from Fournier's gangrene, was seen by Urology, PCCM and Pall.Care, was deemed to be a non operative candidate upon evaluation by Urology and PCCM, was kept on Iv ABX per family wishes with Comfort Meds under the guidance of Pall. Care, remained obtunded throughout was DNR, passed away in comfort.  Signature  Thurnell Lose M.D on 09/24/2015 at 9:01 AM  [facility]  Office Phone -[phone]  Total clinical and documentation time for today Under 30 minutes   Last Note                                                                                                                                                        Patient Demographics:    Nylan Hull, is a 82 y.o. male, DOB - 28-Apr-1927, [id number]  Admit date - 09/15/2015   Admitting Physician Reubin Milan, MD  Outpatient Primary MD for the patient is No primary care provider on file.  LOS - 4   Chief Complaint  Patient presents with  . Altered Mental Status        Subjective:    [name] today in bed, obtunded and unable to answer questions.   Assessment  & Plan :     1. Severe sepsis with acute renal failure. After detailed examination cause  found to be scrotal abscess with possible Fournier's gangrene. Have consulted PCCM along with urology, Med Rx only, per PCCM poor  candidate for surgery. Discussed his poor prognosis with his brother who is the power of attorney. Told him clearly that patient might die in the OR and otherwise. Of note patient has prostate cancer and his quality of life is poor to begin with. Patient's brother the power of attorney & in discussion with patient's son has now decided Med Rx and DNR, Pall care following.  On 09/12/2015 there is no clinical improvement, he continues to be obtunded, discussed this with patient's brother who is the power of attorney. Encouraged him to think about full comfort care at this time. For now continue IV antibiotics. Palliative care on board.   2. Acute respiratory failure secondary to sepsis and metabolic acidosis requiring compensation. Continue nonrebreather mask. Continue supportive care.   3. Hypernatremia, dehydration and ARF on CK D stage III. Baseline creatinine 1.6, worse due to sepsis, hydrate and monitor. Nonacute renal ultrasound .   4. Hypoglycemia. Due to sepsis. For now supportive care for sepsis along with D5W drip.   5. History of prostate cancer with metastasis to bone, adrenals, chronic left hydronephrosis. Poor prognosis. Once stable follow with primary care physician and urologist in the outpatient setting.   6. Toxic encephalopathy due to #1 above. Head CT nonacute. Supportive care.    7. Essential hypertension. Blood pressure soft due to sepsis. IV fluids, hold blood pressure medications.    8. Metabolic acidosis due to elevated lactate from sepsis. Supportive care and hydration.    9. Elevated LFTs. Also likely reactionary to sepsis. No right upper quadrant tenderness. Right upper quadrant ultrasound stable from a hepatic standpoint, continue to trend LFTs with supportive care.    Code Status : DO NOT RESUSCITATE  Family Communication  : Discussed with his brother, currently DO NOT RESUSCITATE, described to him poor prognosis, he will discuss patient's case with  patient's son and call us back about any potential surgery. Clearly described to him that patient might pass away during surgery.  Disposition Plan  : Stay in stepdown  Consults  :  PCCM, urology  Procedures  :   CT head nonacute.  Ordered stat scrotal ultrasound along with renal ultrasound at 8 AM on 09/10/2015  DVT Prophylaxis  :  Heparin ordered  Lab Results  Component Value Date   PLT 226 09/12/2015    Inpatient Medications  Scheduled Meds: . ampicillin-sulbactam (UNASYN) IV  1.5 g Intravenous Q12H  . LORazepam  0.5 mg Intravenous Q12H   Continuous Infusions: . morphine 1 mg/hr (09/12/15 1642)   PRN Meds:.glycopyrrolate, ipratropium-albuterol, LORazepam, morphine injection  Antibiotics  :     Anti-infectives    Start     Dose/Rate Route Frequency Ordered Stop   09/12/15 1400  ampicillin-sulbactam (UNASYN) 1.5 g in sodium chloride 0.9 % 50 mL IVPB     1.5 g 100 mL/hr over 30 Minutes Intravenous Every 12 hours 09/12/15 0935     09/10/15 1700  vancomycin (VANCOCIN) IVPB 750 mg/150 ml premix  Status:  Discontinued     750 mg 150 mL/hr over 60 Minutes Intravenous Every 24 hours 09/15/2015 1734 09/12/15 0926   09/10/15 1700  piperacillin-tazobactam (ZOSYN) IVPB 3.375 g  Status:  Discontinued     3.375 g 12.5 mL/hr over 240 Minutes Intravenous 3 times per day 09/12/2015 1734 09/12/15 0926   09/20/2015 1645  piperacillin-tazobactam (ZOSYN) IVPB 3.375 g  3.375 g 100 mL/hr over 30 Minutes Intravenous  Once 09/14/2015 1637 09/27/2015 1728   09/28/2015 1645  vancomycin (VANCOCIN) IVPB 1000 mg/200 mL premix  Status:  Discontinued     1,000 mg 200 mL/hr over 60 Minutes Intravenous  Once 09/15/2015 1637 09/06/2015 1640   09/11/2015 1645  vancomycin (VANCOCIN) 1,500 mg in sodium chloride 0.9 % 500 mL IVPB     1,500 mg 250 mL/hr over 120 Minutes Intravenous  Once 09/28/2015 1641 09/16/2015 1912        Objective:   Filed Vitals:   09/12/15 0743 09/12/15 0800 09/12/15 0950 09/12/15 1446  BP:    102/48   Pulse:  74 74 72  Temp: 96.6 F (35.9 C)  96.6 F (35.9 C)   TempSrc: Axillary  Axillary   Resp:  32 29 36  Height:      Weight:      SpO2:  100% 94%     Wt Readings from Last 3 Encounters:  09/08/2015 74.6 kg (164 lb 7.4 oz)  08/16/15 79.969 kg (176 lb 4.8 oz)  04/01/15 103.4 kg (227 lb 15.3 oz)     Intake/Output Summary (Last 24 hours) at Oct 01, 2015 0901 Last data filed at 09/12/15 1900  Gross per 24 hour  Intake   52.3 ml  Output    300 ml  Net -247.7 ml     Physical Exam  Patient is obtunded and unable to move his extremities to verbal commands, does withdraw to pain, [place].AT,PERRAL Supple Neck,No JVD, No cervical lymphadenopathy appriciated.  Symmetrical Chest wall movement, Good air movement bilaterally, CTAB RRR,No Gallops,Rubs or new Murmurs, No Parasternal Heave +ve B.Sounds, Abd Soft, No tenderness, No organomegaly appriciated, No rebound - guarding or rigidity. Swollen and gangrenous looking scrotum with foul smell No Cyanosis, Clubbing or edema, No new Rash or bruise       Data Review:   Micro Results Recent Results (from the past 240 hour(s))  Blood Culture (routine x 2)     Status: None (Preliminary result)   Collection Time: 09/23/2015  4:26 PM  Result Value Ref Range Status   Specimen Description BLOOD RIGHT ANTECUBITAL  Final   Special Requests BOTTLES DRAWN AEROBIC AND ANAEROBIC 5CC  Final   Culture NO GROWTH 3 DAYS  Final   Report Status PENDING  Incomplete  Blood Culture (routine x 2)     Status: None (Preliminary result)   Collection Time: 09/04/2015  4:47 PM  Result Value Ref Range Status   Specimen Description BLOOD RIGHT ARM  Final   Special Requests BOTTLES DRAWN AEROBIC ONLY [place]  Final   Culture NO GROWTH 3 DAYS  Final   Report Status PENDING  Incomplete  MRSA PCR Screening     Status: None   Collection Time: 09/10/15 12:53 AM  Result Value Ref Range Status   MRSA by PCR NEGATIVE NEGATIVE Final    Comment:        The GeneXpert  MRSA Assay (FDA approved for NASAL specimens only), is one component of a comprehensive MRSA colonization surveillance program. It is not intended to diagnose MRSA infection nor to guide or monitor treatment for MRSA infections.   Urine culture     Status: None (Preliminary result)   Collection Time: 09/10/15  5:16 AM  Result Value Ref Range Status   Specimen Description URINE, RANDOM  Final   Special Requests NONE  Final   Culture   Final    >=100,000 COLONIES/mL PSEUDOMONAS AERUGINOSA >=100,000 COLONIES/mL ENTEROCOCCUS  SPECIES    Report Status PENDING  Incomplete    Radiology Reports     Ct Head Wo Contrast  09/12/2015  CLINICAL DATA:  Decreased level of consciousness. Decreased O2 sats. Tachypnea, tachycardia. EXAM: CT HEAD WITHOUT CONTRAST TECHNIQUE: Contiguous axial images were obtained from the base of the skull through the vertex without intravenous contrast. COMPARISON:  08/13/2015 FINDINGS: There is atrophy and chronic small vessel disease changes. No acute intracranial abnormality. Specifically, no hemorrhage, hydrocephalus, mass lesion, acute infarction, or significant intracranial injury. No acute calvarial abnormality. Visualized paranasal sinuses and mastoids clear. Orbital soft tissues unremarkable. IMPRESSION: No acute intracranial abnormality. Atrophy, chronic microvascular disease. Electronically Signed   By: Rolm Baptise M.D.   On: 09/21/2015 18:41      Dg Chest Portable 1 View  09/12/2015  CLINICAL DATA:  Altered mental status.  Rapid respirations. EXAM: PORTABLE CHEST 1 VIEW COMPARISON:  08/11/2015 chest x-ray.  Chest CT 08/12/2015. FINDINGS: COPD with hyperinflation. Normal heart size. LEFT lower lobe atelectasis. LEFT pleural effusion with or without consolidation. Worsening aeration from priors. Small nodules noted on previous chest CT not visible. IMPRESSION: LEFT lower lobe atelectasis with or without consolidation and LEFT pleural effusion. Slight worsening  aeration from priors. Electronically Signed   By: Staci Righter M.D.   On: 09/24/2015 17:18       CBC  Recent Labs Lab 09/29/2015 1626 09/10/15 0747 09/11/15 1011 09/12/15 0521  WBC 24.6* 17.6* 19.6* 24.9*  HGB 14.3 13.0 12.8* 13.0  HCT 44.4 43.6 40.2 41.7  PLT 341 PLATELET CLUMPS NOTED ON SMEAR, UNABLE TO ESTIMATE 221 226  MCV 91.5 94.2 87.8 92.5  MCH 29.5 28.1 27.9 28.8  MCHC 32.2 29.8* 31.8 31.2  RDW 15.8* 19.0* 16.3* 17.1*  LYMPHSABS 1.0 0.9 1.2 1.2  MONOABS 2.2* 1.4* 1.4* 1.7*  EOSABS 0.0 1.6* 0.2 0.0  BASOSABS 0.0 0.0 0.0 0.0    Chemistries   Recent Labs Lab 09/30/2015 1626 09/07/2015 2355 09/10/15 1229 09/11/15 1011 09/12/15 0521  NA 147*  --  156* 155* 152*  K 4.0  --  3.2* 3.7 4.5  CL 106  --  122* 123* 119*  CO2 16*  --  15* 15* 15*  GLUCOSE 167*  --  150* 144* 163*  BUN 64*  --  62* 64* 70*  CREATININE 2.22*  --  1.79* 1.63* 1.95*  CALCIUM 10.6*  --  10.2 10.4* 10.3  MG  --  2.2  --  2.1  --   AST 35  --  63* 54* 33  ALT 15*  --  18 17 12*  ALKPHOS 199*  --  169* 245* 221*  BILITOT 1.4*  --  1.0 0.9 0.8   ------------------------------------------------------------------------------------------------------------------ No results for input(s): CHOL, HDL, LDLCALC, TRIG, CHOLHDL, LDLDIRECT in the last 72 hours.  Lab Results  Component Value Date   HGBA1C 5.2 08/12/2015   ------------------------------------------------------------------------------------------------------------------ No results for input(s): TSH, T4TOTAL, T3FREE, THYROIDAB in the last 72 hours.  Invalid input(s): FREET3 ------------------------------------------------------------------------------------------------------------------ No results for input(s): VITAMINB12, FOLATE, FERRITIN, TIBC, IRON, RETICCTPCT in the last 72 hours.  Coagulation profile No results for input(s): INR, PROTIME in the last 168 hours.  No results for input(s): DDIMER in the last 72 hours.  Cardiac  Enzymes No results for input(s): CKMB, TROPONINI, MYOGLOBIN in the last 168 hours.  Invalid input(s): CK ------------------------------------------------------------------------------------------------------------------ No results found for: BNP  Time Spent in minutes   35   Zakariyah Freimark K M.D on 09/25/15 at 9:01 AM  Between 7am to 7pm - Pager - [phone]  After 7pm go to [email or web address] - password [facility]  [facility] -  Office  [phone]

## 2015-10-02 DEATH — deceased

## 2016-12-24 IMAGING — CT CT CHEST W/O CM
2 of 4 series · 14 of 36 positions shown, 17 images · non-contrast
Comparison: 08/11/2015 and 07/03/2025 chest x-ray

CLINICAL DATA: 88-year-old male with failure to thrive. Prostate
cancer. Pleural effusion.

EXAM:
CT CHEST WITHOUT CONTRAST
TECHNIQUE: Multidetector CT imaging of the chest was performed following the
standard protocol without IV contrast.

[Series 4: chest w/o 3mm st cor · coronal · non-contrast · 0.69mm/px · 3 of 101 slices shown]
[im 21/101  lung]
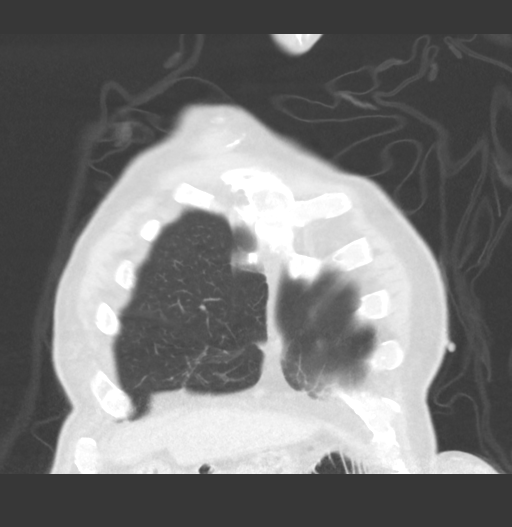
[im 41/101  lung]
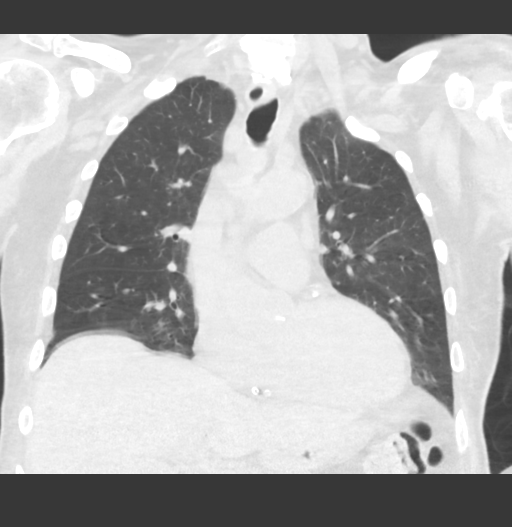
[im 61/101  lung]
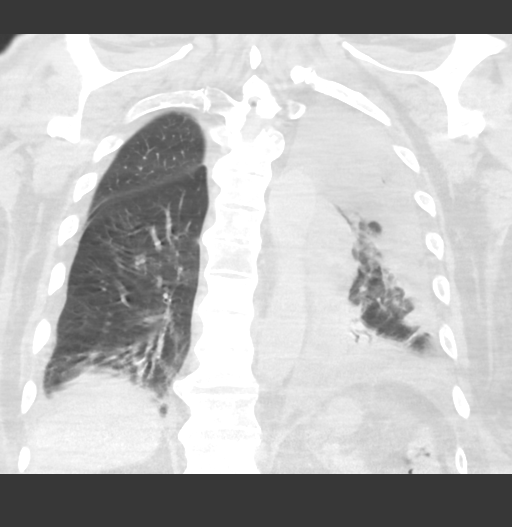

[Series 6: chest w/o 1mm st · axial · non-contrast · 0.83mm/px · z∈[+1100,+1360]mm · 11 of 376 slices shown, 14 images]
[im 33/376  mediastinal]
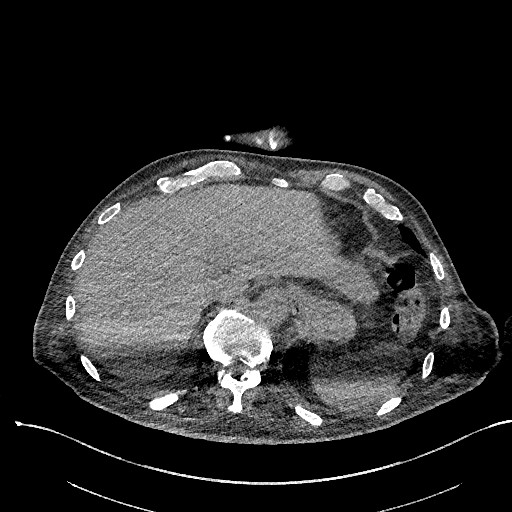
[im 33/376  lung]
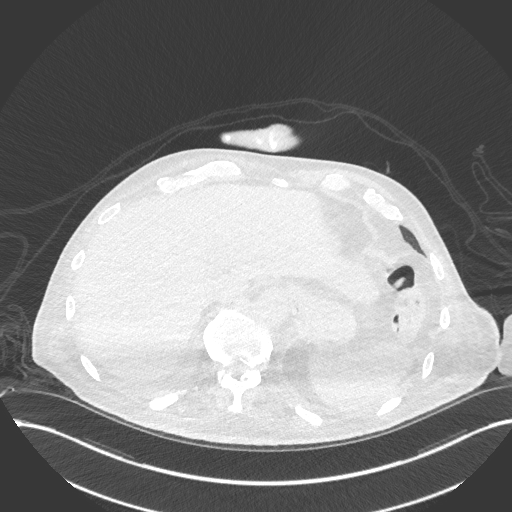
[im 66/376  lung]
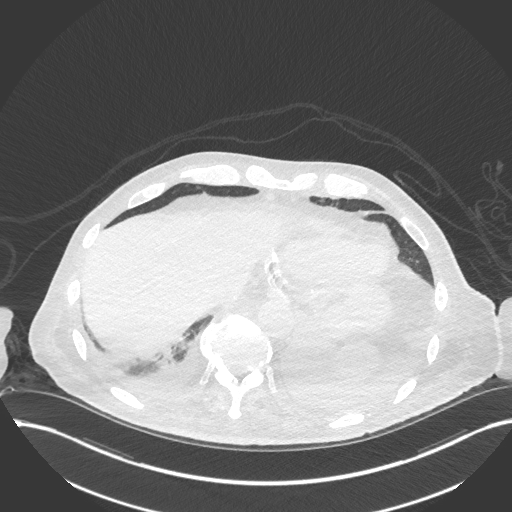
[im 98/376  lung]
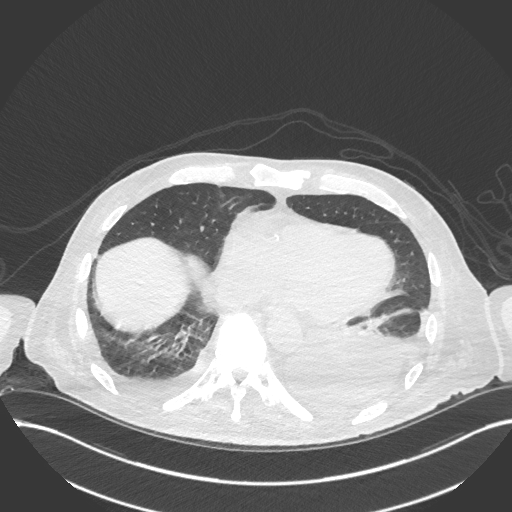
[im 131/376  lung]
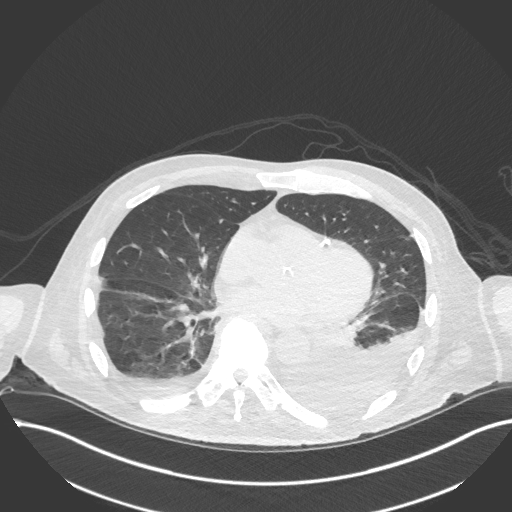
[im 164/376  mediastinal]
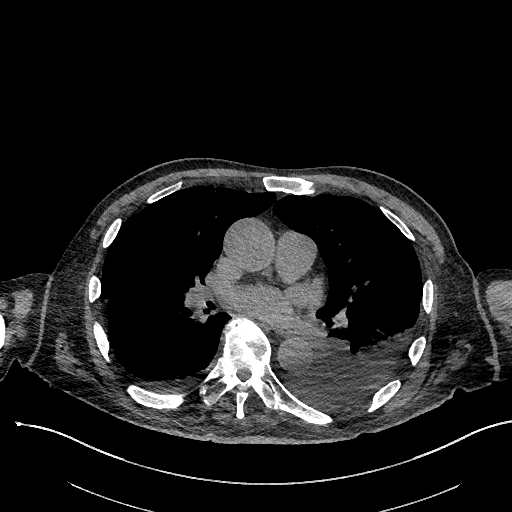
[im 164/376  lung]
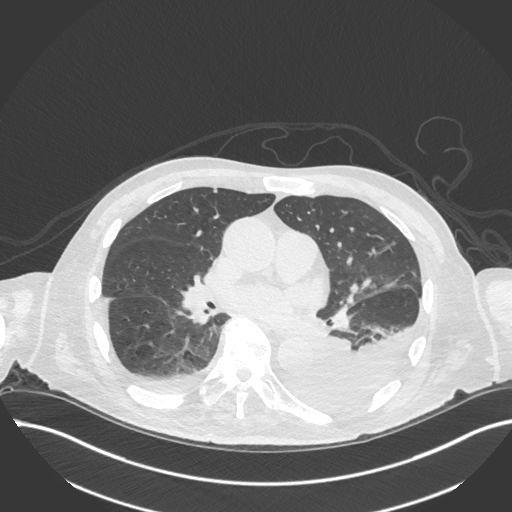
[im 196/376  lung]
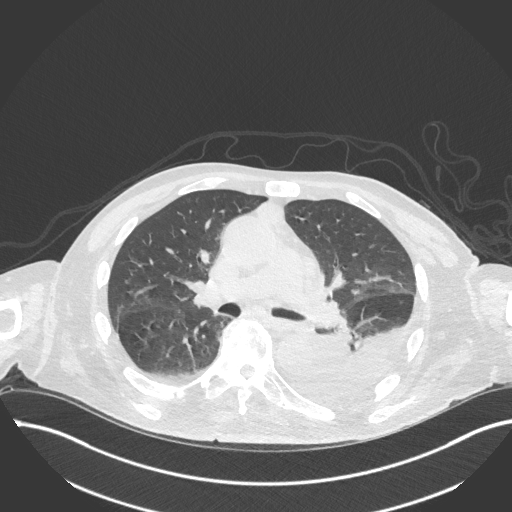
[im 229/376  lung]
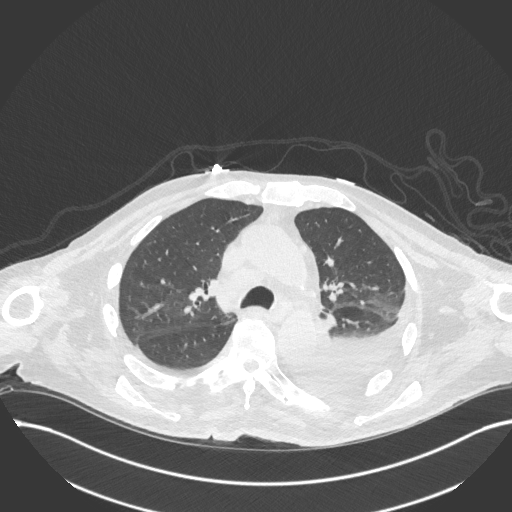
[im 261/376  lung]
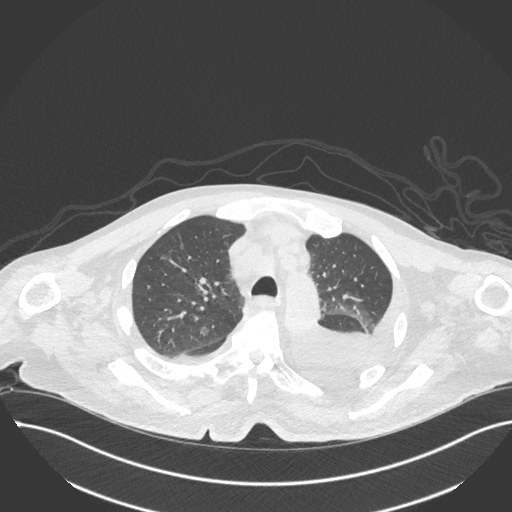
[im 294/376  mediastinal]
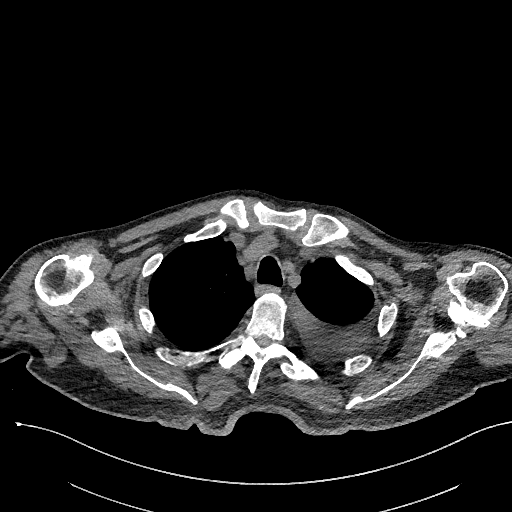
[im 294/376  lung]
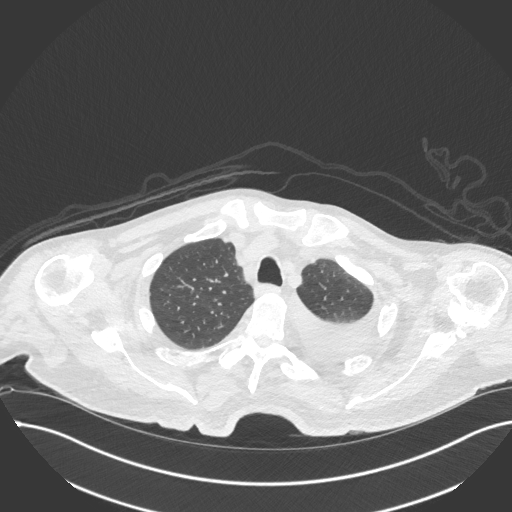
[im 327/376  lung]
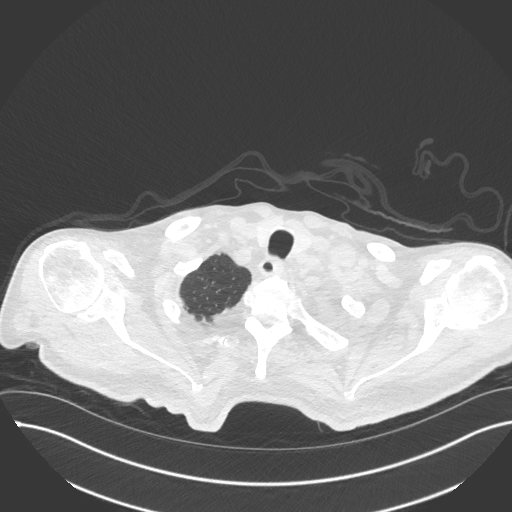
[im 359/376  lung]
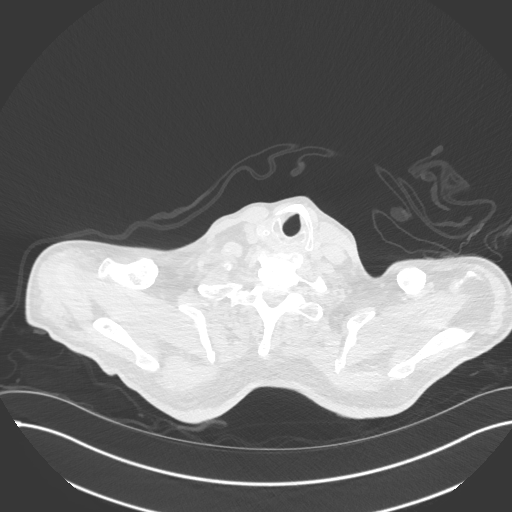

[14 of 36 positions shown; findings below may reference images not displayed]

FINDINGS: Moderate-size left-sided pleural effusion. Consolidation left lower
lobe. No obstructing lesion of the bronchi of the left lower lobe
and therefore this may represent passive atelectasis or infiltrate.

Small right sided pleural effusion.

Anterior right upper lobe and right middle lobe tiny nodules with
suggestion of small calcifications may represent small granulomas
although metastatic disease not excluded. Noncalcified left upper
lobe 3 mm nodule of indeterminate etiology, metastatic disease not
excluded.

Mediastinal adenopathy. Index lymph nodes pretracheal region with
transverse dimension of 2.6 x 1.9 cm and 2.7 x 2.4 cm. Preaortic
adenopathy measures 2.3 x 1.9 cm. Retrocrural adenopathy measures
1.8 x 1.7 cm.

Diffuse osseous metastatic disease. Sclerotic metastatic foci seen
throughout the lower cervical spine and throughout the thoracic
spine upper lumbar spine. At the T9 level, circumferential soft
tissue component tumor with possibly of epidural spread on the
right. There may be epidural spread of tumor on the right at the T8
level and T6 level. Thoracic spine MR would be necessary for further
delineation.

Multiple metastatic rib lesions most destructive involving the
posterior left sixth rib.

Findings suspicious for 4.6 x 6.1 cm liver lesion although
incompletely assessed on the present unenhanced CT chest.

Mild enlargement of the right adrenal gland may represent
hyperplasia or spread of tumor.

Marked coronary artery calcifications. Heart size top-normal to
slightly enlarged.

Ectasia ascending thoracic aorta measuring up to 4.2 cm.
IMPRESSION: Moderate-size left-sided pleural effusion. Consolidation left lower
lobe. No obstructing lesion of the bronchi of the left lower lobe
and therefore this may represent passive atelectasis or infiltrate.

Small right sided pleural effusion.

Anterior right upper lobe and right middle lobe tiny nodules with
suggestion of small calcifications may represent small granulomas
although metastatic disease not excluded. Noncalcified left upper
lobe 3 mm nodule of indeterminate etiology, metastatic disease not
excluded.

Mediastinal adenopathy.  Retrocrural adenopathy.

Diffuse osseous metastatic disease. Sclerotic metastatic foci seen
throughout the lower cervical spine and throughout the thoracic
spine upper lumbar spine. At the T9 level, circumferential soft
tissue component tumor with possibly of epidural spread on the
right. There may be epidural spread of tumor on the right at the T8
level and T6 level. Thoracic spine MR would be necessary for further
delineation.

Multiple metastatic rib lesions most destructive involving the
posterior left sixth rib.

Findings suspicious for 4.6 x 6.1 cm liver lesion although
incompletely assessed on the present unenhanced CT chest.

Mild enlargement of the right adrenal gland may represent
hyperplasia or spread of tumor.

Marked coronary artery calcifications. Heart size top-normal to
slightly enlarged.

Ectasia ascending thoracic aorta measuring up to 4.2 cm.

## 2016-12-25 IMAGING — CT CT HEAD W/O CM
1 of 2 series · 13 of 30 positions shown, 17 images · non-contrast
Comparison: None.

CLINICAL DATA: Prostate cancer. Altered mental status. Evaluate for
brain Mets.

EXAM:
CT HEAD WITHOUT CONTRAST
TECHNIQUE: Contiguous axial images were obtained from the base of the skull
through the vertex without intravenous contrast.

[Series 3: head 5.0 h30s · axial · 0.45mm/px · z∈[-128,+7]mm · 13 of 33 slices shown, 17 images]
[im 3/33  brain]
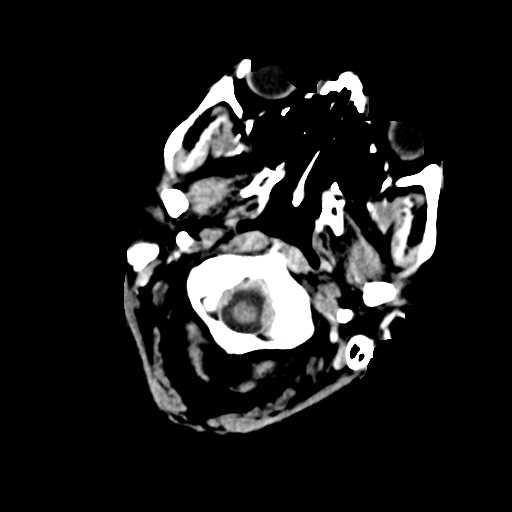
[im 3/33  bone]
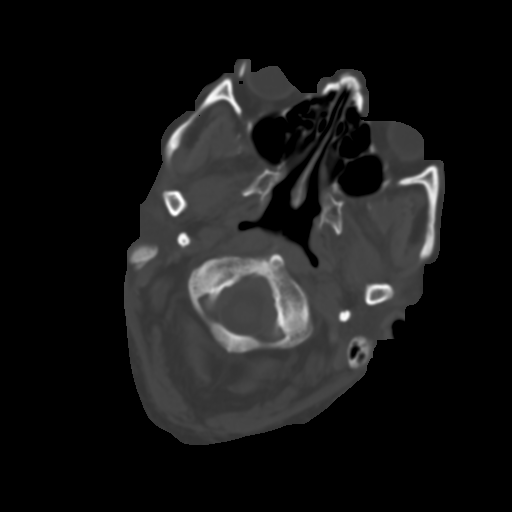
[im 5/33  brain]
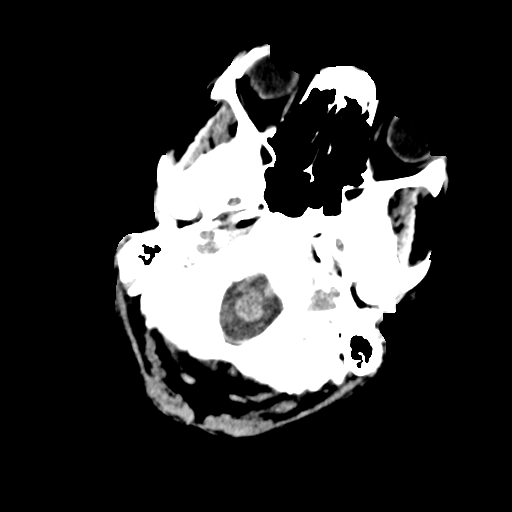
[im 7/33  brain]
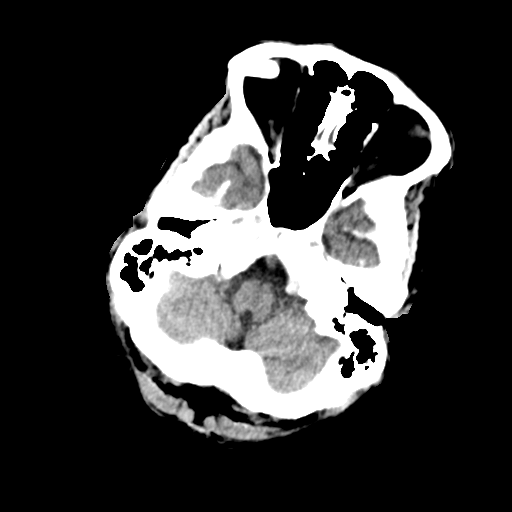
[im 10/33  brain]
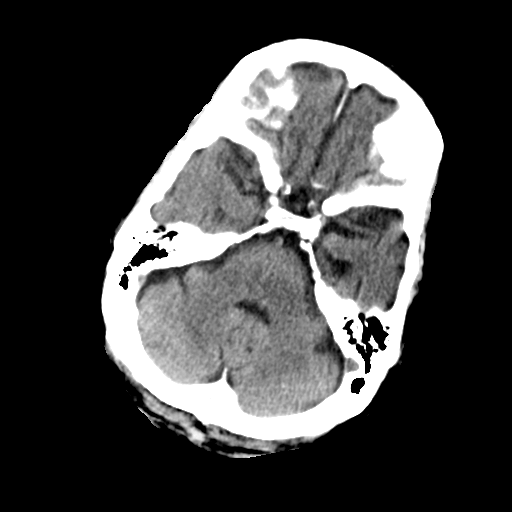
[im 12/33  brain]
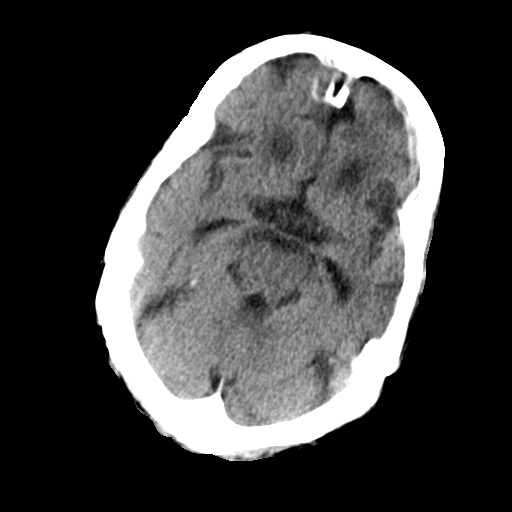
[im 12/33  bone]
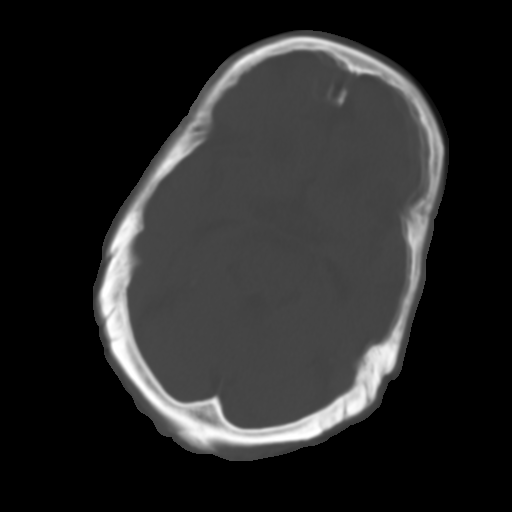
[im 14/33  brain]
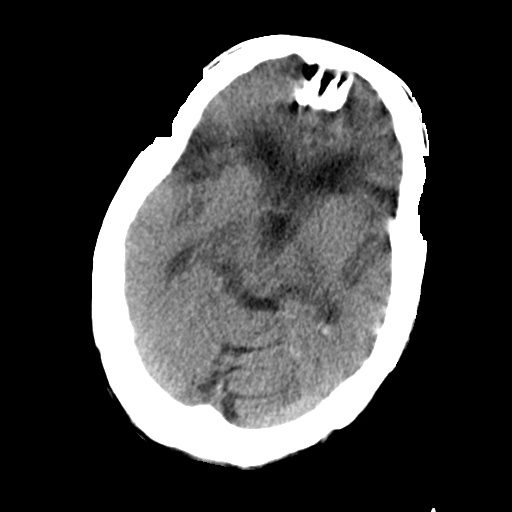
[im 17/33  brain]
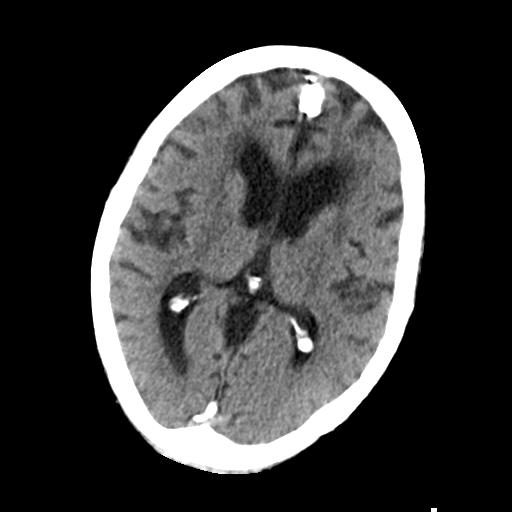
[im 19/33  brain]
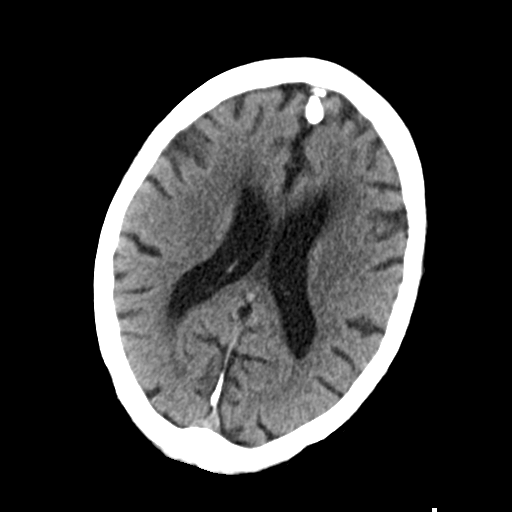
[im 21/33  brain]
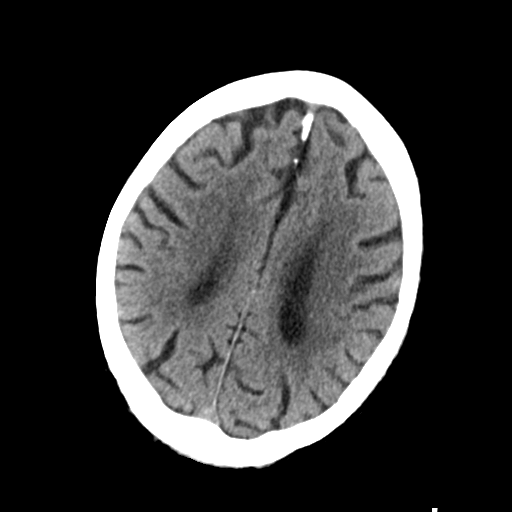
[im 21/33  bone]
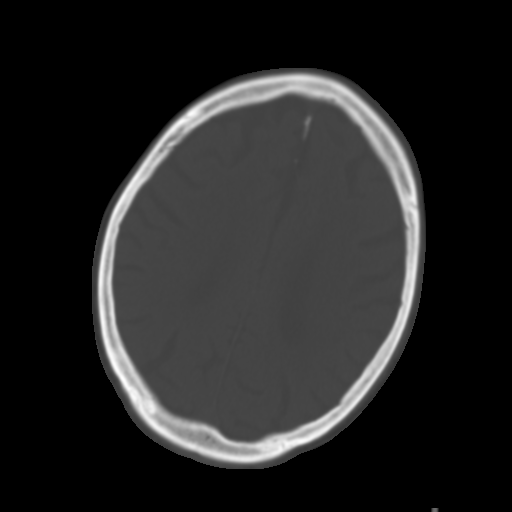
[im 23/33  brain]
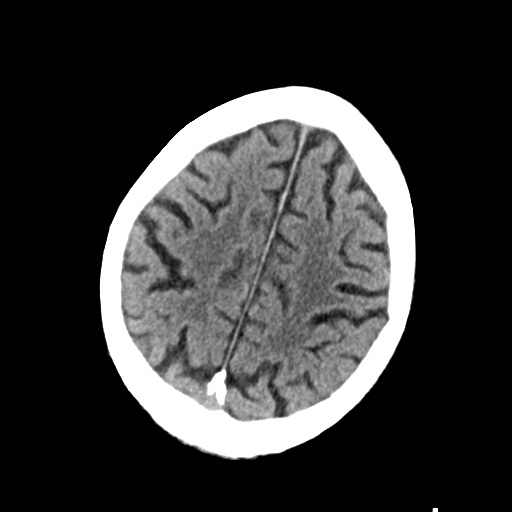
[im 26/33  brain]
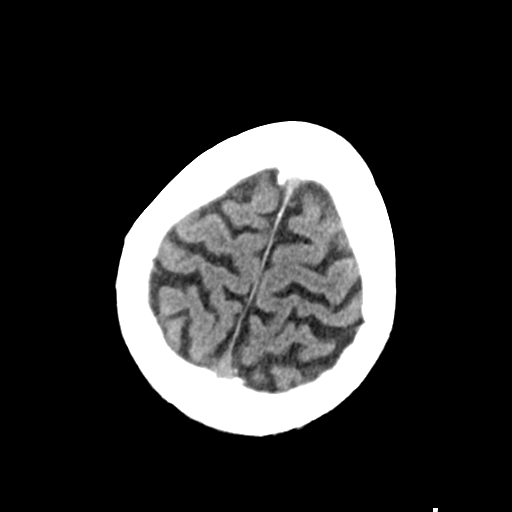
[im 28/33  brain]
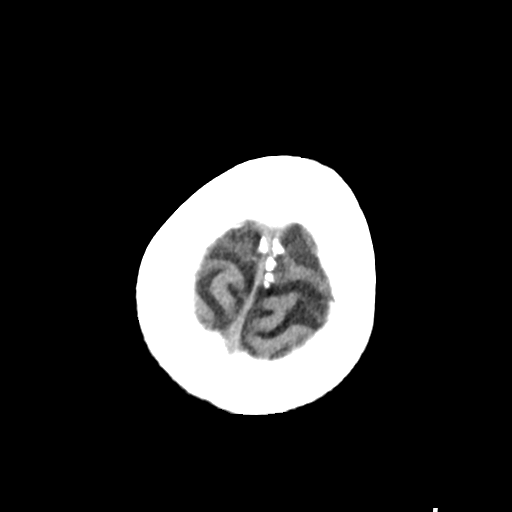
[im 30/33  brain]
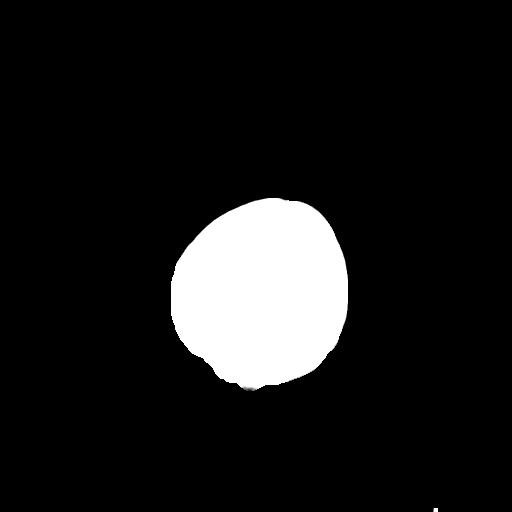
[im 30/33  bone]
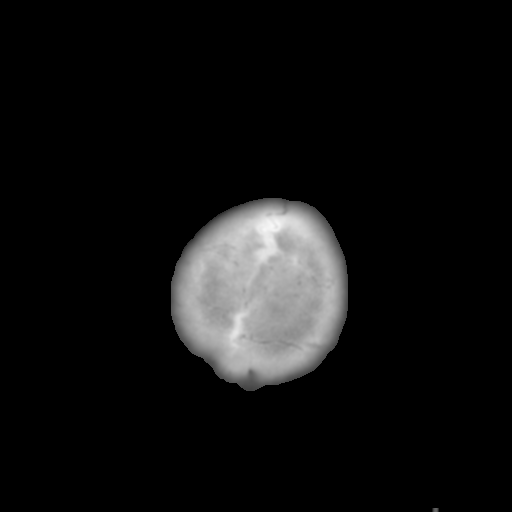

[13 of 30 positions shown; findings below may reference images not displayed]

FINDINGS: There is low attenuation throughout the subcortical and
periventricular white matter compatible with chronic microvascular
disease. There is prominence of the sulci and the ventricles
consistent with brain atrophy. There is no abnormal extra-axial
fluid collection, intracranial hemorrhage or mass. The mastoid air
cells are clear. The paranasal sinuses are clear.
IMPRESSION: 1. No acute intracranial abnormalities.  No evidence for brain mass.
2. Small vessel ischemic disease and brain atrophy.
